# Patient Record
Sex: Male | Born: 1970 | Race: Black or African American | Hispanic: No | Marital: Single | Smoking: Never smoker
Health system: Southern US, Community
[De-identification: ages and names within clinical notes are randomized; demographics above are authoritative.]

## PROBLEM LIST (undated history)

## (undated) DIAGNOSIS — I255 Ischemic cardiomyopathy: Secondary | ICD-10-CM

## (undated) DIAGNOSIS — E785 Hyperlipidemia, unspecified: Secondary | ICD-10-CM

## (undated) DIAGNOSIS — I2510 Atherosclerotic heart disease of native coronary artery without angina pectoris: Secondary | ICD-10-CM

## (undated) DIAGNOSIS — R7301 Impaired fasting glucose: Secondary | ICD-10-CM

## (undated) DIAGNOSIS — I10 Essential (primary) hypertension: Secondary | ICD-10-CM

## (undated) DIAGNOSIS — R7303 Prediabetes: Secondary | ICD-10-CM

## (undated) HISTORY — PX: CARDIAC CATHETERIZATION: SHX172

## (undated) HISTORY — DX: Prediabetes: R73.03

---

## 2010-05-21 ENCOUNTER — Inpatient Hospital Stay (HOSPITAL_COMMUNITY)
Admission: EM | Admit: 2010-05-21 | Discharge: 2010-05-25 | DRG: 854 | Disposition: A | Discharge status: Home / Self Care | Payer: BC Managed Care – PPO | Attending: Cardiovascular Disease | Admitting: Cardiovascular Disease

## 2010-05-21 ENCOUNTER — Inpatient Hospital Stay (HOSPITAL_COMMUNITY): Payer: BC Managed Care – PPO

## 2010-05-21 DIAGNOSIS — R509 Fever, unspecified: Secondary | ICD-10-CM | POA: Diagnosis not present

## 2010-05-21 DIAGNOSIS — E785 Hyperlipidemia, unspecified: Secondary | ICD-10-CM | POA: Diagnosis present

## 2010-05-21 DIAGNOSIS — R7989 Other specified abnormal findings of blood chemistry: Secondary | ICD-10-CM | POA: Diagnosis present

## 2010-05-21 DIAGNOSIS — I2510 Atherosclerotic heart disease of native coronary artery without angina pectoris: Secondary | ICD-10-CM | POA: Diagnosis present

## 2010-05-21 DIAGNOSIS — I441 Atrioventricular block, second degree: Secondary | ICD-10-CM | POA: Diagnosis present

## 2010-05-21 DIAGNOSIS — I10 Essential (primary) hypertension: Secondary | ICD-10-CM | POA: Diagnosis present

## 2010-05-21 DIAGNOSIS — I2119 ST elevation (STEMI) myocardial infarction involving other coronary artery of inferior wall: Principal | ICD-10-CM | POA: Diagnosis present

## 2010-05-21 LAB — COMPREHENSIVE METABOLIC PANEL
ALT: 43 U/L (ref 0–53)
AST: 38 U/L — ABNORMAL HIGH (ref 0–37)
Albumin: 3.8 g/dL (ref 3.5–5.2)
Alkaline Phosphatase: 76 U/L (ref 39–117)
GFR calc Af Amer: >60 mL/min (ref >60)
Glucose, Bld: 166 mg/dL — ABNORMAL HIGH (ref 70–99)
Potassium: 3.1 mEq/L — ABNORMAL LOW (ref 3.5–5.1)
Sodium: 138 mEq/L (ref 135–145)
Total Protein: 6.7 g/dL (ref 6.0–8.3)

## 2010-05-21 LAB — MRSA PCR SCREENING: MRSA by PCR: NEGATIVE

## 2010-05-21 LAB — CBC
HCT: 43.5 % (ref 39.0–52.0)
Hemoglobin: 14.7 g/dL (ref 13.0–17.0)
MCHC: 33.8 g/dL (ref 30.0–36.0)

## 2010-05-21 LAB — POCT I-STAT, CHEM 8
Calcium, Ion: 1.04 mmol/L — ABNORMAL LOW (ref 1.12–1.32)
Chloride: 107 mEq/L (ref 96–112)
Glucose, Bld: 172 mg/dL — ABNORMAL HIGH (ref 70–99)
HCT: 46 % (ref 39.0–52.0)
Hemoglobin: 15.6 g/dL (ref 13.0–17.0)

## 2010-05-21 LAB — HEMOGLOBIN A1C
Hgb A1c MFr Bld: 5.9 % — ABNORMAL HIGH (ref <5.7)
Mean Plasma Glucose: 123 mg/dL — ABNORMAL HIGH (ref <117)

## 2010-05-21 LAB — POCT CARDIAC MARKERS: Troponin i, poc: <0.05 ng/mL (ref 0.00–0.09)

## 2010-05-21 LAB — PROTIME-INR: INR: 1.06 (ref 0.00–1.49)

## 2010-05-21 LAB — MAGNESIUM: Magnesium: 1.6 mg/dL (ref 1.5–2.5)

## 2010-05-21 LAB — CK TOTAL AND CKMB (NOT AT ARMC): Total CK: 1519 U/L — ABNORMAL HIGH (ref 7–232)

## 2010-05-22 LAB — CARDIAC PANEL(CRET KIN+CKTOT+MB+TROPI)
CK, MB: 233.7 ng/mL (ref 0.3–4.0)
Relative Index: 9.8 — ABNORMAL HIGH (ref 0.0–2.5)
Total CK: 4108 U/L — ABNORMAL HIGH (ref 7–232)
Total CK: 3234 U/L — ABNORMAL HIGH (ref 7–232)
Total CK: 2338 U/L — ABNORMAL HIGH (ref 7–232)
Troponin I: 20.13 ng/mL (ref <0.30)

## 2010-05-22 LAB — CBC
HCT: 39.3 % (ref 39.0–52.0)
Hemoglobin: 13.3 g/dL (ref 13.0–17.0)
MCH: 27.2 pg (ref 26.0–34.0)
MCHC: 33.8 g/dL (ref 30.0–36.0)
RBC: 4.89 MIL/uL (ref 4.22–5.81)

## 2010-05-22 LAB — BASIC METABOLIC PANEL
BUN: 11 mg/dL (ref 6–23)
Chloride: 106 mEq/L (ref 96–112)
Creatinine, Ser: 0.84 mg/dL (ref 0.4–1.5)
GFR calc Af Amer: >60 mL/min (ref >60)
GFR calc non Af Amer: >60 mL/min (ref >60)
Potassium: 4.8 mEq/L (ref 3.5–5.1)

## 2010-05-22 LAB — URINALYSIS, ROUTINE W REFLEX MICROSCOPIC
Bilirubin Urine: NEGATIVE
Ketones, ur: NEGATIVE mg/dL
Nitrite: NEGATIVE
Urobilinogen, UA: 0.2 mg/dL (ref 0.0–1.0)

## 2010-05-22 LAB — TSH: TSH: 1.135 u[IU]/mL (ref 0.350–4.500)

## 2010-05-22 LAB — PRO B NATRIURETIC PEPTIDE: Pro B Natriuretic peptide (BNP): 32.8 pg/mL (ref 0–125)

## 2010-05-22 LAB — LIPID PANEL
Cholesterol: 260 mg/dL — ABNORMAL HIGH (ref 0–200)
LDL Cholesterol: 194 mg/dL — ABNORMAL HIGH (ref 0–99)
Total CHOL/HDL Ratio: 5.7 RATIO

## 2010-05-22 LAB — POCT ACTIVATED CLOTTING TIME: Activated Clotting Time: 411 seconds

## 2010-05-23 ENCOUNTER — Inpatient Hospital Stay (HOSPITAL_COMMUNITY): Payer: BC Managed Care – PPO

## 2010-05-23 LAB — COMPREHENSIVE METABOLIC PANEL
ALT: 55 U/L — ABNORMAL HIGH (ref 0–53)
AST: 134 U/L — ABNORMAL HIGH (ref 0–37)
Albumin: 3.4 g/dL — ABNORMAL LOW (ref 3.5–5.2)
Alkaline Phosphatase: 42 U/L (ref 39–117)
Chloride: 104 mEq/L (ref 96–112)
GFR calc Af Amer: >60 mL/min (ref >60)
Potassium: 4 mEq/L (ref 3.5–5.1)
Total Bilirubin: 0.4 mg/dL (ref 0.3–1.2)

## 2010-05-23 LAB — URINALYSIS, ROUTINE W REFLEX MICROSCOPIC
Hgb urine dipstick: NEGATIVE
Protein, ur: NEGATIVE mg/dL
Urobilinogen, UA: 1 mg/dL (ref 0.0–1.0)

## 2010-05-23 LAB — CBC
HCT: 43.2 % (ref 39.0–52.0)
MCHC: 33.8 g/dL (ref 30.0–36.0)
RDW: 12.5 % (ref 11.5–15.5)

## 2010-05-23 LAB — DIFFERENTIAL
Basophils Absolute: 0 10*3/uL (ref 0.0–0.1)
Basophils Relative: 0 % (ref 0–1)
Eosinophils Relative: 1 % (ref 0–5)
Monocytes Absolute: 0.7 10*3/uL (ref 0.1–1.0)

## 2010-05-24 LAB — CBC
MCH: 27.2 pg (ref 26.0–34.0)
MCV: 79.2 fL (ref 78.0–100.0)
Platelets: 177 10*3/uL (ref 150–400)
RDW: 12.2 % (ref 11.5–15.5)
WBC: 6.2 10*3/uL (ref 4.0–10.5)

## 2010-05-24 LAB — URINE CULTURE

## 2010-05-24 NOTE — Cardiovascular Report (Signed)
NAME:  Russell, Baldwin NO.:  [id number]  MEDICAL RECORD NO.:  [id number]           PATIENT TYPE:  LOCATION:                                 FACILITY:  PHYSICIAN:  Nicki Guadalajara, M.D.     DATE OF BIRTH:  07-Aug-1970  DATE OF PROCEDURE: DATE OF DISCHARGE:                           CARDIAC CATHETERIZATION   PROCEDURE:  Emergent cardiac catheterization:  Cine coronary angiography; temporary pacemaker insertion; left ventriculography; thrombectomy, PCI, right coronary artery with PTCA and stenting.  INDICATIONS:  Russell Baldwin is a 40 year old African American gentleman, who was at his daughter's recital this afternoon when he developed new onset of left-sided chest pain.  He drove himself to the hospital.  Upon arrival, he became diaphoretic.  ECG showed marked inferior ST-segment elevation MI with 3-mm ST-segment elevation inferiorly with 3 mm ST-segment depression, T-wave inversion in lead I, aVL, V5 and V6.  He also had ST elevation in V3.  He developed heart block in the emergency room.  Code STEMI was called.  The patient was transported directly up to the cardiac catheterization laboratory, but had to wait until the code STEMI that had just been completed, had left the laboratory to bring this patient to the room.  Another Call team was also occupied with another acute coronary syndrome patient at the time as well.  Upon arrival to the catheterization laboratory, the patient was still having 10/10 chest pain.  He had received heparin 4000 units in the emergency room.  Right femoral artery was punctured anteriorly and a 6- French sheath was inserted.  Diagnostic catheterization, left coronary system was done with 6-French FL-4 diagnostic catheter.  A right guiding catheter 6-French was used for the injection into the right system. Temporary pacemaker was advanced to the RV apex due to the patient's heart block.  The patient had received Angiomax at the  start of the procedure and also 60 mg of Effient.  Asahi medium wire was able to cross the total proximal LAD occlusion.  Initially, there was TIMI zero flow.  Initial dilatation was done with 2.0 x 12-mm apex balloon.  There was thrombus present and export thrombectomy catheter was then inserted for clot aspiration.  A 2.5 x 20-mm apex balloon was then inserted. Since there was segmental disease beyond the initial stenosis and the vessel is a large vessel, decision was made to place a 3.5 x 24-mm DES Promus Element stent.  This was deployed x2 up to 11 atmospheres.  There was still a significant waist in the proximal portion of the stented segment where the vessel was initially totally occluded.  A 3.75 x 15-mm noncompliant tract was then used for post-stent dilatation. Preferential higher pressures was made at the area that is continued to have the waist.  This was dilated up to 11 atmospheres.  This was then removed and a 3.75 x 10-mm Dura Star Cordis noncompliant balloon was used for additional focal dilatation.  This was dilated up to 16 mm with ultimate opening up of the highly stenotic focal weaned narrowed area. Scout angiography confirmed an excellent angiographic result.  Attention was then directed  at the left ventriculography and a 6-French pigtail catheter was inserted.  Biplane cine left ventriculography was performed.  At the completion of the procedure, the patient was pain- free.  He had stable hemodynamics.  During the procedure, he also received several doses of intracoronary nitroglycerin, Versed plus fentanyl, in addition to his oral Effient and bivalve routing administration.  Integrilin was started at the completion of the procedure due to his initial thrombus burden and difficulty opening up the lesion to allow for longer more aggressive antiplatelet therapy initially.  Hemodynamic data:  Initial central aortic pressure was elevated at 180/120.  Following  completion of the intervention, the left ventricle pressure was 126/16, aortic pressure is 125/98, and the patient was on 30 mcg of intracoronary nitroglycerin.  Angiographic data:  Left main coronary artery was a long normal vessel which bifurcated into an LAD and left circumflex system.  The LAD was free of significant disease and gave rise to a proximal diagonal and septal perforating artery.  The circumflex vessel gave rise to marginal vessels.  There was free of significant stenoses.  The right coronary artery was totally occluded very proximally.  After the right coronary artery was opened with wire on initial PTCA, there was still 95 residual stenosis, and then after this, was clot burden with segmental stenoses of 50%.  Following successful thrombectomy, PTCA, stenting with a 3.5 x 24-mm DES Promus stent postdilated with a 3.75 x 15 and ultimate 3.75 x 10-mm noncompliant balloon, the entire region was reduced to 0%.  There was mild luminal irregularity of the distal RCA in the mid segment with narrowing of 20%.  The RCA was a large dominant vessel that supplied a large PDA and in the posterolateral system.  There was initial TIMI zero flow which improved to TIMI 3 flow.  The biplane cine left ventriculography revealed an ejection fraction of approximately 45% acutely.  There was mild to moderate hypocontractility in the inferior wall on the RAO projection and in the inferoapical-to- low posterolateral to mid posterolateral wall on the LAO projection.  Total balloon time 26 minutes.  IMPRESSION: 1. Acute inferior wall ST-segment elevation myocardial infarction     secondary to proximal RCA occlusion and a large dominant right     coronary artery. 2. Transient heart block. 3. Temporary pacemaker insertion. 4. Successful PCI of the right coronary artery, requiring PTCA,     thrombectomy, and ultimate stenting with a 3.5 x 24-mm DES stent     postdilated to 3.81 mm,  particularly at the very focally stenosed     segment proximally with resumption of TIMI 3 flow. 5. Total balloon time 26 minutes. 6. Effient/bivalve routing/heparin.          ______________________________ Nicki Guadalajara, M.D.     TK/MEDQ  D:  05/21/2010  T:  05/22/2010  Job:  [id number]  Electronically Signed by Nicki Guadalajara M.D. on 05/24/2010 02:15:52 PM

## 2010-05-24 NOTE — H&P (Signed)
NAME:  Russell Baldwin, Russell Baldwin NO.:  [id number]  MEDICAL RECORD NO.:  [id number]           PATIENT TYPE:  I  LOCATION:  2914                         FACILITY:  [facility]  PHYSICIAN:  Nicki Guadalajara, M.D.     DATE OF BIRTH:  September 04, 1970  DATE OF ADMISSION:  05/21/2010 DATE OF DISCHARGE:                             HISTORY & PHYSICAL   CHIEF COMPLAINT:  Chest pain.  HISTORY OF PRESENT ILLNESS:  A 40 year old African American male who was at his daughters' recital today and developed chest pain, midsternal, severe with nausea, diaphoresis, shortness of breath, drove himself to the emergency room.  He arrived here is at 1529, was found to be in acute ST elevation MI, inferior leads.  He was having 10/10 pain, was cold and clammy.  He was having high-degree AV block initially.  There was heart rate, but that was in the 60s.  He then went into sinus rhythm, slight sinus tach.  He continued with chest pain and was given a total of 8 mg of morphine in the ER and 325 mg of aspirin chewable.  He was given 4000 units of IV heparin, started on IV heparin drip as there was a patient in the cath lab and 4 mg of Zofran.  Then, he was given 40 mEq of potassium as the potassium level was 3.3.  He was then taken emergently to the cath lab for cardiac catheterization.  PAST MEDICAL HISTORY:  Negative for CVA, negative for diabetes, no hypertension, no heart disease.  FAMILY HISTORY:  His father is alive and well.  Mother died at 2 with heart disease.  SOCIAL HISTORY:  He has 2 children.  No tobacco, no alcohol use.  He is a Production designer, theatre/television/film at [employer].  ALLERGIES:  No known allergies.  OUTPATIENT MEDICATIONS:  None.  REVIEW OF SYSTEMS:  GENERAL:  No colds or fevers.  Skin:  No rashes or ulcers.  HEENT:  No blurred vision.  CARDIOVASCULAR:  No chest pain until today.  GI:  No diarrhea, constipation, or melena.  GU:  No hematuria or dysuria.  NEURO:  No syncope.  No  lightheadedness. MUSCULOSKELETAL:  No arthritic pains.  ENDOCRINE:  No diabetes or thyroid disease.  PHYSICAL EXAMINATION:  VITAL SIGNS:  Blood pressure 181/110, temperature was pending, pulse 71, respiratory rate is 18. GENERAL:  Alert, oriented, in obvious discomfort.  Writhing all over the bed.  Affect is pleasant, but obviously uncomfortable. SKIN:  Cold and clammy.  Sheets are wet with diaphoresis. HEENT:  Normocephalic.  Sclera clear. NECK:  Supple.  He does have JVD. LUNGS:  Clear now without rales, rhonchi, or wheezes. HEART:  Sounds S1 and S2.  Regular rate and rhythm.  No obvious murmur. ABDOMEN:  Soft, nontender.  Positive bowel sounds.  Do not palpate liver, spleen, or masses. LOWER EXTREMITIES:  Without edema, 2+ pedals bilaterally. NEUROLOGIC:  Alert, oriented x3.  LABORATORY DATA:  Sodium 139, potassium 3.3, BUN 60, creatinine 1.30, and glucose 172.  Hemoglobin 15.6, hematocrit 46.  EKG with ST elevation in II, III, and aVF and in V3 he has reciprocal changes in II,  IV, V and VI.  IMPRESSION: 1. Hypertension, uncontrolled. 2. Atrioventricular block, transient.  PLAN OF ACTION:  Emergently to cath lab, the patient aware of procedure. His father is aware that he is here at [facility] with chest pain.  He did not want me to call anyone else.     Darcella Gasman. Annie Paras, N.P.   ______________________________ Nicki Guadalajara, M.D.    LRI/MEDQ  D:  05/21/2010  T:  05/22/2010  Job:  [id number]  Electronically Signed by Nada Boozer N.P. on 05/23/2010 06:53:45 PM Electronically Signed by Nicki Guadalajara M.D. on 05/24/2010 02:15:45 PM

## 2010-05-25 LAB — COMPREHENSIVE METABOLIC PANEL
ALT: 40 U/L (ref 0–53)
Alkaline Phosphatase: 44 U/L (ref 39–117)
CO2: 28 mEq/L (ref 19–32)
Chloride: 99 mEq/L (ref 96–112)
GFR calc non Af Amer: >60 mL/min (ref >60)
Glucose, Bld: 120 mg/dL — ABNORMAL HIGH (ref 70–99)
Potassium: 3.9 mEq/L (ref 3.5–5.1)
Sodium: 135 mEq/L (ref 135–145)

## 2010-05-29 LAB — CULTURE, BLOOD (ROUTINE X 2)
Culture  Setup Time: 201205220834
Culture  Setup Time: 201205220834
Culture: NO GROWTH

## 2010-06-12 ENCOUNTER — Encounter (HOSPITAL_COMMUNITY): Payer: BC Managed Care – PPO

## 2010-06-14 ENCOUNTER — Encounter (HOSPITAL_COMMUNITY): Payer: BC Managed Care – PPO

## 2010-06-15 NOTE — Discharge Summary (Signed)
NAME:  Russell Baldwin, Russell Baldwin NO.:  [id number]  MEDICAL RECORD NO.:  [id number]           PATIENT TYPE:  I  LOCATION:  4732                         FACILITY:  [facility]  PHYSICIAN:  Nicki Guadalajara, M.D.     DATE OF BIRTH:  Jul 04, 1970  DATE OF ADMISSION:  05/21/2010 DATE OF DISCHARGE:  05/25/2010                              DISCHARGE SUMMARY   DISCHARGE DIAGNOSES: 1. ST-elevation myocardial infarction at the inferior wall with total     occlusion of the right coronary artery at the proximal portion     undergoing rescue percutaneous transluminal coronary angioplasty     and stent deployment with a PROMUS drug-eluting stent.     a.     Residual 20% stenosis in the right coronary artery distal to      the stent and normal left circumflex and normal left anterior      descending artery. 2. Left ventricular dysfunction with ejection fraction 45-50%. 3. Hypertension uncontrolled on admission, improved at discharge. 4. Transvenous atrioventricular block with presentation in the     emergency room, resolved. 5. Fever post myocardial infarction with negative urinalysis, negative     blood cultures and negative chest x-ray. 6. Dyslipidemia.  Plan for outpatient [place] advanced lipid panel. 7. Abnormal LFTs, though improved at discharge.  DISCHARGE CONDITION:  Improved.  PROCEDURES:  Emergent cardiac catheterization May 21, 2010.  May 21, 2010, percutaneous transluminal coronary angioplasty and stent deployment rescue for totally occluded proximal right coronary artery stenosis successful with a PROMUS drug-eluting stent done by Dr. Tresa Endo.  Please note total bleeding time was 26 minutes.  DISCHARGE INSTRUCTIONS: 1. Increase activity slowly.  May shower.  No lifting for 3 weeks.  No     driving for 2 weeks.  No sexual activity for 2 weeks. 2. Low-sodium heart-healthy diet. 3. Wash cath site with soap and water.  Call if any bleeding, swelling     or drainage. 4. Follow up  with Dr. Royann Shivers June 08, 2010, at 3 p.m., Dr. Landry Dyke     partner. 5. No work until you are cleared by Dr. Royann Shivers.  DISCHARGE MEDICATIONS:  See medication reconciliation sheet but all new medications aspirin, metoprolol, beta blocker, nitroglycerin p.r.n., Protonix, Effient, ramipril and Crestor.  HOSPITAL COURSE:  This 40 year old African American male who was at his daughters' recital on May 21, 2010, developed chest pain, midsternal severe with nausea, diaphoresis, shortness of breath drove himself to the emergency room was found to be in acute ST elevation MI in his inferior leads.  He was having 10/10 pain was cold and clammy with high degree AV block initially.  Heart rate was stable in the 60s.  He was hypertensive.  He was given 325 of aspirin 4000 IV heparin drip and 4 mg of Zofran along with a total of 8 mg of IV morphine as well as nitroglycerin drip was started and was titrated up to 20 mcg per minute. Also his potassium was 3.3 and he was given 40 mEq of potassium and was then transported emergently to the Cath Lab.  PAST MEDICAL HISTORY:  Negative for CVA, negative  for diabetes.  No history of hypertension or heart disease.  In the cath lab, he was found to have a 100% total right coronary artery and underwent PTCA and stent deployment with a drug-eluting PROMUS stent.  He tolerated the procedure well and was admitted to the coronary intensive care unit.  The patient's potassium and magnesium were low and he was given potassium and magnesium supplementation.  By the next morning, his troponin was greater than 25 and his CK was 4108 and his MB was 402.  He continued on Integrilin and nitroglycerin drip.  We had already started the Lopressor and the Altace.  He continued to improve. His Altace was increased, had no complaints.  Did develop fever.  Chest x-ray was done, UA all were clear.  Cardiac rehab was called and he was ambulated without complications.   Nitroglycerin etc. was reviewed with the patient as well.  By May 24, 2010, he was ambulating without complications.  Blood cultures were negative from his fever.  His pulse was in the 90s.  His beta-blocker was increased to 50 twice a day but he had no further blocks of any type on his rhythm.  He continued to be stable.  His EKG initially had ST elevation inferior leads now with evolutionary changes of an inferior MI.  Continues to ambulate well with cardiac rehab with stable blood pressure.  On May 25, 2010, blood pressure 131/80, pulse 64, respirations 20, temperature 98.5, SPO2 95% on room air.  Heart regular rate and rhythm.  Lungs were clear and extremities without edema.  Dr. Clarene Duke discussed limited activity x3 weeks after MI, importance of his medications especially his statin and his Effient.  LABORATORY DATA:  Hemoglobin was stable throughout hospitalization at discharge 14.3, hematocrit 41.6, WBC 6.2 and platelets 177, neutrophils 66, lymphs 23, mono 10.  Pro time on admission 14, INR of 1.06, PTT 31.  Chemistry at discharge sodium 135, potassium 3.9, chloride 99, CO2 28, glucose was 120, it has been up and down during hospitalization high of 166, BUN 16, creatinine 0.99.  Total bili 0.5, alkaline phos 44, SGOT 41 down from 134, SGPT 40 down from 55, total protein 7, albumin 3.5 and calcium 9.5.  These remained stable.  Magnesium level 1.6.  Please note, hemoglobin A1c was 5.9.  Cardiac enzymes initial CK was 1519 with MB of 37, peaked CK of 4108 with an MB of 402.3 and a troponin of greater than 25.  Third CK was 3234 with an MB of 233, troponin greater than 25 and the fourth CK was coming down to 2338 with an MB of 115.7 and a troponin of 20.13.  Total cholesterol was 260, triglycerides 99, HDL 46 and LDL 194.  TSH 1.135.  UA was clear on admission with a moderate amount of blood and followup UA was totally clear.  MRSA screen was negative.  RADIOLOGY:  Initial chest  x-ray no acute cardiopulmonary process seen in follow-up on May 23, 2010, due to sudden onset of fever, mild vascular congestion.  Lungs remained clear.  EKGs:  Initial EKG with ST elevation of 3 mm in II, III and aVF and reciprocal changes in lead I, III, IV, V and VI.  He also had ST elevation in V3.  Second EKG after procedure revealed Q-waves in leads II, III and aVF but no ST elevation and improvement of the reciprocal changes, then on May 22, 2010, continued Q-waves in his inferior leads and no acute changes.  By May 23, 2010, a deep T-wave inversions in II, III, aVF, evolutionary changes of the previous acute inferior MI.  The patient is stable and ready for discharge on May 25, 2010, and will follow up as instructed.     Darcella Gasman. Annie Paras, N.P.   ______________________________ Nicki Guadalajara, M.D.    LRI/MEDQ  D:  05/25/2010  T:  05/26/2010  Job:  [id number]  Electronically Signed by Nada Boozer N.P. on 05/31/2010 04:42:46 PM Electronically Signed by Nicki Guadalajara M.D. on 06/15/2010 04:11:49 PM

## 2010-06-16 ENCOUNTER — Encounter (HOSPITAL_COMMUNITY): Payer: BC Managed Care – PPO | Attending: Cardiovascular Disease

## 2010-06-16 DIAGNOSIS — I441 Atrioventricular block, second degree: Secondary | ICD-10-CM | POA: Insufficient documentation

## 2010-06-16 DIAGNOSIS — E785 Hyperlipidemia, unspecified: Secondary | ICD-10-CM | POA: Insufficient documentation

## 2010-06-16 DIAGNOSIS — Z5189 Encounter for other specified aftercare: Secondary | ICD-10-CM | POA: Insufficient documentation

## 2010-06-16 DIAGNOSIS — I10 Essential (primary) hypertension: Secondary | ICD-10-CM | POA: Insufficient documentation

## 2010-06-16 DIAGNOSIS — Z9861 Coronary angioplasty status: Secondary | ICD-10-CM | POA: Insufficient documentation

## 2010-06-16 DIAGNOSIS — I2510 Atherosclerotic heart disease of native coronary artery without angina pectoris: Secondary | ICD-10-CM | POA: Insufficient documentation

## 2010-06-16 DIAGNOSIS — I2119 ST elevation (STEMI) myocardial infarction involving other coronary artery of inferior wall: Secondary | ICD-10-CM | POA: Insufficient documentation

## 2010-06-19 ENCOUNTER — Encounter (HOSPITAL_COMMUNITY): Payer: BC Managed Care – PPO

## 2010-06-21 ENCOUNTER — Encounter (HOSPITAL_COMMUNITY): Payer: BC Managed Care – PPO

## 2010-06-23 ENCOUNTER — Encounter (HOSPITAL_COMMUNITY): Payer: BC Managed Care – PPO

## 2010-06-26 ENCOUNTER — Encounter (HOSPITAL_COMMUNITY): Payer: BC Managed Care – PPO

## 2010-06-28 ENCOUNTER — Encounter (HOSPITAL_COMMUNITY): Payer: BC Managed Care – PPO

## 2010-06-30 ENCOUNTER — Encounter (HOSPITAL_COMMUNITY): Payer: BC Managed Care – PPO

## 2010-07-03 ENCOUNTER — Encounter (HOSPITAL_COMMUNITY): Payer: BC Managed Care – PPO

## 2010-07-05 ENCOUNTER — Encounter (HOSPITAL_COMMUNITY): Payer: BC Managed Care – PPO

## 2010-07-07 ENCOUNTER — Encounter (HOSPITAL_COMMUNITY): Payer: BC Managed Care – PPO

## 2010-07-10 ENCOUNTER — Encounter (HOSPITAL_COMMUNITY): Payer: BC Managed Care – PPO

## 2010-07-12 ENCOUNTER — Encounter (HOSPITAL_COMMUNITY): Payer: BC Managed Care – PPO

## 2010-07-14 ENCOUNTER — Encounter (HOSPITAL_COMMUNITY): Payer: BC Managed Care – PPO

## 2010-07-17 ENCOUNTER — Encounter (HOSPITAL_COMMUNITY): Payer: BC Managed Care – PPO

## 2010-07-19 ENCOUNTER — Encounter (HOSPITAL_COMMUNITY): Payer: BC Managed Care – PPO

## 2010-07-21 ENCOUNTER — Encounter (HOSPITAL_COMMUNITY): Payer: BC Managed Care – PPO

## 2010-07-24 ENCOUNTER — Encounter (HOSPITAL_COMMUNITY): Payer: BC Managed Care – PPO

## 2010-07-26 ENCOUNTER — Encounter (HOSPITAL_COMMUNITY): Payer: BC Managed Care – PPO

## 2010-07-28 ENCOUNTER — Encounter (HOSPITAL_COMMUNITY): Payer: BC Managed Care – PPO

## 2010-07-31 ENCOUNTER — Encounter (HOSPITAL_COMMUNITY): Payer: BC Managed Care – PPO

## 2010-08-02 ENCOUNTER — Encounter (HOSPITAL_COMMUNITY): Payer: BC Managed Care – PPO

## 2010-08-04 ENCOUNTER — Encounter (HOSPITAL_COMMUNITY): Payer: BC Managed Care – PPO

## 2010-08-07 ENCOUNTER — Encounter (HOSPITAL_COMMUNITY): Payer: BC Managed Care – PPO

## 2010-08-09 ENCOUNTER — Encounter (HOSPITAL_COMMUNITY): Payer: BC Managed Care – PPO

## 2010-08-11 ENCOUNTER — Encounter (HOSPITAL_COMMUNITY): Payer: BC Managed Care – PPO

## 2010-08-14 ENCOUNTER — Encounter (HOSPITAL_COMMUNITY): Payer: BC Managed Care – PPO

## 2010-08-16 ENCOUNTER — Encounter (HOSPITAL_COMMUNITY): Payer: BC Managed Care – PPO

## 2010-08-18 ENCOUNTER — Encounter (HOSPITAL_COMMUNITY): Payer: BC Managed Care – PPO

## 2010-08-21 ENCOUNTER — Encounter (HOSPITAL_COMMUNITY): Payer: BC Managed Care – PPO

## 2010-08-23 ENCOUNTER — Encounter (HOSPITAL_COMMUNITY): Payer: BC Managed Care – PPO

## 2010-08-25 ENCOUNTER — Encounter (HOSPITAL_COMMUNITY): Payer: BC Managed Care – PPO

## 2010-08-28 ENCOUNTER — Encounter (HOSPITAL_COMMUNITY): Payer: BC Managed Care – PPO

## 2010-08-30 ENCOUNTER — Encounter (HOSPITAL_COMMUNITY): Payer: BC Managed Care – PPO

## 2010-09-01 ENCOUNTER — Encounter (HOSPITAL_COMMUNITY): Payer: BC Managed Care – PPO

## 2010-09-04 ENCOUNTER — Encounter (HOSPITAL_COMMUNITY): Payer: BC Managed Care – PPO

## 2010-09-06 ENCOUNTER — Encounter (HOSPITAL_COMMUNITY): Payer: BC Managed Care – PPO

## 2010-09-08 ENCOUNTER — Encounter (HOSPITAL_COMMUNITY): Payer: BC Managed Care – PPO

## 2010-09-11 ENCOUNTER — Encounter (HOSPITAL_COMMUNITY): Payer: BC Managed Care – PPO

## 2010-09-13 ENCOUNTER — Encounter (HOSPITAL_COMMUNITY): Payer: BC Managed Care – PPO

## 2010-09-15 ENCOUNTER — Encounter (HOSPITAL_COMMUNITY): Payer: BC Managed Care – PPO

## 2012-08-27 ENCOUNTER — Other Ambulatory Visit (HOSPITAL_COMMUNITY)
Admission: RE | Admit: 2012-08-27 | Discharge: 2012-08-27 | Disposition: A | Discharge status: Home / Self Care | Payer: PRIVATE HEALTH INSURANCE | Source: Ambulatory Visit | Attending: Emergency Medicine | Admitting: Emergency Medicine

## 2012-08-27 ENCOUNTER — Emergency Department (HOSPITAL_COMMUNITY)
Admission: EM | Admit: 2012-08-27 | Discharge: 2012-08-27 | Disposition: A | Discharge status: Home / Self Care | Payer: PRIVATE HEALTH INSURANCE | Source: Home / Self Care | Attending: Emergency Medicine | Admitting: Emergency Medicine

## 2012-08-27 ENCOUNTER — Encounter (HOSPITAL_COMMUNITY): Payer: Self-pay | Admitting: Emergency Medicine

## 2012-08-27 DIAGNOSIS — R1032 Left lower quadrant pain: Secondary | ICD-10-CM

## 2012-08-27 DIAGNOSIS — Z113 Encounter for screening for infections with a predominantly sexual mode of transmission: Secondary | ICD-10-CM | POA: Insufficient documentation

## 2012-08-27 HISTORY — DX: Atherosclerotic heart disease of native coronary artery without angina pectoris: I25.10

## 2012-08-27 LAB — POCT URINALYSIS DIP (DEVICE)
Glucose, UA: NEGATIVE mg/dL
Nitrite: NEGATIVE
Protein, ur: 30 mg/dL — AB
Urobilinogen, UA: 0.2 mg/dL (ref 0.0–1.0)

## 2012-08-27 MED ORDER — TRAMADOL HCL 50 MG PO TABS: 50.0000 mg | ORAL_TABLET | Freq: Four times a day (QID) | ORAL | Status: DC | PRN

## 2012-08-27 NOTE — ED Notes (Signed)
Reports left groin pain that started Monday, Monday and Tuesday patient reports urination seemed to relieve pressure and discomfort.  Then Tuesday evening, penile pain with urination.  Now the only pain is groin pain.  No blood ever seen.

## 2012-08-27 NOTE — ED Provider Notes (Signed)
CSN: [id number]     Arrival date & time 08/27/12  1711 History   First MD Initiated Contact with Patient 08/27/12 1745     Chief Complaint  Patient presents with  . Groin Pain   (Consider location/radiation/quality/duration/timing/severity/associated sxs/prior Treatment) HPI Comments: She presents urgent care this evening complaining of left groin pain and discomfort. He describes that Monday started experiencing discomfort with urination as it seemed to cause him to have discomfort on his left inguinal region. His urination discomforts has since then improved to the point that today while providing [place] a urine sample patient said did not feel any discomfort or have not perceive any abnormalities with his urine. He denies any nausea vomiting fevers or flank pain. Patient denies having had a history of kidney or ureteral stones.  Patient denies any recent trauma injury or increased physical activity. Denies any nausea vomiting or fevers.  Patient has no concerns of having been exposed to any sexually transmitted illness and denies any testicular pain or penile discharge.    Patient is a 42 y.o. male presenting with groin pain. The history is provided by the patient.  Groin Pain This is a new problem. The current episode started 2 days ago. The problem occurs constantly. The problem has not changed since onset.Pertinent negatives include no chest pain, no abdominal pain, no headaches and no shortness of breath. Nothing aggravates the symptoms. He has tried nothing for the symptoms.    Past Medical History  Diagnosis Date  . Coronary artery disease    Past Surgical History  Procedure Laterality Date  . Cardiac catheterization     No family history on file. History  Substance Use Topics  . Smoking status: Never Smoker   . Smokeless tobacco: Not on file  . Alcohol Use: Yes    Review of Systems  Constitutional: Positive for activity change. Negative for fever, fatigue and unexpected  weight change.  Respiratory: Negative for shortness of breath.   Cardiovascular: Negative for chest pain, palpitations and leg swelling.  Gastrointestinal: Negative for nausea, vomiting, abdominal pain and diarrhea.  Genitourinary: Positive for dysuria and difficulty urinating. Negative for frequency, hematuria, flank pain, discharge, penile swelling, enuresis, genital sores and penile pain.  Skin: Negative for color change, rash and wound.  Neurological: Negative for headaches.    Allergies  Review of patient's allergies indicates no known allergies.  Home Medications   Current Outpatient Rx  Name  Route  Sig  Dispense  Refill  . traMADol (ULTRAM) 50 MG tablet   Oral   Take 1 tablet (50 mg total) by mouth every 6 (six) hours as needed for pain.   15 tablet   0    BP 146/95  Pulse 88  Temp(Src) 98.5 F (36.9 C) (Oral)  Resp 20  SpO2 96% Physical Exam  Nursing note and vitals reviewed. Constitutional: Vital signs are normal. He appears well-developed and well-nourished.  Non-toxic appearance. He does not have a sickly appearance. He does not appear ill. No distress.  HENT:  Head: Normocephalic.  Eyes: No scleral icterus.  Neck: Neck supple.  Cardiovascular: Normal rate.  Exam reveals no gallop and no friction rub.   No murmur heard. Pulmonary/Chest: Effort normal and breath sounds normal.  Abdominal: Soft. He exhibits no distension and no mass. There is no tenderness. There is no rebound and no guarding. Hernia confirmed negative in the right inguinal area and confirmed negative in the left inguinal area.  Genitourinary: Testes normal and penis normal.  Right testis shows no tenderness. Left testis shows no tenderness. No penile tenderness. No discharge found.  Lymphadenopathy:       Right: No inguinal adenopathy present.       Left: No inguinal adenopathy present.  Skin: No erythema.    ED Course  Procedures (including critical care time) Labs Review Labs  Reviewed  POCT URINALYSIS DIP (DEVICE) - Abnormal; Notable for the following:    Protein, ur 30 (*)    All other components within normal limits  URINE CYTOLOGY ANCILLARY ONLY   Imaging Review No results found.  MDM  Non-trauma inguinal discomfort  Physical exam was somewhat unremarkable was unable to recreate any abdominal pain nor inguinal pain. Exam was also not suggestive of avascular problem as patient had palpable femoral popliteal and dorsalis pedis. Patient reported recent result urinary symptoms. Point-of-care urinalysis was unremarkable including no hematuria. A further request have been done to screen for STDs 3 urine cytology sample.  Left inguinal inner aspect of thigh- discomfort of unknown etiology at this point. Have encouraged patient to followup with his primary care Dr. or [place] if no improvement or changes in we discuss symptoms that should warrant further evaluation in the emergency department.   Discharge Medication List as of 08/27/2012  6:41 PM    START taking these medications   Details  traMADol (ULTRAM) 50 MG tablet Take 1 tablet (50 mg total) by mouth every 6 (six) hours as needed for pain., Starting 08/27/2012, Until Discontinued, Normal        Jimmie Molly, MD 08/27/12 [phone]

## 2014-03-21 ENCOUNTER — Encounter (HOSPITAL_COMMUNITY)
Admission: EM | Disposition: A | Discharge status: Home / Self Care | Payer: PRIVATE HEALTH INSURANCE | Source: Home / Self Care | Attending: Cardiovascular Disease

## 2014-03-21 ENCOUNTER — Other Ambulatory Visit (HOSPITAL_COMMUNITY): Payer: Self-pay

## 2014-03-21 ENCOUNTER — Inpatient Hospital Stay (HOSPITAL_COMMUNITY)
Admission: EM | Admit: 2014-03-21 | Discharge: 2014-03-23 | DRG: 246 | Disposition: A | Discharge status: Home / Self Care | Payer: PRIVATE HEALTH INSURANCE | Attending: Cardiovascular Disease | Admitting: Cardiovascular Disease

## 2014-03-21 ENCOUNTER — Encounter (HOSPITAL_COMMUNITY): Payer: Self-pay | Admitting: *Deleted

## 2014-03-21 DIAGNOSIS — I2119 ST elevation (STEMI) myocardial infarction involving other coronary artery of inferior wall: Secondary | ICD-10-CM | POA: Diagnosis present

## 2014-03-21 DIAGNOSIS — Y840 Cardiac catheterization as the cause of abnormal reaction of the patient, or of later complication, without mention of misadventure at the time of the procedure: Secondary | ICD-10-CM | POA: Diagnosis present

## 2014-03-21 DIAGNOSIS — I2111 ST elevation (STEMI) myocardial infarction involving right coronary artery: Secondary | ICD-10-CM

## 2014-03-21 DIAGNOSIS — I252 Old myocardial infarction: Secondary | ICD-10-CM | POA: Diagnosis not present

## 2014-03-21 DIAGNOSIS — E785 Hyperlipidemia, unspecified: Secondary | ICD-10-CM | POA: Diagnosis present

## 2014-03-21 DIAGNOSIS — I221 Subsequent ST elevation (STEMI) myocardial infarction of inferior wall: Secondary | ICD-10-CM | POA: Diagnosis not present

## 2014-03-21 DIAGNOSIS — I2510 Atherosclerotic heart disease of native coronary artery without angina pectoris: Secondary | ICD-10-CM | POA: Diagnosis present

## 2014-03-21 DIAGNOSIS — I255 Ischemic cardiomyopathy: Secondary | ICD-10-CM | POA: Diagnosis present

## 2014-03-21 DIAGNOSIS — I213 ST elevation (STEMI) myocardial infarction of unspecified site: Secondary | ICD-10-CM

## 2014-03-21 DIAGNOSIS — Z9114 Patient's other noncompliance with medication regimen: Secondary | ICD-10-CM | POA: Diagnosis present

## 2014-03-21 DIAGNOSIS — R0789 Other chest pain: Secondary | ICD-10-CM | POA: Diagnosis present

## 2014-03-21 DIAGNOSIS — T82858A Stenosis of vascular prosthetic devices, implants and grafts, initial encounter: Principal | ICD-10-CM | POA: Diagnosis present

## 2014-03-21 DIAGNOSIS — R7301 Impaired fasting glucose: Secondary | ICD-10-CM | POA: Diagnosis present

## 2014-03-21 DIAGNOSIS — I10 Essential (primary) hypertension: Secondary | ICD-10-CM | POA: Diagnosis present

## 2014-03-21 DIAGNOSIS — R7303 Prediabetes: Secondary | ICD-10-CM | POA: Diagnosis present

## 2014-03-21 HISTORY — DX: Impaired fasting glucose: R73.01

## 2014-03-21 HISTORY — PX: LEFT HEART CATH: SHX5478

## 2014-03-21 HISTORY — DX: Essential (primary) hypertension: I10

## 2014-03-21 HISTORY — DX: Hyperlipidemia, unspecified: E78.5

## 2014-03-21 HISTORY — DX: Ischemic cardiomyopathy: I25.5

## 2014-03-21 LAB — RAPID URINE DRUG SCREEN, HOSP PERFORMED
Amphetamines: NOT DETECTED
BARBITURATES: NOT DETECTED
BENZODIAZEPINES: NOT DETECTED
COCAINE: NOT DETECTED
Opiates: POSITIVE — AB
Tetrahydrocannabinol: NOT DETECTED

## 2014-03-21 LAB — CBC
HCT: 41.9 % (ref 39.0–52.0)
HEMATOCRIT: 43.8 % (ref 39.0–52.0)
HEMATOCRIT: 41.1 % (ref 39.0–52.0)
HEMOGLOBIN: 13.5 g/dL (ref 13.0–17.0)
Hemoglobin: 14.6 g/dL (ref 13.0–17.0)
Hemoglobin: 13.8 g/dL (ref 13.0–17.0)
MCH: 26.8 pg (ref 26.0–34.0)
MCH: 26.4 pg (ref 26.0–34.0)
MCH: 26.7 pg (ref 26.0–34.0)
MCHC: 33.3 g/dL (ref 30.0–36.0)
MCHC: 32.8 g/dL (ref 30.0–36.0)
MCHC: 32.9 g/dL (ref 30.0–36.0)
MCV: 80.5 fL (ref 78.0–100.0)
MCV: 80.4 fL (ref 78.0–100.0)
MCV: 81.2 fL (ref 78.0–100.0)
PLATELETS: 180 10*3/uL (ref 150–400)
Platelets: 246 10*3/uL (ref 150–400)
Platelets: 192 10*3/uL (ref 150–400)
RBC: 5.44 MIL/uL (ref 4.22–5.81)
RBC: 5.11 MIL/uL (ref 4.22–5.81)
RBC: 5.16 MIL/uL (ref 4.22–5.81)
RDW: 13.2 % (ref 11.5–15.5)
RDW: 13.1 % (ref 11.5–15.5)
RDW: 13.2 % (ref 11.5–15.5)
WBC: 7.9 10*3/uL (ref 4.0–10.5)
WBC: 5.1 10*3/uL (ref 4.0–10.5)
WBC: 5.1 10*3/uL (ref 4.0–10.5)

## 2014-03-21 LAB — BASIC METABOLIC PANEL
ANION GAP: 6 (ref 5–15)
BUN: 8 mg/dL (ref 6–23)
CO2: 28 mmol/L (ref 19–32)
CREATININE: 1.01 mg/dL (ref 0.50–1.35)
Calcium: 8.7 mg/dL (ref 8.4–10.5)
Chloride: 103 mmol/L (ref 96–112)
GFR calc Af Amer: >90 mL/min (ref >90)
Glucose, Bld: 113 mg/dL — ABNORMAL HIGH (ref 70–99)
Potassium: 4.1 mmol/L (ref 3.5–5.1)
Sodium: 137 mmol/L (ref 135–145)

## 2014-03-21 LAB — COMPREHENSIVE METABOLIC PANEL
ALT: 49 U/L (ref 0–53)
AST: 96 U/L — ABNORMAL HIGH (ref 0–37)
Albumin: 4.4 g/dL (ref 3.5–5.2)
Alkaline Phosphatase: 64 U/L (ref 39–117)
Anion gap: 11 (ref 5–15)
BILIRUBIN TOTAL: 0.3 mg/dL (ref 0.3–1.2)
BUN: 8 mg/dL (ref 6–23)
CALCIUM: 9 mg/dL (ref 8.4–10.5)
CHLORIDE: 101 mmol/L (ref 96–112)
CO2: 24 mmol/L (ref 19–32)
Creatinine, Ser: 1.24 mg/dL (ref 0.50–1.35)
GFR, EST AFRICAN AMERICAN: 81 mL/min — AB (ref >90)
GFR, EST NON AFRICAN AMERICAN: 70 mL/min — AB (ref >90)
Glucose, Bld: 129 mg/dL — ABNORMAL HIGH (ref 70–99)
POTASSIUM: 4.2 mmol/L (ref 3.5–5.1)
Sodium: 136 mmol/L (ref 135–145)
TOTAL PROTEIN: 6.4 g/dL (ref 6.0–8.3)

## 2014-03-21 LAB — PROTIME-INR
INR: 0.98 (ref 0.00–1.49)
PROTHROMBIN TIME: 13.1 s (ref 11.6–15.2)

## 2014-03-21 LAB — TROPONIN I
TROPONIN I: 4.54 ng/mL — AB (ref <0.031)
Troponin I: 5.95 ng/mL (ref <0.031)
Troponin I: 12.41 ng/mL (ref <0.031)

## 2014-03-21 LAB — APTT: APTT: 23 s — AB (ref 24–37)

## 2014-03-21 LAB — MRSA PCR SCREENING: MRSA by PCR: NEGATIVE

## 2014-03-21 LAB — I-STAT TROPONIN, ED: TROPONIN I, POC: 0.2 ng/mL — AB (ref 0.00–0.08)

## 2014-03-21 SURGERY — LEFT HEART CATH
Anesthesia: LOCAL

## 2014-03-21 MED ORDER — BIVALIRUDIN 250 MG IV SOLR
INTRAVENOUS | Status: AC
  Filled 2014-03-21: qty 250

## 2014-03-21 MED ORDER — MORPHINE SULFATE 2 MG/ML IJ SOLN: 2.0000 mg | INTRAMUSCULAR | Status: DC | PRN

## 2014-03-21 MED ORDER — MORPHINE SULFATE 4 MG/ML IJ SOLN
INTRAMUSCULAR | Status: AC
  Filled 2014-03-21: qty 1

## 2014-03-21 MED ORDER — ATROPINE SULFATE 0.1 MG/ML IJ SOLN
INTRAMUSCULAR | Status: AC
  Filled 2014-03-21: qty 10

## 2014-03-21 MED ORDER — ACETAMINOPHEN 325 MG PO TABS: 650.0000 mg | ORAL_TABLET | ORAL | Status: DC | PRN

## 2014-03-21 MED ORDER — LIDOCAINE HCL (PF) 1 % IJ SOLN
INTRAMUSCULAR | Status: AC
  Filled 2014-03-21: qty 30

## 2014-03-21 MED ORDER — TICAGRELOR 90 MG PO TABS
90.0000 mg | ORAL_TABLET | Freq: Two times a day (BID) | ORAL | Status: DC
  Administered 2014-03-21 – 2014-03-23 (×5): 90 mg via ORAL
  Filled 2014-03-21 (×6): qty 1

## 2014-03-21 MED ORDER — ONDANSETRON HCL 4 MG/2ML IJ SOLN
INTRAMUSCULAR | Status: AC
  Filled 2014-03-21: qty 2

## 2014-03-21 MED ORDER — HYDRALAZINE HCL 20 MG/ML IJ SOLN
10.0000 mg | INTRAMUSCULAR | Status: DC | PRN
  Administered 2014-03-21 (×2): 10 mg via INTRAVENOUS
  Filled 2014-03-21 (×2): qty 1

## 2014-03-21 MED ORDER — SODIUM CHLORIDE 0.9 % IV SOLN
INTRAVENOUS | Status: DC
  Administered 2014-03-21: 02:00:00 via INTRAVENOUS

## 2014-03-21 MED ORDER — TRAMADOL HCL 50 MG PO TABS: 50.0000 mg | ORAL_TABLET | Freq: Four times a day (QID) | ORAL | Status: DC | PRN

## 2014-03-21 MED ORDER — MORPHINE SULFATE 4 MG/ML IJ SOLN
4.0000 mg | Freq: Once | INTRAMUSCULAR | Status: AC
  Administered 2014-03-21: 4 mg via INTRAVENOUS

## 2014-03-21 MED ORDER — HEPARIN SODIUM (PORCINE) 5000 UNIT/ML IJ SOLN
4000.0000 [IU] | INTRAMUSCULAR | Status: AC
  Administered 2014-03-21: 4000 [IU] via INTRAVENOUS

## 2014-03-21 MED ORDER — SODIUM CHLORIDE 0.9 % IV SOLN
0.2500 mg/kg/h | INTRAVENOUS | Status: AC
  Administered 2014-03-21: 0.25 mg/kg/h via INTRAVENOUS

## 2014-03-21 MED ORDER — ISOSORBIDE MONONITRATE ER 30 MG PO TB24
30.0000 mg | ORAL_TABLET | Freq: Every day | ORAL | Status: DC
  Administered 2014-03-21 – 2014-03-23 (×3): 30 mg via ORAL
  Filled 2014-03-21 (×3): qty 1

## 2014-03-21 MED ORDER — ONDANSETRON HCL 4 MG/2ML IJ SOLN
INTRAMUSCULAR | Status: AC
  Administered 2014-03-21: 4 mg via INTRAVENOUS
  Filled 2014-03-21: qty 2

## 2014-03-21 MED ORDER — SODIUM CHLORIDE 0.9 % IV SOLN
INTRAVENOUS | Status: AC
  Administered 2014-03-21: 75 mL/h via INTRAVENOUS

## 2014-03-21 MED ORDER — NITROGLYCERIN IN D5W 200-5 MCG/ML-% IV SOLN
0.0000 ug/min | INTRAVENOUS | Status: DC
  Administered 2014-03-21: 20 ug/min via INTRAVENOUS

## 2014-03-21 MED ORDER — NITROGLYCERIN 0.4 MG SL SUBL
SUBLINGUAL_TABLET | SUBLINGUAL | Status: AC
  Filled 2014-03-21: qty 1

## 2014-03-21 MED ORDER — ASPIRIN 81 MG PO CHEW
324.0000 mg | CHEWABLE_TABLET | Freq: Once | ORAL | Status: AC
  Administered 2014-03-21: 324 mg via ORAL

## 2014-03-21 MED ORDER — METOPROLOL TARTRATE 12.5 MG HALF TABLET
12.5000 mg | ORAL_TABLET | Freq: Two times a day (BID) | ORAL | Status: DC
  Administered 2014-03-21 – 2014-03-23 (×5): 12.5 mg via ORAL
  Filled 2014-03-21 (×6): qty 1

## 2014-03-21 MED ORDER — FENTANYL CITRATE 0.05 MG/ML IJ SOLN
INTRAMUSCULAR | Status: AC
  Filled 2014-03-21: qty 2

## 2014-03-21 MED ORDER — LISINOPRIL 10 MG PO TABS
10.0000 mg | ORAL_TABLET | Freq: Every day | ORAL | Status: DC
  Administered 2014-03-22 – 2014-03-23 (×2): 10 mg via ORAL
  Filled 2014-03-21 (×2): qty 1

## 2014-03-21 MED ORDER — ASPIRIN 81 MG PO CHEW
81.0000 mg | CHEWABLE_TABLET | Freq: Every day | ORAL | Status: DC
  Administered 2014-03-22 – 2014-03-23 (×2): 81 mg via ORAL
  Filled 2014-03-21 (×3): qty 1

## 2014-03-21 MED ORDER — NITROGLYCERIN 1 MG/10 ML FOR IR/CATH LAB
INTRA_ARTERIAL | Status: AC
  Filled 2014-03-21: qty 10

## 2014-03-21 MED ORDER — TICAGRELOR 90 MG PO TABS
ORAL_TABLET | ORAL | Status: AC
  Filled 2014-03-21: qty 2

## 2014-03-21 MED ORDER — ONDANSETRON HCL 4 MG/2ML IJ SOLN
4.0000 mg | Freq: Once | INTRAMUSCULAR | Status: AC
  Administered 2014-03-21 (×2): 4 mg via INTRAVENOUS

## 2014-03-21 MED ORDER — ACETAMINOPHEN 325 MG PO TABS
650.0000 mg | ORAL_TABLET | Freq: Four times a day (QID) | ORAL | Status: DC | PRN
  Administered 2014-03-21: 650 mg via ORAL
  Filled 2014-03-21: qty 2

## 2014-03-21 MED ORDER — ATORVASTATIN CALCIUM 80 MG PO TABS
80.0000 mg | ORAL_TABLET | Freq: Every day | ORAL | Status: DC
  Administered 2014-03-21 – 2014-03-22 (×2): 80 mg via ORAL
  Filled 2014-03-21 (×3): qty 1

## 2014-03-21 MED ORDER — HYDRALAZINE HCL 20 MG/ML IJ SOLN
10.0000 mg | Freq: Once | INTRAMUSCULAR | Status: AC
  Administered 2014-03-21: 10 mg via INTRAVENOUS

## 2014-03-21 MED ORDER — MIDAZOLAM HCL 2 MG/2ML IJ SOLN
INTRAMUSCULAR | Status: AC
  Filled 2014-03-21: qty 2

## 2014-03-21 MED ORDER — HEPARIN (PORCINE) IN NACL 2-0.9 UNIT/ML-% IJ SOLN
INTRAMUSCULAR | Status: AC
  Filled 2014-03-21: qty 1000

## 2014-03-21 MED ORDER — NITROGLYCERIN 0.4 MG SL SUBL
0.4000 mg | SUBLINGUAL_TABLET | SUBLINGUAL | Status: DC | PRN
  Administered 2014-03-21 (×2): 0.4 mg via SUBLINGUAL

## 2014-03-21 MED ORDER — LISINOPRIL 5 MG PO TABS
5.0000 mg | ORAL_TABLET | Freq: Every day | ORAL | Status: DC
  Filled 2014-03-21: qty 1

## 2014-03-21 MED ORDER — ASPIRIN 81 MG PO CHEW
CHEWABLE_TABLET | ORAL | Status: AC
  Filled 2014-03-21: qty 4

## 2014-03-21 MED ORDER — NITROGLYCERIN IN D5W 200-5 MCG/ML-% IV SOLN
INTRAVENOUS | Status: AC
  Filled 2014-03-21: qty 250

## 2014-03-21 MED ORDER — ONDANSETRON HCL 4 MG/2ML IJ SOLN: 4.0000 mg | Freq: Four times a day (QID) | INTRAMUSCULAR | Status: DC | PRN

## 2014-03-21 NOTE — CV Procedure (Signed)
Russell Baldwin is a 44 y.o. male    [id number] LOCATION:  FACILITY: [facility]  PHYSICIAN: Quay Burow, M.D. 1970/07/16   DATE OF PROCEDURE:  03/21/2014  DATE OF DISCHARGE:     CARDIAC CATHETERIZATION     History obtained from chart review.[name] is a 44 year old fit and muscular appearing African-American male who suffered an inferior wall microinfarction 05/22/10. He had a 3.5 x 24 mm long Promus drug-eluting stent placed in the proximal portion of his RCA by Dr. Ellouise Newer. He developed chest pain earlier this morning similar to his prior symptoms was brought by EMS. His EKG showed acute inferior STEMI. He was brought emergently to the cath lab intervention.   PROCEDURE DESCRIPTION:   The patient was brought to the second floor [facility] cath lab in the postabsorptive state. He was not premedicated . His right groinwas prepped and shaved in usual sterile fashion. Xylocaine 1% was used for local anesthesia. A 6 French sheath was inserted into the right common femoral artery using standard Seldinger technique. The patient received 5000 units  of heparin  Intravenously in the emergency room..  6 French right and left Judkins catheters and pigtail catheters were used for selective coronary angiography and left ventriculography respectively. Visipaque dye was used for the entirety of the case. Retrograde aortic, left ventricular and pullback pressures were recorded.      HEMODYNAMICS:    AO SYSTOLIC/AO DIASTOLIC: 409/811   LV SYSTOLIC/LV DIASTOLIC: 914/78  ANGIOGRAPHIC RESULTS:   1. Left main; normal  2. LAD; normal 3. Left circumflex; nondominant and normal.  4. Right coronary artery; dominant with an occluded stent in the proximal portion 5. Left ventriculography; RAO left ventriculogram was performed using  25 mL of Visipaque dye at 12 mL/second. The overall LVEF estimated  40 %  With  wall motion abnormalities notable for severe inferior  hypokinesia.  IMPRESSION:Mr. Mentink has occlusion of his previously placed stent responsible for his inferior STEMI. Will proceed with PCI and restenting using Angiomax, Brilenta and drug eluting stent.  Procedure description: The patient received Angiomax bolus with an ACT of 288. Total contrast and Mr. The patient was 105 mL. Using a 6 [place] JR4 guide catheter along with an 01/190 cm long pro-water guidewire a 2 mm x 12 mm balloon PTCA was performed establishing antegrade flow with a door to balloon time of 28 minutes. Following this a 3.5 x 23 mm science drug-eluting stent was deployed at 15 atm and postdilated with a 3.520 noncompliant balloon at 18 atm (3.7 mm) resulting in reduction of the total occlusion to 0% residual TIMI-3 flow. The patient's chest pain resolved at the end of the case. The guidewire and catheter were removed and the sheath was secured. The patient left the lab in stable condition.  Final impression: successful PCI and restenting of an occluded proximal RCA stent in the setting of inferior STEMI with a drug-eluting stent, Angiomax and Brilenta. Patient will be treated with standard post MI meds including antiplatelet therapy, beta blocker, statin therapy and ACE inhibitor. He will be "fast track" most likely discharged in 48-72 hours. I recommend one year of  Brilenta followed by lifelong Plavix.    Lorretta Harp MD, [facility] 03/21/2014 3:49 AM

## 2014-03-21 NOTE — Interval H&P Note (Signed)
History and Physical Interval Note:  03/21/2014 3:08 AM  Russell Baldwin  has presented today for surgery, with the diagnosis of STEMI  The various methods of treatment have been discussed with the patient and family. After consideration of risks, benefits and other options for treatment, the patient has consented to  Procedure(s): LEFT HEART CATH (N/A) as a surgical intervention .  The patient's history has been reviewed, patient examined, no change in status, stable for surgery.  I have reviewed the patient's chart and labs.  Questions were answered to the patient's satisfaction.     Runell GessBERRY,Aliany Fiorenza J

## 2014-03-21 NOTE — ED Notes (Signed)
Dr Norlene Campbelltter given a copy of troponin results .20

## 2014-03-21 NOTE — ED Provider Notes (Signed)
CSN: [id number]     Arrival date & time 03/21/14  0208 History  This chart was scribed for Marisa Severin, MD by Annye Asa, ED Scribe. This patient was seen in room A11C/A11C and the patient's care was started at 2:15 AM.    Chief Complaint  Patient presents with  . Chest Pain   The history is provided by the patient. No language interpreter was used.    HPI Comments: Russell Baldwin is a 44 y.o. male with past medical history of CAD, MI (4 years PTA, stent placed) who presents to the Emergency Department complaining of sudden onset, "sharp" chest pain with nausea and SOB beginning 30 min ago while lying in bed. Patient rates his pain at present as a 10/10.   Patient states he is not on any medications and does not have a PCP or cardiologist at this time. He denies drug use.  Past Medical History  Diagnosis Date  . Coronary artery disease    Past Surgical History  Procedure Laterality Date  . Cardiac catheterization     No family history on file. History  Substance Use Topics  . Smoking status: Never Smoker   . Smokeless tobacco: Not on file  . Alcohol Use: Yes    Review of Systems  Respiratory: Positive for shortness of breath.   Cardiovascular: Positive for chest pain.  Gastrointestinal: Positive for nausea.      Allergies  Review of patient's allergies indicates no known allergies.  Home Medications   Prior to Admission medications   Medication Sig Start Date End Date Taking? Authorizing Provider  traMADol (ULTRAM) 50 MG tablet Take 1 tablet (50 mg total) by mouth every 6 (six) hours as needed for pain. 08/27/12   Jimmie Molly, MD   BP 158/93 mmHg  Pulse 76  Temp(Src) 98 F (36.7 C)  Resp 21  Ht  (1.778 m)  Wt 210 lb (95.255 kg)  BMI 30.13 kg/m2  SpO2 98% Physical Exam  Constitutional: He is oriented to person, place, and time. He appears well-developed and well-nourished. No distress.  HENT:  Head: Normocephalic and atraumatic.  Mouth/Throat:  Oropharynx is clear and moist. No oropharyngeal exudate.  Moist mucous membranes  Eyes: EOM are normal. Pupils are equal, round, and reactive to light.  Neck: Normal range of motion. Neck supple. No JVD present.  Pulmonary/Chest: Effort normal and breath sounds normal. No respiratory distress. He has no wheezes. He has no rales.  Abdominal: Soft. Bowel sounds are normal. He exhibits no mass. There is no tenderness. There is no rebound and no guarding.  Musculoskeletal: Normal range of motion. He exhibits no edema.  Moves all extremities normally.   Lymphadenopathy:    He has no cervical adenopathy.  Neurological: He is alert and oriented to person, place, and time. He displays normal reflexes.  Skin: Skin is warm and dry. No rash noted.  Psychiatric: He has a normal mood and affect. His behavior is normal.  Nursing note and vitals reviewed.   ED Course  Procedures   DIAGNOSTIC STUDIES: Oxygen Saturation is 98% on RA, normal by my interpretation.    COORDINATION OF CARE: 2:28 AM Discussed treatment plan with pt at bedside and pt agreed to plan.   Labs Review Labs Reviewed  APTT - Abnormal; Notable for the following:    aPTT 23 (*)    All other components within normal limits  I-STAT TROPOININ, ED - Abnormal; Notable for the following:    Troponin i, poc 0.20 (*)  All other components within normal limits  CBC  PROTIME-INR  COMPREHENSIVE METABOLIC PANEL    Imaging Review No results found.   EKG Interpretation   Date/Time:  Sunday March 21 2014 02:13:27 EDT Ventricular Rate:  53 PR Interval:  191 QRS Duration: 91 QT Interval:  418 QTC Calculation: 392 R Axis:   85 Text Interpretation:  Sinus rhythm Probable LVH with secondary repol abnrm  Inferior infarct, acute (RCA) Probable RV involvement, suggest recording  right precordial leads STEMI inferior leads with reciprocal changes  Confirmed by Mickael Mcnutt  MD, Lynanne Delgreco ([id number]) on 03/21/2014 2:53:11 AM      CRITICAL  CARE Performed by: Olivia MackieTTER,Persia Lintner M Total critical care time: 30 min Critical care time was exclusive of separately billable procedures and treating other patients. Critical care was necessary to treat or prevent imminent or life-threatening deterioration. Critical care was time spent personally by me on the following activities: development of treatment plan with patient and/or surrogate as well as nursing, discussions with consultants, evaluation of patient's response to treatment, examination of patient, obtaining history from patient or surrogate, ordering and performing treatments and interventions, ordering and review of laboratory studies, ordering and review of radiographic studies, pulse oximetry and re-evaluation of patient's condition.  MDM   Final diagnoses:  None    I personally performed the services described in this documentation, which was scribed in my presence. The recorded information has been reviewed and is accurate.  [age] year old male presents to emergency part with onset of chest pain 30 minutes ago.  EKG shows inferior wall MI with ST elevations and reciprocal changes.  Patient has history of RCA infarct requiring stent 2012.  Patient has not been compliant with his medications.  He is slightly hypertensive at this time.  Code STEMI called.  Patient to be transferred to the Cath Lab     Marisa Severinlga Nathasha Fiorillo, MD 03/21/14 [phone]

## 2014-03-21 NOTE — Progress Notes (Signed)
    Patient Name: Russell Baldwin Date of Encounter: 03/21/2014  Active Problems:   STEMI (ST elevation myocardial infarction)   Length of Stay: 0  SUBJECTIVE  The patient continues to have chest pressure 2/10 and headache.  CURRENT MEDS . aspirin  81 mg Oral Daily  . atorvastatin  80 mg Oral q1800  . lisinopril  5 mg Oral Daily  . metoprolol tartrate  12.5 mg Oral BID  . nitroGLYCERIN      . ticagrelor  90 mg Oral BID    OBJECTIVE  Filed Vitals:   03/21/14 0615 03/21/14 0630 03/21/14 0645 03/21/14 0700  BP: 144/94 144/102 157/97 155/105  Pulse: 88 68 58 60  Temp:      TempSrc:      Resp: 11 13 11 10   Height:      Weight:      SpO2: 96% 99% 98% 98%    Intake/Output Summary (Last 24 hours) at 03/21/14 1029 Last data filed at 03/21/14 0700  Gross per 24 hour  Intake 203.55 ml  Output      0 ml  Net 203.55 ml   Filed Weights   03/21/14 0214 03/21/14 0415  Weight: 210 lb (95.255 kg) 203 lb 4.2 oz (92.2 kg)    PHYSICAL EXAM  General: Pleasant, NAD. Neuro: Alert and oriented X 3. Moves all extremities spontaneously. Psych: Normal affect. HEENT:  Normal  Neck: Supple without bruits or JVD. Lungs:  Resp regular and unlabored, CTA. Heart: RRR no s3, s4, or murmurs. Abdomen: Soft, non-tender, non-distended, BS + x 4.  Extremities: No clubbing, cyanosis or edema. DP/PT/Radials 2+ and equal bilaterally.  Accessory Clinical Findings  CBC  Recent Labs  03/21/14 0508 03/21/14 0742  WBC 5.1 5.1  HGB 13.5 13.8  HCT 41.1 41.9  MCV 80.4 81.2  PLT 180 192   Basic Metabolic Panel  Recent Labs  03/21/14 0221 03/21/14 0508  NA 136 137  K 4.2 4.1  CL 101 103  CO2 24 28  GLUCOSE 129* 113*  BUN 8 8  CREATININE 1.24 1.01  CALCIUM 9.0 8.7   Liver Function Tests  Recent Labs  03/21/14 0221  AST 96*  ALT 49  ALKPHOS 64  BILITOT 0.3  PROT 6.4  ALBUMIN 4.4   Cardiac Enzymes  Recent Labs  03/21/14 0508  TROPONINI 4.54*   TELE: SR  ECG: SR,  resolved inferior STE, negative T waves in leads I, aVL and V6    ASSESSMENT AND PLAN  44 year old male   1. CAD, s/p inferior STEMI this am, s/p successful PCI and restenting of an occluded proximal RCA stent in the setting of inferior STEMI with a drug-eluting stent, Angiomax and Brilenta. Patient will be treated with standard post MI meds including antiplatelet therapy, beta blocker, statin therapy and ACE inhibitor. He will be "fast track" most likely discharged in 48-72 hours. I recommend one year of Brillinta followed by lifelong Plavix.  - we will increase lisinopril to 10 mg po daily - start imdur 30 mg po daily - wean off iv NTG  2. Hypertension  - we will increase lisinopril to 10 mg po daily - start imdur 30 mg po daily - wean off iv NTG  3. Hyperlipidemia  - on atorvastatin 80 mg po daily   Signed, Lars MassonNELSON, Mylo Choi H MD, [facility] 03/21/2014

## 2014-03-21 NOTE — ED Notes (Signed)
Cards at bedside

## 2014-03-21 NOTE — Progress Notes (Signed)
eLink Physician-Brief Progress Note Patient Name: Russell HaywardCharles Baldwin DOB: 1970-12-28 MRN: [id number]   Date of Service  03/21/2014  HPI/Events of Note  [age] M with PMH sign for HTN/HLD presenting with CP and findings of STEMI.  To cath lab now s/p RCA stent.  On angiomax for 4 hours and prn hydralazine.  Patient is alert HD stable in NAD.  eICU Interventions  Plan of care per cardiology service [facility]     Intervention Category Evaluation Type: New Patient Evaluation  DETERDING,ELIZABETH 03/21/2014, 4:30 AM

## 2014-03-21 NOTE — ED Notes (Signed)
The pt is c/o chest pain while lying in the bed just pta.  C/o nausea and sob.  He reports that he had a mi years ago.  On no meds has no doctor

## 2014-03-21 NOTE — H&P (Signed)
Baldwin ID: Russell Baldwin MRN: [id number], DOB/AGE: April 12, 1970   Admit date: 03/21/2014   Primary Physician: Russell Baldwin Primary Cardiologist: None  Pt. Profile:  42M with HTN, HLD, CAD s/p inferior STEMI (Promus DES to proximal RCA in 2012) who presents with inferior STEMI.   Problem List  Past Medical History  Diagnosis Date  . Coronary artery disease     Past Surgical History  Procedure Laterality Date  . Cardiac catheterization       Allergies  Russell Known Allergies  HPI  42M with HTN, HLD, CAD s/p inferior STEMI (Promus DES to proximal RCA in 2012, Russell significant disease of LAD or LCx) who presents with inferior STEMI.   Russell Baldwin reports he was about to go to bed when he developed acute onset central chest pain similar to prior MI. He noted some associated dyspnea and nausea.  Otherwise this evening was unremarkable.   On arrival to the ER he was hypertensive 158/93, P 76, 98% on RA. He arrived to ER 30 minutes after onset of pain. ECG demonstrated NSR with 3mm inferior STE with reciprocal changes in I and aVL and V4-V6. Inferior Q waves noted. He was given  ASA, 4,000u UFH, SL NTG, zofran.  Non-smoker. Rare EtOH. Denies ilicits.  He has not followed with a cardiologist and is not currently on any medications. Did not have issue with plavix in the past. Russell upcoming surgeries. Production designer, theatre/television/film at [employer]. Two children. Not married. Lives with father who has some sort of heart problem but Russell history of MI.    Home Medications  Prior to Admission medications   Medication Sig Start Date End Date Taking? Authorizing Provider  traMADol (ULTRAM) 50 MG tablet Take 1 tablet (50 mg total) by mouth every 6 (six) hours as needed for pain. 08/27/12   Jimmie Molly, MD    Family History  Russell family history on file.  Social History  History   Social History  . Marital Status: Single    Spouse Name: N/A  . Number of Children: N/A  . Years of Education: N/A    Occupational History  . Not on file.   Social History Main Topics  . Smoking status: Never Smoker   . Smokeless tobacco: Not on file  . Alcohol Use: Yes  . Drug Use: Russell  . Sexual Activity: Not on file   Other Topics Concern  . Not on file   Social History Narrative     Review of Systems General:  Russell chills, fever, night sweats or weight changes.  Cardiovascular:  See HPI Dermatological: Russell rash, lesions/masses Respiratory: Russell cough, dyspnea Urologic: Russell hematuria, dysuria Abdominal:   Russell nausea, vomiting, diarrhea, bright red blood per rectum, melena, or hematemesis Neurologic:  Russell visual changes, wkns, changes in mental status. All other systems reviewed and are otherwise negative except as noted above.  Physical Exam  Blood pressure 159/99, pulse 76, temperature 98 F (36.7 C), resp. rate 21, height  (1.778 m), weight 95.255 kg (210 lb), SpO2 95 %.  General: Pleasant, in obvious distress Psych: Normal affect. Neuro: Alert and oriented X 3. Moves all extremities spontaneously. HEENT: Normal  Neck: Supple without bruits or JVD. Lungs:  Resp regular and unlabored, CTA. Heart: RRR Russell s3, s4, or murmurs. Abdomen: Soft, non-tender, non-distended, BS + x 4.  Extremities: Russell clubbing, cyanosis or edema. DP/PT/Radials 2+ and equal bilaterally.  Labs  Troponin [facility] Test)  Recent Labs  03/21/14 0223  TROPIPOC 0.20*   Russell results for input(s): CKTOTAL, CKMB, TROPONINI in the last 72 hours. Lab Results  Component Value Date   WBC 7.9 03/21/2014   HGB 14.6 03/21/2014   HCT 43.8 03/21/2014   MCV 80.5 03/21/2014   PLT 246 03/21/2014   Russell results for input(s): NA, K, CL, CO2, BUN, CREATININE, CALCIUM, PROT, BILITOT, ALKPHOS, ALT, AST, GLUCOSE in the last 168 hours.  Invalid input(s): LABALBU Lab Results  Component Value Date   CHOL 260* 05/22/2010   HDL 46 05/22/2010   LDLCALC * 05/22/2010    194        Total Cholesterol/HDL:CHD Risk Coronary  Heart Disease Risk Table                     Men   Women  1/2 Average Risk   3.4   3.3  Average Risk       5.0   4.4  2 X Average Risk   9.6   7.1  3 X Average Risk  23.4   11.0        Use the calculated Baldwin Ratio above and the CHD Risk Table to determine the Baldwin's CHD Risk.        ATP III CLASSIFICATION (LDL):  <100     mg/dL   Optimal  161-096100-129  mg/dL   Near or Above                    Optimal  130-159  mg/dL   Borderline  045-409160-189  mg/dL   High  >811>190     mg/dL   Very High   TRIG 99 91/47/829505/21/2012   Russell results found for: DDIMER   Radiology/Studies  Russell results found.  ECG  NSR with 3mm inferior STE with reciprocal changes in I and aVL and V4-V6. Inferior Q waves noted.   ASSESSMENT AND PLAN  63M with HTN, HLD, CAD s/p inferior STEMI (Promus DES to proximal RCA in 2012, Russell significant disease of LAD or LCx) who presents with inferior STEMI.  Suspect late IST. Q waves are likely old based on prior ECG although positive POC TnI suggests CP onset may have been > 30 min ago.   Plan for emergent angiography and primary PCI.   Neva SeatSigned, Kayden Amend, MD 03/21/2014, 2:37 AM

## 2014-03-22 LAB — POCT I-STAT, CHEM 8
BUN: 9 mg/dL (ref 6–23)
CHLORIDE: 101 mmol/L (ref 96–112)
Calcium, Ion: 1.2 mmol/L (ref 1.12–1.23)
Creatinine, Ser: 0.9 mg/dL (ref 0.50–1.35)
GLUCOSE: 152 mg/dL — AB (ref 70–99)
HEMATOCRIT: 42 % (ref 39.0–52.0)
Hemoglobin: 14.3 g/dL (ref 13.0–17.0)
POTASSIUM: 3.4 mmol/L — AB (ref 3.5–5.1)
SODIUM: 139 mmol/L (ref 135–145)
TCO2: 23 mmol/L (ref 0–100)

## 2014-03-22 LAB — BASIC METABOLIC PANEL
Anion gap: 6 (ref 5–15)
BUN: 6 mg/dL (ref 6–23)
CO2: 26 mmol/L (ref 19–32)
Calcium: 9.2 mg/dL (ref 8.4–10.5)
Chloride: 106 mmol/L (ref 96–112)
Creatinine, Ser: 1.07 mg/dL (ref 0.50–1.35)
GFR calc non Af Amer: 84 mL/min — ABNORMAL LOW (ref >90)
Glucose, Bld: 101 mg/dL — ABNORMAL HIGH (ref 70–99)
POTASSIUM: 3.9 mmol/L (ref 3.5–5.1)
Sodium: 138 mmol/L (ref 135–145)

## 2014-03-22 LAB — POCT ACTIVATED CLOTTING TIME: ACTIVATED CLOTTING TIME: 288 s

## 2014-03-22 MED FILL — Sodium Chloride IV Soln 0.9%: INTRAVENOUS | Qty: 50 | Status: AC

## 2014-03-22 NOTE — Progress Notes (Signed)
Report attempted to be called to 3W RN. Pt belongings gathered in room. Will transport shortly to new room.

## 2014-03-22 NOTE — Progress Notes (Signed)
Patient Name: Russell HaywardCharles Perry Date of Encounter: 03/22/2014  Principal Problem:   ST elevation myocardial infarction (STEMI) of inferior wall Active Problems:   Essential hypertension   Hyperlipidemia   Length of Stay: 1  SUBJECTIVE  No chest pain. Troponin rose to 12.41. He wants to go home. Has not ambulated with cardiac rehab.  BP better controlled today. Headaches are almost gone.  CURRENT MEDS . aspirin  81 mg Oral Daily  . atorvastatin  80 mg Oral q1800  . isosorbide mononitrate  30 mg Oral Daily  . lisinopril  10 mg Oral Daily  . metoprolol tartrate  12.5 mg Oral BID  . ticagrelor  90 mg Oral BID    OBJECTIVE  Filed Vitals:   03/22/14 0700 03/22/14 0800 03/22/14 0828 03/22/14 0905  BP: 144/62 143/87  103/62  Pulse: 72 73  94  Temp:   98.1 F (36.7 C)   TempSrc:   Oral   Resp:      Height:      Weight:      SpO2: 96% 96%      Intake/Output Summary (Last 24 hours) at 03/22/14 0949 Last data filed at 03/22/14 0400  Gross per 24 hour  Intake  902.1 ml  Output   2800 ml  Net -1897.9 ml   Filed Weights   03/21/14 0214 03/21/14 0415  Weight: 210 lb (95.255 kg) 203 lb 4.2 oz (92.2 kg)    PHYSICAL EXAM  General: Pleasant, NAD. Neuro: Alert and oriented X 3. Moves all extremities spontaneously. Psych: Normal affect. HEENT:  Normal  Neck: Supple without bruits or JVD. Lungs:  Resp regular and unlabored, CTA. Heart: RRR no s3, s4, or murmurs. Abdomen: Soft, non-tender, non-distended, BS + x 4.  Extremities: No clubbing, cyanosis or edema. DP/PT/Radials 2+ and equal bilaterally.  Accessory Clinical Findings  CBC  Recent Labs  03/21/14 0508 03/21/14 0742  WBC 5.1 5.1  HGB 13.5 13.8  HCT 41.1 41.9  MCV 80.4 81.2  PLT 180 192   Basic Metabolic Panel  Recent Labs  03/21/14 0508 03/22/14 0304  NA 137 138  K 4.1 3.9  CL 103 106  CO2 28 26  GLUCOSE 113* 101*  BUN 8 6  CREATININE 1.01 1.07  CALCIUM 8.7 9.2   Liver Function  Tests  Recent Labs  03/21/14 0221  AST 96*  ALT 49  ALKPHOS 64  BILITOT 0.3  PROT 6.4  ALBUMIN 4.4   Cardiac Enzymes  Recent Labs  03/21/14 0508 03/21/14 1255 03/21/14 1618  TROPONINI 4.54* 5.95* 12.41*   TELE: SR  ECG: SR, resolved inferior STE, negative T waves in leads I, aVL and V6  ASSESSMENT AND PLAN  44 year old male   1. CAD, s/p inferior STEMI this am, s/p successful PCI and restenting of an occluded proximal RCA stent in the setting of inferior STEMI with a drug-eluting stent, Angiomax and Brilenta. Patient will be treated with standard post MI meds including antiplatelet therapy, beta blocker, statin therapy and ACE inhibitor. He will be "fast track" most likely discharged in 48-72 hours. I recommend one year of Brillinta followed by lifelong Plavix.  - ambulate with cardiac rehab today - check 2D echo prior to discharge - transfer to telemetry floor (3W), anticipate d/c home tomorrow  2. Hypertension  - continue current medications - BP At goal on lopressor and lisinopril (dose adjusted yesterday)  3. Hyperlipidemia  - on atorvastatin 80 mg po daily  4. Impaired fasting  glucose - FBG's >100, but generally <126 mg/dL (one was 098, post-MI), check A1c with labs tomorrow - not known to be diabetic, however, this may be a new diagnosis  Chrystie Nose, MD, [facility] Attending Cardiologist [facility]   Earlena Werst C 03/22/2014

## 2014-03-22 NOTE — Progress Notes (Signed)
CARDIAC REHAB PHASE I   PRE:  Rate/Rhythm: 70 SR    BP: sitting 141/72    SaO2:   MODE:  Ambulation: 550 ft   POST:  Rate/Rhythm: 95 SR    BP: sitting 134/79     SaO2:   Tolerated very well, feels great. Quick pace walking, sts he doesn't feel any different. Pt exercises most days of the week at the gym, lifting weights and cardio. He eats healthy as well. Discussed MI, stent, Brilinta, diet and walking gl, NTG and CRPII. Voiced understanding and is frustrated by having another MI. Encouraged him to keep close f/u and take cardiac meds, prob for life. [facility] as he did it his first MI. [phone]   Russell MassonReeve, Russell [name] CES, ACSM 03/22/2014 2:22 PM

## 2014-03-22 NOTE — Care Management Note (Addendum)
    Page 1 of 1   03/23/2014     2:47:04 PM CARE MANAGEMENT NOTE 03/23/2014  Patient:  Russell Baldwin,Russell Baldwin   Account Number:  [id number]  Date Initiated:  03/22/2014  Documentation initiated by:  Junius CreamerWELL,DEBBIE  Subjective/Objective Assessment:   adm w mi     Action/Plan:   lives w fam   Anticipated DC Date:     Anticipated DC Plan:  HOME/SELF CARE      DC Planning Services  CM consult  Medication Assistance      Choice offered to / List presented to:             Status of service:   Medicare Important Message given?   (If response is "NO", the following Medicare IM given date fields will be blank) Date Medicare IM given:   Medicare IM given by:   Date Additional Medicare IM given:   Additional Medicare IM given by:    Discharge Disposition:  HOME/SELF CARE  Per UR Regulation:  Reviewed for med. necessity/level of care/duration of stay  If discussed at Long Length of Stay Meetings, dates discussed:    Comments:  03-23-14 1446 30.00 co pay for Brilinta. Tomi BambergerBrenda Graves-Bigelow, RN,BSN [phone]   3/21 1000 debbie dowell rn,bsn gave pt brilinta 30day free and copay assist card. pt has [employer] [place].

## 2014-03-23 ENCOUNTER — Encounter (HOSPITAL_COMMUNITY): Payer: Self-pay | Admitting: Physician Assistant

## 2014-03-23 DIAGNOSIS — I221 Subsequent ST elevation (STEMI) myocardial infarction of inferior wall: Secondary | ICD-10-CM

## 2014-03-23 DIAGNOSIS — I255 Ischemic cardiomyopathy: Secondary | ICD-10-CM | POA: Diagnosis present

## 2014-03-23 DIAGNOSIS — R7303 Prediabetes: Secondary | ICD-10-CM | POA: Diagnosis present

## 2014-03-23 DIAGNOSIS — R7301 Impaired fasting glucose: Secondary | ICD-10-CM | POA: Diagnosis present

## 2014-03-23 DIAGNOSIS — I2510 Atherosclerotic heart disease of native coronary artery without angina pectoris: Secondary | ICD-10-CM | POA: Diagnosis present

## 2014-03-23 LAB — LIPID PANEL
CHOLESTEROL: 233 mg/dL — AB (ref 0–200)
HDL: 53 mg/dL (ref >39)
LDL CALC: 156 mg/dL — AB (ref 0–99)
Total CHOL/HDL Ratio: 4.4 RATIO
Triglycerides: 120 mg/dL (ref <150)
VLDL: 24 mg/dL (ref 0–40)

## 2014-03-23 LAB — BASIC METABOLIC PANEL
Anion gap: 8 (ref 5–15)
BUN: 11 mg/dL (ref 6–23)
CHLORIDE: 104 mmol/L (ref 96–112)
CO2: 25 mmol/L (ref 19–32)
CREATININE: 1.13 mg/dL (ref 0.50–1.35)
Calcium: 9.1 mg/dL (ref 8.4–10.5)
GFR calc non Af Amer: 79 mL/min — ABNORMAL LOW (ref >90)
Glucose, Bld: 87 mg/dL (ref 70–99)
Potassium: 3.8 mmol/L (ref 3.5–5.1)
SODIUM: 137 mmol/L (ref 135–145)

## 2014-03-23 MED ORDER — ASPIRIN 81 MG PO CHEW: 81.0000 mg | CHEWABLE_TABLET | Freq: Every day | ORAL | Status: DC

## 2014-03-23 MED ORDER — TICAGRELOR 90 MG PO TABS: 90.0000 mg | ORAL_TABLET | Freq: Two times a day (BID) | ORAL | Status: DC

## 2014-03-23 MED ORDER — LISINOPRIL 10 MG PO TABS: 10.0000 mg | ORAL_TABLET | Freq: Every day | ORAL | Status: DC

## 2014-03-23 MED ORDER — METOPROLOL TARTRATE 25 MG PO TABS: 12.5000 mg | ORAL_TABLET | Freq: Two times a day (BID) | ORAL | Status: DC

## 2014-03-23 MED ORDER — NITROGLYCERIN 0.4 MG SL SUBL: 0.4000 mg | SUBLINGUAL_TABLET | SUBLINGUAL | Status: DC | PRN

## 2014-03-23 MED ORDER — ATORVASTATIN CALCIUM 80 MG PO TABS: 80.0000 mg | ORAL_TABLET | Freq: Every day | ORAL | Status: DC

## 2014-03-23 MED ORDER — ISOSORBIDE MONONITRATE ER 30 MG PO TB24: 30.0000 mg | ORAL_TABLET | Freq: Every day | ORAL | Status: DC

## 2014-03-23 NOTE — Discharge Summary (Signed)
Discharge Summary   Patient ID: Russell Baldwin MRN: [id number], DOB/AGE: 43-Apr-1972 43 y.o. Admit date: 03/21/2014 D/C date:     03/23/2014  Primary Cardiologist: Dr. Tresa EndoKelly  Principal Problem:   ST elevation myocardial infarction (STEMI) of inferior wall Active Problems:   Essential hypertension   Hyperlipidemia   Ischemic cardiomyopathy   Impaired fasting glucose   Coronary artery disease   Admission Dates: 03/21/14-03/23/14 Discharge Diagnosis: inferior STEMI for late ISR s/p DES to RCA  HPI: Russell Baldwin is a 44 y.o. male with a history of HTN, HLD, CAD s/p inferior STEMI (Promus DES to proximal RCA in 2012, no significant disease of LAD or LCx) who presented to [facility] ED on 03/21/14 with inferior STEMI.   He was intiially seen for inferior STEMI in 2012 by Dr. Tresa EndoKelly. He never followed up with cardiology and stopped taking his medications about 1 year later. He was in his usual state of health until this Saturday when he developed SSCP similar to his previous MI.   Hospital Course  CAD/Inferior STEMI s/p successful PCI/DES and restenting of an occluded proximal RCA stent -- Peak trop 12.41  -- He will likely need lifelong DAPT. Continue ASA/Brilinta, statin, BB  -- Counseled on the importance of taking his medications  Ischemic CM- LV gram with EF 40% -- No s/s CHF -- Continue ACEi and BB  -- 2D ECHO today.    Hypertension  -- Continue lopressor 12.5mg  BID and lisinopril 10mg   Hyperlipidemia : LDL 156 -- Cont atorvastatin 80 mg po daily  Impaired fasting glucose -- FBG's >100, but generally <126 mg/dL (one was [id number], post-MI), not known to be diabetic. HgA1c pending   The patient has had an uncomplicated hospital course and is recovering well. The femoral catheter site is stable. He  has been seen by Dr. Swazil[place] today and deemed ready for discharge home. All follow-up appointments have been scheduled. A written RX for a 30 day free supply of Brilinta was provided for  the patient. 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 He will be out of work until cleared at his follow up appointment. A work excuse note was provided as well. Discharge medications are listed below.   Discharge Vitals: Blood pressure 142/84, pulse 68, temperature 98.7 F (37.1 C), temperature source Oral, resp. rate 18, height 5\' 10"  (1.778 m), weight 211 lb 3.2 oz (95.8 kg), SpO2 98 %.  Labs: Lab Results  Component Value Date   WBC 5.1 03/21/2014   HGB 13.8 03/21/2014   HCT 41.9 03/21/2014   MCV 81.2 03/21/2014   PLT 192 03/21/2014    Recent Labs Lab 03/21/14 0221  03/23/14 0454  NA 136  < > 137  K 4.2  < > 3.8  CL 101  < > 104  CO2 24  < > 25  BUN 8  < > 11  CREATININE 1.24  < > 1.13  CALCIUM 9.0  < > 9.1  PROT 6.4  --   --   BILITOT 0.3  --   --   ALKPHOS 64  --   --   ALT 49  --   --   AST 96*  --   --   GLUCOSE 129*  < > 87  < > = values in this interval not displayed.  Recent Labs  03/21/14 0508 03/21/14 1255 03/21/14 1618  TROPONINI 4.54* 5.95* 12.41*   Lab Results  Component Value Date   CHOL 233* 03/23/2014  HDL 53 03/23/2014   LDLCALC 156* 03/23/2014   TRIG 120 03/23/2014     Diagnostic Studies/Procedures      03/21/14 CARDIAC CATHETERIZATION   HEMODYNAMICS:   AO SYSTOLIC/AO DIASTOLIC: 146/102  LV SYSTOLIC/LV DIASTOLIC: 137/18  ANGIOGRAPHIC RESULTS:   1. Left main; normal  2. LAD; normal 3. Left circumflex; nondominant and normal.  4. Right coronary artery; dominant with an occluded stent in the proximal portion 5. Left ventriculography; RAO left ventriculogram was performed using  25 mL of Visipaque dye at 12 mL/second. The overall LVEF estimated  40 % With wall motion abnormalities notable for severe inferior hypokinesia.  IMPRESSION:Mr. Schranz has occlusion of his previously placed stent responsible for his inferior STEMI. Will proceed with PCI and restenting using Angiomax, Brilenta and drug eluting stent.  Final  impression: successful PCI and restenting of an occluded proximal RCA stent in the setting of inferior STEMI with a drug-eluting stent, Angiomax and Brilenta. Patient will be treated with standard post MI meds including antiplatelet therapy, beta blocker, statin therapy and ACE inhibitor. He will be "fast track" most likely discharged in 48-72 hours. I recommend one year of Brilenta followed by lifelong Plavix   Discharge Medications     Medication List    TAKE these medications        aspirin 81 MG chewable tablet  Chew 1 tablet (81 mg total) by mouth daily.     atorvastatin 80 MG tablet  Commonly known as:  LIPITOR  Take 1 tablet (80 mg total) by mouth daily at 6 PM.     isosorbide mononitrate 30 MG 24 hr tablet  Commonly known as:  IMDUR  Take 1 tablet (30 mg total) by mouth daily.     lisinopril 10 MG tablet  Commonly known as:  PRINIVIL,ZESTRIL  Take 1 tablet (10 mg total) by mouth daily.     metoprolol tartrate 25 MG tablet  Commonly known as:  LOPRESSOR  Take 0.5 tablets (12.5 mg total) by mouth 2 (two) times daily.     nitroGLYCERIN 0.4 MG SL tablet  Commonly known as:  NITROSTAT  Place 1 tablet (0.4 mg total) under the tongue every 5 (five) minutes as needed for chest pain.     ticagrelor 90 MG Tabs tablet  Commonly known as:  BRILINTA  Take 1 tablet (90 mg total) by mouth 2 (two) times daily.                 Disposition   The patient will be discharged in stable condition to home.  Follow-up Information    Follow up with Norma Fredrickson, NP On 04/06/2014.   Specialty:  Nurse Practitioner   Why:  @ 11:30am   Contact information:   [address] [place] [address] [phone]         Duration of Discharge Encounter: Greater than 30 minutes including physician and PA time.  Byrd Hesselbach R PA-C 03/23/2014, 11:54 AM

## 2014-03-23 NOTE — Progress Notes (Signed)
CARDIAC REHAB PHASE I   Pt feeling well, has been "up and around". Gave pt cholesterol numbers for his information. Hgb A1C still pending. Instructed pt to watch sugar drinks and f/u in office for his A1C. Has DM diet sheet. No more questions. [phone]  Elissa LovettReeve, Ladislav Caselli RockfishKristan CES, ACSM 03/23/2014 11:52 AM

## 2014-03-23 NOTE — Progress Notes (Signed)
  Echocardiogram 2D Echocardiogram has been performed.  Leta JunglingCooper, Schyler Butikofer M 03/23/2014, 11:32 AM

## 2014-03-23 NOTE — Progress Notes (Signed)
Patient Name: Russell Baldwin Date of Encounter: 03/23/2014     Principal Problem:   ST elevation myocardial infarction (STEMI) of inferior wall Active Problems:   Essential hypertension   Hyperlipidemia    SUBJECTIVE  No further CP or SOB.   CURRENT MEDS . aspirin  81 mg Oral Daily  . atorvastatin  80 mg Oral q1800  . isosorbide mononitrate  30 mg Oral Daily  . lisinopril  10 mg Oral Daily  . metoprolol tartrate  12.5 mg Oral BID  . ticagrelor  90 mg Oral BID    OBJECTIVE  Filed Vitals:   03/22/14 1226 03/22/14 1350 03/22/14 2031 03/23/14 0500  BP:  141/72 135/81 142/84  Pulse:  70 65 68  Temp: 98.4 F (36.9 C) 97.5 F (36.4 C) 99.1 F (37.3 C) 98.7 F (37.1 C)  TempSrc: Oral Oral Oral Oral  Resp:  Height:      Weight:    211 lb 3.2 oz (95.8 kg)  SpO2:  98% 100% 98%    Intake/Output Summary (Last 24 hours) at 03/23/14 1029 Last data filed at 03/22/14 1800  Gross per 24 hour  Intake    300 ml  Output    400 ml  Net   -100 ml   Filed Weights   03/21/14 0214 03/21/14 0415 03/23/14 0500  Weight: 210 lb (95.255 kg) 203 lb 4.2 oz (92.2 kg) 211 lb 3.2 oz (95.8 kg)    PHYSICAL EXAM  General: Pleasant, NAD. Neuro: Alert and oriented X 3. Moves all extremities spontaneously. Psych: Normal affect. HEENT:  Normal  Neck: Supple without bruits or JVD. Lungs:  Resp regular and unlabored, CTA. Heart: RRR no s3, s4, or murmurs. Abdomen: Soft, non-tender, non-distended, BS + x 4.  Extremities: No clubbing, cyanosis or edema. DP/PT/Radials 2+ and equal bilaterally.  Accessory Clinical Findings  CBC  Recent Labs  03/21/14 0508 03/21/14 0742  WBC 5.1 5.1  HGB 13.5 13.8  HCT 41.1 41.9  MCV 80.4 81.2  PLT 180 192   Basic Metabolic Panel  Recent Labs  03/22/14 0304 03/23/14 0454  NA 138 137  K 3.9 3.8  CL 106 104  CO2 26 25  GLUCOSE 101* 87  BUN 6 11  CREATININE 1.07 1.13  CALCIUM 9.2 9.1   Liver Function Tests  Recent Labs  03/21/14 0221  AST 96*  ALT 49  ALKPHOS 64  BILITOT 0.3  PROT 6.4  ALBUMIN 4.4   No results for input(s): LIPASE, AMYLASE in the last 72 hours. Cardiac Enzymes  Recent Labs  03/21/14 0508 03/21/14 1255 03/21/14 1618  TROPONINI 4.54* 5.95* 12.41*    No results for input(s): HGBA1C in the last 72 hours. Fasting Lipid Panel  Recent Labs  03/23/14 0454  CHOL 233*  HDL 53  LDLCALC 156*  TRIG 120  CHOLHDL 4.4    TELE  NSR with some sinus tachy and also some sinus brady.   Radiology/Studies  No results found.  ASSESSMENT AND PLAN  70M with HTN, HLD, CAD s/p inferior STEMI (Promus DES to proximal RCA in 2012, no significant disease of LAD or LCx) who presented to [facility] ED on 03/21/14 with inferior STEMI.   Inferior STEMI s/p successful PCI/DES and restenting of an occluded proximal RCA stent -- Peak trop 12.41  -- He will likely need lifelong DAPT. Continue ASA/Brilinta, statin, BB  -- Counseled on the importance of taking his medications  Ischemic CM- LV gram with EF 40% --  No s/s CHF -- Continue ACEi and BB  -- 2D ECHO today.   Hypertension  -- Continue lopressor 12.5mg  BID and lisinopril 10mg   Hyperlipidemia : LDL 156 -- cont atorvastatin 80 mg po daily  Impaired fasting glucose -- FBG's >100, but generally <126 mg/dL (one was 161129, post-MI),  not known to be diabetic. HgA1c pending   Russell FischerSigned, THOMPSON, KATHRYN R PA-C  Pager [phone]  Patient seen and examined and history reviewed. Agree with above findings and plan. Patient is doing very well. No chest pain. Ambulating in halls. No arrhythmias. Plan on Echo today. He is ready for DC today. Will continue lisinopril and metoprolol for LV dysfunction. He should be on DAPT for life with history of late stent thrombosis. He is on high dose statin therapy. Stressed the importance of regular cardiac follow up and taking his medication.   Russell Baldwin, MDFACC 03/23/2014 11:14 AM

## 2014-03-23 NOTE — Progress Notes (Signed)
DC instructions given to patient. Educated. VSS. CCMD notified of DC. PIV DC, hemostasis achieved. DC delayed due to pt awaiting ride. Pt escorted by NT via wheelchair to private vehicle driven by father. All belongings sent home with patient. Rx given by Florentina AddisonKatie, PA.

## 2014-03-24 LAB — HEMOGLOBIN A1C
Hgb A1c MFr Bld: 6 % — ABNORMAL HIGH (ref 4.8–5.6)
Mean Plasma Glucose: 126 mg/dL

## 2014-03-31 ENCOUNTER — Encounter: Payer: Self-pay | Admitting: Cardiology

## 2014-04-06 ENCOUNTER — Encounter: Payer: Self-pay | Admitting: Nurse Practitioner

## 2014-04-06 ENCOUNTER — Ambulatory Visit (INDEPENDENT_AMBULATORY_CARE_PROVIDER_SITE_OTHER): Payer: PRIVATE HEALTH INSURANCE | Admitting: Nurse Practitioner

## 2014-04-06 VITALS — BP 140/98 | HR 50 | Resp 16 | Ht 70.0 in | Wt 209.0 lb

## 2014-04-06 DIAGNOSIS — Z955 Presence of coronary angioplasty implant and graft: Secondary | ICD-10-CM | POA: Diagnosis not present

## 2014-04-06 DIAGNOSIS — I213 ST elevation (STEMI) myocardial infarction of unspecified site: Secondary | ICD-10-CM | POA: Diagnosis not present

## 2014-04-06 LAB — BASIC METABOLIC PANEL
BUN: 8 mg/dL (ref 6–23)
CO2: 28 mEq/L (ref 19–32)
Calcium: 9.5 mg/dL (ref 8.4–10.5)
Chloride: 103 mEq/L (ref 96–112)
Creatinine, Ser: 1.2 mg/dL (ref 0.40–1.50)
GFR: 85.11 mL/min (ref >60.00)
Glucose, Bld: 111 mg/dL — ABNORMAL HIGH (ref 70–99)
Potassium: 4.2 mEq/L (ref 3.5–5.1)
Sodium: 135 mEq/L (ref 135–145)

## 2014-04-06 LAB — CBC
HCT: 41.1 % (ref 39.0–52.0)
Hemoglobin: 13.8 g/dL (ref 13.0–17.0)
MCHC: 33.5 g/dL (ref 30.0–36.0)
MCV: 79.1 fl (ref 78.0–100.0)
Platelets: 256 10*3/uL (ref 150.0–400.0)
RBC: 5.19 Mil/uL (ref 4.22–5.81)
RDW: 13.4 % (ref 11.5–15.5)
WBC: 4.8 10*3/uL (ref 4.0–10.5)

## 2014-04-06 LAB — HEPATIC FUNCTION PANEL
ALT: 35 U/L (ref 0–53)
AST: 21 U/L (ref 0–37)
Albumin: 4 g/dL (ref 3.5–5.2)
Alkaline Phosphatase: 62 U/L (ref 39–117)
Bilirubin, Direct: 0.1 mg/dL (ref 0.0–0.3)
Total Bilirubin: 0.4 mg/dL (ref 0.2–1.2)
Total Protein: 6.8 g/dL (ref 6.0–8.3)

## 2014-04-06 MED ORDER — LISINOPRIL 20 MG PO TABS: 20.0000 mg | ORAL_TABLET | Freq: Every day | ORAL | Status: DC

## 2014-04-06 NOTE — Progress Notes (Signed)
CARDIOLOGY OFFICE NOTE  Date:  04/06/2014    Russell Baldwin Date of Birth: 27-Apr-1970 Medical Record #[id number]  PCP:  [name]  Cardiologist:  Allyson Sabal    Chief Complaint  Patient presents with  . Coronary Artery Disease    TOC/post hospital visit - seen for Dr. Allyson Sabal     History of Present Illness: Russell Baldwin is a 44 y.o. male who presents today for a TOC/post hospital visit - however no phone call documented.  Seen for Dr. Allyson Sabal.   He has HTN, HLD, impaired glucose and known CAD with remote inferior STEMI in 2012 with DES to proximal RCA.    He was previsouly seen by Dr. Tresa Endo but never followed up and stopped his medications about one year later. He then presented last month again with inferior STEMI - treated with PCI/DES and restenting of an occluded proximal RCA stent. EF is 40%. Felt to need lifelong DAPT.   Comes in today. Here alone. Doing well. Did have one spell of chest tightness this weekend - he was just sitting - lasted about 20 minutes. Took a NTG - no relief. Just subsided on its own. Did NOT feel like his prior chest pain syndrome. Taking his medicines. Has no way of checking BP at home. Has returned to the gym. Anxious to return to work tomorrow. No smoking or drug history. FH is + for CAD in his parents when they were in their 48's.   Past Medical History  Diagnosis Date  . Coronary artery disease     a. s/p inferior STEMI (Promus DES to proximal RCA in 2012)  b. inferior STEMI for late ISR s/p DES to RCA   . HLD (hyperlipidemia)   . HTN (hypertension)   . Ischemic cardiomyopathy     a. LV gram EF 40% (03/2014); normal EF by echo  . Impaired fasting glucose     Past Surgical History  Procedure Laterality Date  . Cardiac catheterization    . Left heart cath N/A 03/21/2014    Procedure: LEFT HEART CATH;  Surgeon: Runell Gess, MD;  Location: [facility] CATH LAB;  Service: Cardiovascular;  Laterality: N/A;     Medications: Current Outpatient  Prescriptions  Medication Sig Dispense Refill  . aspirin 81 MG chewable tablet Chew 1 tablet (81 mg total) by mouth daily.    Marland Kitchen atorvastatin (LIPITOR) 80 MG tablet Take 1 tablet (80 mg total) by mouth daily at 6 PM. 30 tablet 11  . isosorbide mononitrate (IMDUR) 30 MG 24 hr tablet Take 1 tablet (30 mg total) by mouth daily. 30 tablet 11  . metoprolol tartrate (LOPRESSOR) 25 MG tablet Take 0.5 tablets (12.5 mg total) by mouth 2 (two) times daily. 30 tablet 11  . nitroGLYCERIN (NITROSTAT) 0.4 MG SL tablet Place 1 tablet (0.4 mg total) under the tongue every 5 (five) minutes as needed for chest pain. 25 tablet 12  . ticagrelor (BRILINTA) 90 MG TABS tablet Take 1 tablet (90 mg total) by mouth 2 (two) times daily. 60 tablet 11  . lisinopril (PRINIVIL,ZESTRIL) 20 MG tablet Take 1 tablet (20 mg total) by mouth daily. 90 tablet 3   No current facility-administered medications for this visit.    Allergies: No Known Allergies  Social History: The patient  reports that he has never smoked. He does not have any smokeless tobacco history on file. He reports that he drinks alcohol. He reports that he does not use illicit drugs.  Family History: The patient's family history includes Diabetes in his father and mother; Heart disease in his father and mother.   Review of Systems: Please see the history of present illness.    All other systems are reviewed and negative.   Physical Exam: VS:  BP 140/98 mmHg  Pulse 50  Resp 16  Ht  (1.778 m)  Wt 209 lb (94.802 kg)  BMI 29.99 kg/m2  SpO2 97% .  BMI Body mass index is 29.99 kg/(m^2).  Wt Readings from Last 3 Encounters:  04/06/14 209 lb (94.802 kg)  03/23/14 211 lb 3.2 oz (95.8 kg)    General: Very pleasant and well mannered. Well developed, well nourished and in no acute distress.  HEENT: Normal. Neck: Supple, no JVD, carotid bruits, or masses noted.  Cardiac: Regular rate and rhythm. No murmurs, rubs, or gallops. No edema.  Respiratory:   Lungs are clear to auscultation bilaterally with normal work of breathing.  GI: Soft and nontender.  MS: No deformity or atrophy. Gait and ROM intact. Skin: Warm and dry. Color is normal.  Neuro:  Strength and sensation are intact and no gross focal deficits noted.  Psych: Alert, appropriate and with normal affect.   LABORATORY DATA:  EKG:  EKG is ordered today. This shows NSR - he has evolving changes with inferolateral ST/T wave changes.   Lab Results  Component Value Date   WBC 5.1 03/21/2014   HGB 13.8 03/21/2014   HCT 41.9 03/21/2014   PLT 192 03/21/2014   GLUCOSE 87 03/23/2014   CHOL 233* 03/23/2014   TRIG 120 03/23/2014   HDL 53 03/23/2014   LDLCALC 156* 03/23/2014   ALT 49 03/21/2014   AST 96* 03/21/2014   NA 137 03/23/2014   K 3.8 03/23/2014   CL 104 03/23/2014   CREATININE 1.13 03/23/2014   BUN 11 03/23/2014   CO2 25 03/23/2014   TSH 1.135 05/21/2010   INR 0.98 03/21/2014   HGBA1C 6.0* 03/23/2014    BNP (last 3 results) No results for input(s): BNP in the last 8760 hours.  ProBNP (last 3 results) No results for input(s): PROBNP in the last 8760 hours.   Other Studies Reviewed Today:   ANGIOGRAPHIC RESULTS:   1. Left main; normal  2. LAD; normal 3. Left circumflex; nondominant and normal.  4. Right coronary artery; dominant with an occluded stent in the proximal portion 5. Left ventriculography; RAO left ventriculogram was performed using  25 mL of Visipaque dye at 12 mL/second. The overall LVEF estimated  40 % With wall motion abnormalities notable for severe inferior hypokinesia.  IMPRESSION:Russell Baldwin has occlusion of his previously placed stent responsible for his inferior STEMI. Will proceed with PCI and restenting using Angiomax, Brilenta and drug eluting stent.  Final impression: successful PCI and restenting of an occluded proximal RCA stent in the setting of inferior STEMI with a drug-eluting stent, Angiomax and Brilenta. Patient will  be treated with standard post MI meds including antiplatelet therapy, beta blocker, statin therapy and ACE inhibitor. He will be "fast track" most likely discharged in 48-72 hours. I recommend one year of Brilenta followed by lifelong Plavix   Echo Study Conclusions from 03/2014  - Left ventricle: The cavity size was normal. Wall thickness was increased in a pattern of moderate LVH. Systolic function was normal. The estimated ejection fraction was in the range of 50% to 55%. There is mild hypokinesis of the inferior myocardium. Left ventricular diastolic function parameters were normal.  Assessment/Plan: 1. Recent inferior STEMI due to restenosis - s/p repeat DES to RCA - committed to lifelong DAPT. Overall felt to be doing well. Back to his usual activities and is given the ok to return to work  2. HTN - Lisinopril is increased today  3. Chest pain - no relief with NTG - he will monitor.   4. HLD - recheck his LFTs today - fasting labs on return.   Current medicines are reviewed with the patient today.  The patient does not have concerns regarding medicines other than what has been noted above.  The following changes have been made:  See above.  Labs/ tests ordered today include:    Orders Placed This Encounter  Procedures  . Basic metabolic panel  . CBC  . Hepatic function panel  . EKG 12-Lead     Disposition:   FU with me with fasting labs in 4 to 6 weeks  Patient is agreeable to this plan and will call if any problems develop in the interim.   Signed: Rosalio MacadamiaLori C. Levonia Wolfley, RN, ANP-C 04/06/2014 12:14 PM  [facility] [address] [place], [place]  [id number] Phone: [phone] Fax: [phone]

## 2014-04-06 NOTE — Patient Instructions (Addendum)
We will be checking the following labs today BMET, HPF & CBC  Stay on your current medicines but increase the Lisinopril to 20 mg a day - this has been sent to your drug store - you may take 2 of your 10 mg tablets to make this dose  See me in 4 to 6 weeks - we will do fasting labs at that visit  Ok to return to work  Call the [facility] office at [phone] if you have any questions, problems or concerns.

## 2014-05-12 ENCOUNTER — Telehealth: Payer: Self-pay | Admitting: Cardiovascular Disease

## 2014-05-12 ENCOUNTER — Ambulatory Visit: Payer: PRIVATE HEALTH INSURANCE | Admitting: Nurse Practitioner

## 2014-05-12 NOTE — Telephone Encounter (Signed)
New Message  Pt had to resch appt for today 5/11 w/ [name]. Pt of Dr. Allyson SabalBerry, wanted to speak w/ Rn to see if he can be seen ASAP. Please call back and discuss.

## 2014-05-12 NOTE — Telephone Encounter (Signed)
No answer - goes to full VM box.

## 2014-05-13 NOTE — Telephone Encounter (Signed)
Left message for patient to return call.

## 2015-11-18 ENCOUNTER — Encounter (HOSPITAL_COMMUNITY): Payer: Self-pay | Admitting: Emergency Medicine

## 2015-11-18 ENCOUNTER — Ambulatory Visit (HOSPITAL_COMMUNITY)
Admission: EM | Admit: 2015-11-18 | Discharge: 2015-11-18 | Disposition: A | Discharge status: Home / Self Care | Payer: Self-pay | Attending: Family Medicine | Admitting: Family Medicine

## 2015-11-18 DIAGNOSIS — R21 Rash and other nonspecific skin eruption: Secondary | ICD-10-CM

## 2015-11-18 DIAGNOSIS — H60311 Diffuse otitis externa, right ear: Secondary | ICD-10-CM

## 2015-11-18 DIAGNOSIS — I10 Essential (primary) hypertension: Secondary | ICD-10-CM

## 2015-11-18 DIAGNOSIS — I213 ST elevation (STEMI) myocardial infarction of unspecified site: Secondary | ICD-10-CM

## 2015-11-18 DIAGNOSIS — Z955 Presence of coronary angioplasty implant and graft: Secondary | ICD-10-CM

## 2015-11-18 DIAGNOSIS — H66001 Acute suppurative otitis media without spontaneous rupture of ear drum, right ear: Secondary | ICD-10-CM

## 2015-11-18 LAB — POCT I-STAT, CHEM 8
BUN: 5 mg/dL — AB (ref 6–20)
CALCIUM ION: 1.17 mmol/L (ref 1.15–1.40)
Chloride: 102 mmol/L (ref 101–111)
Creatinine, Ser: 1.2 mg/dL (ref 0.61–1.24)
Glucose, Bld: 117 mg/dL — ABNORMAL HIGH (ref 65–99)
HCT: 45 % (ref 39.0–52.0)
Hemoglobin: 15.3 g/dL (ref 13.0–17.0)
Potassium: 3.6 mmol/L (ref 3.5–5.1)
Sodium: 140 mmol/L (ref 135–145)
TCO2: 28 mmol/L (ref 0–100)

## 2015-11-18 MED ORDER — NEOMYCIN-POLYMYXIN-HC 3.5-10000-1 OT SUSP: 4.0000 [drp] | Freq: Three times a day (TID) | OTIC | 0 refills | Status: AC

## 2015-11-18 MED ORDER — AMOXICILLIN 500 MG PO CAPS: 500.0000 mg | ORAL_CAPSULE | Freq: Two times a day (BID) | ORAL | 0 refills | Status: DC

## 2015-11-18 MED ORDER — LISINOPRIL 20 MG PO TABS: 20.0000 mg | ORAL_TABLET | Freq: Every day | ORAL | 0 refills | Status: DC

## 2015-11-18 MED ORDER — METOPROLOL TARTRATE 25 MG PO TABS: 12.5000 mg | ORAL_TABLET | Freq: Two times a day (BID) | ORAL | 0 refills | Status: DC

## 2015-11-18 MED ORDER — TRIAMCINOLONE ACETONIDE 0.025 % EX OINT: 1.0000 "application " | TOPICAL_OINTMENT | Freq: Two times a day (BID) | CUTANEOUS | 0 refills | Status: DC

## 2015-11-18 NOTE — Discharge Instructions (Signed)
First you have an inner and outer ear infection. Going to treat you with drops and oral antibiotics for 10 days. If you are not improving suggest f/u with ENT.  I am giving you a cream for your rash. Apply a light layer to the area twice a day for 5-7 days.  And most important please f/u with your Cardiologist for blood pressure check and management as this elevation can cause kidney and heart failure.

## 2015-11-18 NOTE — ED Provider Notes (Signed)
CSN: [id number]     Arrival date & time 11/18/15  1809 History   First MD Initiated Contact with Patient 11/18/15 1824     Chief Complaint  Patient presents with  . Otalgia  . Rash   (Consider location/radiation/quality/duration/timing/severity/associated sxs/prior Treatment) HPI  Past Medical History:  Diagnosis Date  . Coronary artery disease    a. s/p inferior STEMI (Promus DES to proximal RCA in 2012)  b. inferior STEMI for late ISR s/p DES to RCA   . HLD (hyperlipidemia)   . HTN (hypertension)   . Impaired fasting glucose   . Ischemic cardiomyopathy    a. LV gram EF 40% (03/2014); normal EF by echo   Past Surgical History:  Procedure Laterality Date  . CARDIAC CATHETERIZATION    . LEFT HEART CATH N/A 03/21/2014   Procedure: LEFT HEART CATH;  Surgeon: Runell GessJonathan J Berry, MD;  [facility];  Service: Cardiovascular;  Laterality: N/A;   Family History  Problem Relation Age of Onset  . Heart disease Mother   . Diabetes Mother   . Diabetes Father   . Heart disease Father    Social History  Substance Use Topics  . Smoking status: Never Smoker  . Smokeless tobacco: Never Used  . Alcohol use Yes    Review of Systems  Allergies  Patient has no known allergies.  Home Medications   Prior to Admission medications   Medication Sig Start Date End Date Taking? Authorizing Provider  amoxicillin (AMOXIL) 500 MG capsule Take 1 capsule (500 mg total) by mouth 2 (two) times daily. 11/18/15   Riki SheerMichelle G Jordin Vicencio, PA-C  aspirin 81 MG chewable tablet Chew 1 tablet (81 mg total) by mouth daily. 03/23/14   Janetta HoraKathryn R Thompson, PA-C  atorvastatin (LIPITOR) 80 MG tablet Take 1 tablet (80 mg total) by mouth daily at 6 PM. 03/23/14   Janetta HoraKathryn R Thompson, PA-C  isosorbide mononitrate (IMDUR) 30 MG 24 hr tablet Take 1 tablet (30 mg total) by mouth daily. 03/23/14   Janetta HoraKathryn R Thompson, PA-C  lisinopril (PRINIVIL,ZESTRIL) 20 MG tablet Take 1 tablet (20 mg total) by mouth daily. 11/18/15    Riki SheerMichelle G Menashe Kafer, PA-C  metoprolol tartrate (LOPRESSOR) 25 MG tablet Take 0.5 tablets (12.5 mg total) by mouth 2 (two) times daily. 11/18/15   Riki SheerMichelle G Percy Winterrowd, PA-C  neomycin-polymyxin-hydrocortisone (CORTISPORIN) 3.5-10000-1 otic suspension Place 4 drops into the right ear 3 (three) times daily. 11/18/15 11/28/15  Riki SheerMichelle G Robertta Halfhill, PA-C  nitroGLYCERIN (NITROSTAT) 0.4 MG SL tablet Place 1 tablet (0.4 mg total) under the tongue every 5 (five) minutes as needed for chest pain. 03/23/14   Janetta HoraKathryn R Thompson, PA-C  ticagrelor (BRILINTA) 90 MG TABS tablet Take 1 tablet (90 mg total) by mouth 2 (two) times daily. 03/23/14   Janetta HoraKathryn R Thompson, PA-C  triamcinolone (KENALOG) 0.025 % ointment Apply 1 application topically 2 (two) times daily. 11/18/15   Riki SheerMichelle G Analucia Hush, PA-C   Meds Ordered and Administered this Visit  Medications - No data to display  BP (S) (!) 176/122 (BP Location: Right Arm)   Pulse 82   Temp 98.5 F (36.9 C) (Oral)   Resp 18   SpO2 97%  No data found.   Physical Exam  Urgent Care Course   Clinical Course     Procedures (including critical care time)  Labs Review Labs Reviewed  POCT I-STAT, CHEM 8 - Abnormal; Notable for the following:       Result Value   BUN 5 (*)  Glucose, Bld 117 (*)    All other components within normal limits    Imaging Review No results found.   Visual Acuity Review  Right Eye Distance:   Left Eye Distance:   Bilateral Distance:    Right Eye Near:   Left Eye Near:    Bilateral Near:         MDM   1. Acute suppurative otitis media of right ear without spontaneous rupture of tympanic membrane, recurrence not specified   2. Acute diffuse otitis externa of right ear   3. Essential hypertension   4. Rash and nonspecific skin eruption   5. ST elevation myocardial infarction (STEMI), unspecified artery (HCC)   6. S/P coronary artery stent placement    1-2. Patient with both otitis externa and media with effusion. Treat with  oral and topical antibiotics. Should f/u with PCP or ENT if not improving. 3. Patient educated on f/u with PCP and/or Cardiology for ongoing uncontrolled HTN. I did refill his anti-hypertensive after a stable ISTAT was performed.  He is asymptomatic.  4. Treat with topical Kenalog x 5 -7 days.  5. Noted and educated re: f/u with cards and risk of HTN.  More than 1 hour spent with patient for the above complaints and education that was given today.     Riki SheerMichelle G Dilia Alemany, PA-C 11/18/15 1954

## 2015-11-18 NOTE — ED Triage Notes (Signed)
C/o intermittent right ear pain onset 1 week  Also reports rash behind right ear onset 3 months ++  Denies fevers, chills  BP today is 176/122.... Has not had any BP meds x5 months  A&O X4... NAD

## 2016-02-03 ENCOUNTER — Emergency Department: Payer: PRIVATE HEALTH INSURANCE

## 2016-02-03 ENCOUNTER — Encounter: Payer: Self-pay | Admitting: Emergency Medicine

## 2016-02-03 ENCOUNTER — Emergency Department
Admission: EM | Admit: 2016-02-03 | Discharge: 2016-02-03 | Disposition: A | Discharge status: Home / Self Care | Payer: PRIVATE HEALTH INSURANCE | Attending: Emergency Medicine | Admitting: Emergency Medicine

## 2016-02-03 DIAGNOSIS — S161XXA Strain of muscle, fascia and tendon at neck level, initial encounter: Secondary | ICD-10-CM

## 2016-02-03 DIAGNOSIS — I213 ST elevation (STEMI) myocardial infarction of unspecified site: Secondary | ICD-10-CM

## 2016-02-03 DIAGNOSIS — Z7982 Long term (current) use of aspirin: Secondary | ICD-10-CM | POA: Insufficient documentation

## 2016-02-03 DIAGNOSIS — Z955 Presence of coronary angioplasty implant and graft: Secondary | ICD-10-CM

## 2016-02-03 DIAGNOSIS — M25562 Pain in left knee: Secondary | ICD-10-CM

## 2016-02-03 DIAGNOSIS — Y9389 Activity, other specified: Secondary | ICD-10-CM | POA: Insufficient documentation

## 2016-02-03 DIAGNOSIS — Y92410 Unspecified street and highway as the place of occurrence of the external cause: Secondary | ICD-10-CM | POA: Insufficient documentation

## 2016-02-03 DIAGNOSIS — Y999 Unspecified external cause status: Secondary | ICD-10-CM | POA: Insufficient documentation

## 2016-02-03 DIAGNOSIS — I2510 Atherosclerotic heart disease of native coronary artery without angina pectoris: Secondary | ICD-10-CM | POA: Insufficient documentation

## 2016-02-03 DIAGNOSIS — Z79899 Other long term (current) drug therapy: Secondary | ICD-10-CM | POA: Insufficient documentation

## 2016-02-03 DIAGNOSIS — M25572 Pain in left ankle and joints of left foot: Secondary | ICD-10-CM

## 2016-02-03 DIAGNOSIS — I10 Essential (primary) hypertension: Secondary | ICD-10-CM

## 2016-02-03 MED ORDER — ISOSORBIDE MONONITRATE ER 30 MG PO TB24: 30.0000 mg | ORAL_TABLET | Freq: Every day | ORAL | 0 refills | Status: DC

## 2016-02-03 MED ORDER — OXYCODONE-ACETAMINOPHEN 5-325 MG PO TABS
1.0000 | ORAL_TABLET | Freq: Once | ORAL | Status: AC
  Administered 2016-02-03: 1 via ORAL
  Filled 2016-02-03: qty 1

## 2016-02-03 MED ORDER — IBUPROFEN 800 MG PO TABS: 800.0000 mg | ORAL_TABLET | Freq: Three times a day (TID) | ORAL | 0 refills | Status: DC | PRN

## 2016-02-03 MED ORDER — IBUPROFEN 800 MG PO TABS
800.0000 mg | ORAL_TABLET | Freq: Once | ORAL | Status: AC
  Administered 2016-02-03: 800 mg via ORAL
  Filled 2016-02-03: qty 1

## 2016-02-03 MED ORDER — DIAZEPAM 2 MG PO TABS: 2.0000 mg | ORAL_TABLET | Freq: Three times a day (TID) | ORAL | 0 refills | Status: DC | PRN

## 2016-02-03 MED ORDER — LISINOPRIL 20 MG PO TABS
20.0000 mg | ORAL_TABLET | Freq: Every day | ORAL | 0 refills | Status: DC
Start: 2016-02-03 — End: 2020-09-20

## 2016-02-03 MED ORDER — ATORVASTATIN CALCIUM 80 MG PO TABS: 80.0000 mg | ORAL_TABLET | Freq: Every day | ORAL | 0 refills | Status: DC

## 2016-02-03 MED ORDER — TICAGRELOR 90 MG PO TABS: 90.0000 mg | ORAL_TABLET | Freq: Two times a day (BID) | ORAL | 0 refills | Status: DC

## 2016-02-03 MED ORDER — DIAZEPAM 2 MG PO TABS
2.0000 mg | ORAL_TABLET | Freq: Once | ORAL | Status: AC
  Administered 2016-02-03: 2 mg via ORAL
  Filled 2016-02-03: qty 1

## 2016-02-03 MED ORDER — METOPROLOL TARTRATE 25 MG PO TABS: 12.5000 mg | ORAL_TABLET | Freq: Two times a day (BID) | ORAL | 0 refills | Status: DC

## 2016-02-03 MED ORDER — OXYCODONE-ACETAMINOPHEN 5-325 MG PO TABS: 1.0000 | ORAL_TABLET | ORAL | 0 refills | Status: DC | PRN
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
## 2016-02-03 NOTE — Discharge Instructions (Signed)
1. You may take medicines as needed for pain and muscle spasms (Motrin/Percocet/Valium #15). 2. Apply moist heat to affected area several times daily. 3. Please restart your blood pressure medicines (Imdur, lisinopril, metoprolol), cholesterol medicine (lipitor), and blood thinner (brilinta). 4. Return to the ER for worsening symptoms, persistent vomiting, difficulty breathing, extremity weakness, numbness/tingling or other concerns.
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
## 2020-09-18 ENCOUNTER — Encounter (HOSPITAL_COMMUNITY): Payer: Self-pay

## 2020-09-18 ENCOUNTER — Observation Stay (HOSPITAL_COMMUNITY)
Admission: EM | Admit: 2020-09-18 | Discharge: 2020-09-20 | Disposition: A | Discharge status: Home / Self Care | Payer: Self-pay | Attending: Family Medicine | Admitting: Family Medicine

## 2020-09-18 ENCOUNTER — Other Ambulatory Visit: Payer: Self-pay

## 2020-09-18 DIAGNOSIS — I2510 Atherosclerotic heart disease of native coronary artery without angina pectoris: Principal | ICD-10-CM | POA: Diagnosis present

## 2020-09-18 DIAGNOSIS — E785 Hyperlipidemia, unspecified: Secondary | ICD-10-CM | POA: Diagnosis present

## 2020-09-18 DIAGNOSIS — R079 Chest pain, unspecified: Secondary | ICD-10-CM | POA: Diagnosis present

## 2020-09-18 DIAGNOSIS — Z79899 Other long term (current) drug therapy: Secondary | ICD-10-CM | POA: Insufficient documentation

## 2020-09-18 DIAGNOSIS — I160 Hypertensive urgency: Secondary | ICD-10-CM

## 2020-09-18 DIAGNOSIS — I10 Essential (primary) hypertension: Secondary | ICD-10-CM | POA: Diagnosis present

## 2020-09-18 DIAGNOSIS — Z7982 Long term (current) use of aspirin: Secondary | ICD-10-CM | POA: Insufficient documentation

## 2020-09-18 DIAGNOSIS — I5022 Chronic systolic (congestive) heart failure: Secondary | ICD-10-CM | POA: Insufficient documentation

## 2020-09-18 DIAGNOSIS — I110 Hypertensive heart disease with heart failure: Secondary | ICD-10-CM | POA: Insufficient documentation

## 2020-09-18 DIAGNOSIS — Z955 Presence of coronary angioplasty implant and graft: Secondary | ICD-10-CM

## 2020-09-18 DIAGNOSIS — I255 Ischemic cardiomyopathy: Secondary | ICD-10-CM | POA: Diagnosis present

## 2020-09-18 DIAGNOSIS — I252 Old myocardial infarction: Secondary | ICD-10-CM | POA: Insufficient documentation

## 2020-09-18 DIAGNOSIS — Z20822 Contact with and (suspected) exposure to covid-19: Secondary | ICD-10-CM | POA: Insufficient documentation

## 2020-09-18 NOTE — ED Triage Notes (Signed)
Pt reports sudden onset of CP that started 45 minutes ago, denies any other cardiac symptoms.

## 2020-09-18 NOTE — ED Provider Notes (Signed)
Emergency Medicine Provider Triage Evaluation Note  Russell Baldwin , a 50 y.o. male  was evaluated in triage.  Pt complains of patient is a 50 year old male presented to the ER today with complaint of chest pain.  He states that it began at approximately 11 PM.  He states that he was watching TV when it started.  He states that it came on and seems to be having sharp pain that is 3/10 in his left chest he states it is nonradiating not associated nausea vomiting diaphoresis or shortness of breath.  No heart palpitations.  He states he is not a smoker.  Denies any recreational drug use  Review of Systems  Positive: Chest pain Negative: Fever  Physical Exam  There were no vitals taken for this visit. Gen:   Awake, no distress   Resp:  Normal effort  MSK:   Moves extremities without difficulty Other:  Lungs CTA B  Medical Decision Making  Medically screening exam initiated at 11:51 PM.  Appropriate orders placed.  Russell Baldwin was informed that the remainder of the evaluation will be completed by another provider, this initial triage assessment does not replace that evaluation, and the importance of remaining in the ED until their evaluation is complete.  Patient is a 50 year old male with history of STEMI with stents in the ER today for chest pain that began 45 minutes prior to this note being written. Denies hemoptysis Will obtain chest pain work-up   Gailen Shelter, [place] 09/18/20 2359    Gilda Crease, MD 09/19/20 0100

## 2020-09-19 ENCOUNTER — Other Ambulatory Visit: Payer: Self-pay

## 2020-09-19 ENCOUNTER — Observation Stay (HOSPITAL_BASED_OUTPATIENT_CLINIC_OR_DEPARTMENT_OTHER): Payer: Self-pay

## 2020-09-19 ENCOUNTER — Emergency Department (HOSPITAL_COMMUNITY): Payer: Self-pay

## 2020-09-19 DIAGNOSIS — E785 Hyperlipidemia, unspecified: Secondary | ICD-10-CM

## 2020-09-19 DIAGNOSIS — I25119 Atherosclerotic heart disease of native coronary artery with unspecified angina pectoris: Secondary | ICD-10-CM

## 2020-09-19 DIAGNOSIS — I10 Essential (primary) hypertension: Secondary | ICD-10-CM

## 2020-09-19 DIAGNOSIS — I160 Hypertensive urgency: Secondary | ICD-10-CM

## 2020-09-19 DIAGNOSIS — I255 Ischemic cardiomyopathy: Secondary | ICD-10-CM

## 2020-09-19 DIAGNOSIS — R079 Chest pain, unspecified: Secondary | ICD-10-CM

## 2020-09-19 LAB — CBC WITH DIFFERENTIAL/PLATELET
Abs Immature Granulocytes: 0 10*3/uL (ref 0.00–0.07)
Basophils Absolute: 0 10*3/uL (ref 0.0–0.1)
Basophils Relative: 0 %
Eosinophils Absolute: 0.1 10*3/uL (ref 0.0–0.5)
Eosinophils Relative: 2 %
HCT: 44.2 % (ref 39.0–52.0)
Hemoglobin: 13.8 g/dL (ref 13.0–17.0)
Immature Granulocytes: 0 %
Lymphocytes Relative: 35 %
Lymphs Abs: 1.2 10*3/uL (ref 0.7–4.0)
MCH: 26.1 pg (ref 26.0–34.0)
MCHC: 31.2 g/dL (ref 30.0–36.0)
MCV: 83.6 fL (ref 80.0–100.0)
Monocytes Absolute: 0.3 10*3/uL (ref 0.1–1.0)
Monocytes Relative: 9 %
Neutro Abs: 1.8 10*3/uL (ref 1.7–7.7)
Neutrophils Relative %: 54 %
Platelets: 229 10*3/uL (ref 150–400)
RBC: 5.29 MIL/uL (ref 4.22–5.81)
RDW: 13.2 % (ref 11.5–15.5)
WBC: 3.4 10*3/uL — ABNORMAL LOW (ref 4.0–10.5)
nRBC: 0 % (ref 0.0–0.2)

## 2020-09-19 LAB — LIPID PANEL
Cholesterol: 278 mg/dL — ABNORMAL HIGH (ref 0–200)
HDL: 65 mg/dL (ref >40)
LDL Cholesterol: 206 mg/dL — ABNORMAL HIGH (ref 0–99)
Total CHOL/HDL Ratio: 4.3 RATIO
Triglycerides: 35 mg/dL (ref <150)
VLDL: 7 mg/dL (ref 0–40)

## 2020-09-19 LAB — ECHOCARDIOGRAM COMPLETE
Area-P 1/2: 4.24 cm2
Calc EF: 37.4 %
Height: 70 in
S' Lateral: 4.5 cm
Single Plane A2C EF: 31.3 %
Single Plane A4C EF: 40.5 %
Weight: 2800 oz

## 2020-09-19 LAB — TROPONIN I (HIGH SENSITIVITY)
Troponin I (High Sensitivity): 31 ng/L — ABNORMAL HIGH (ref <18)
Troponin I (High Sensitivity): 35 ng/L — ABNORMAL HIGH (ref <18)
Troponin I (High Sensitivity): 36 ng/L — ABNORMAL HIGH (ref <18)
Troponin I (High Sensitivity): 37 ng/L — ABNORMAL HIGH (ref <18)

## 2020-09-19 LAB — COMPREHENSIVE METABOLIC PANEL
ALT: 19 U/L (ref 0–44)
AST: 22 U/L (ref 15–41)
Albumin: 4 g/dL (ref 3.5–5.0)
Alkaline Phosphatase: 60 U/L (ref 38–126)
Anion gap: 8 (ref 5–15)
BUN: 11 mg/dL (ref 6–20)
CO2: 23 mmol/L (ref 22–32)
Calcium: 8.8 mg/dL — ABNORMAL LOW (ref 8.9–10.3)
Chloride: 106 mmol/L (ref 98–111)
Creatinine, Ser: 1.14 mg/dL (ref 0.61–1.24)
GFR, Estimated: >60 mL/min (ref >60)
Glucose, Bld: 108 mg/dL — ABNORMAL HIGH (ref 70–99)
Potassium: 4 mmol/L (ref 3.5–5.1)
Sodium: 137 mmol/L (ref 135–145)
Total Bilirubin: 0.7 mg/dL (ref 0.3–1.2)
Total Protein: 6.7 g/dL (ref 6.5–8.1)

## 2020-09-19 LAB — HIV ANTIBODY (ROUTINE TESTING W REFLEX): HIV Screen 4th Generation wRfx: NONREACTIVE

## 2020-09-19 LAB — RESP PANEL BY RT-PCR (FLU A&B, COVID) ARPGX2
Influenza A by PCR: NEGATIVE
Influenza B by PCR: NEGATIVE
SARS Coronavirus 2 by RT PCR: NEGATIVE

## 2020-09-19 MED ORDER — METOPROLOL TARTRATE 25 MG PO TABS: 12.5000 mg | ORAL_TABLET | Freq: Two times a day (BID) | ORAL | Status: DC

## 2020-09-19 MED ORDER — ACETAMINOPHEN 500 MG PO TABS
1000.0000 mg | ORAL_TABLET | Freq: Once | ORAL | Status: AC
  Administered 2020-09-19: 1000 mg via ORAL
  Filled 2020-09-19: qty 2

## 2020-09-19 MED ORDER — LISINOPRIL 20 MG PO TABS
20.0000 mg | ORAL_TABLET | Freq: Every day | ORAL | Status: DC
  Administered 2020-09-19: 20 mg via ORAL
  Filled 2020-09-19: qty 1

## 2020-09-19 MED ORDER — CARVEDILOL 25 MG PO TABS
25.0000 mg | ORAL_TABLET | Freq: Two times a day (BID) | ORAL | Status: DC
  Administered 2020-09-19 – 2020-09-20 (×4): 25 mg via ORAL
  Filled 2020-09-19 (×2): qty 1
  Filled 2020-09-19 (×2): qty 2

## 2020-09-19 MED ORDER — TICAGRELOR 90 MG PO TABS
90.0000 mg | ORAL_TABLET | Freq: Two times a day (BID) | ORAL | Status: DC
  Administered 2020-09-19: 90 mg via ORAL
  Filled 2020-09-19: qty 1

## 2020-09-19 MED ORDER — ENOXAPARIN SODIUM 40 MG/0.4ML IJ SOSY
40.0000 mg | PREFILLED_SYRINGE | INTRAMUSCULAR | Status: DC
  Administered 2020-09-19: 40 mg via SUBCUTANEOUS
  Filled 2020-09-19: qty 0.4

## 2020-09-19 MED ORDER — ACETAMINOPHEN 325 MG PO TABS: 650.0000 mg | ORAL_TABLET | ORAL | Status: DC | PRN

## 2020-09-19 MED ORDER — LISINOPRIL 40 MG PO TABS
40.0000 mg | ORAL_TABLET | Freq: Every day | ORAL | Status: DC
  Administered 2020-09-20: 40 mg via ORAL
  Filled 2020-09-19: qty 1

## 2020-09-19 MED ORDER — ASPIRIN 81 MG PO CHEW
324.0000 mg | CHEWABLE_TABLET | Freq: Once | ORAL | Status: AC
  Administered 2020-09-19: 324 mg via ORAL
  Filled 2020-09-19: qty 4

## 2020-09-19 MED ORDER — ONDANSETRON HCL 4 MG/2ML IJ SOLN: 4.0000 mg | Freq: Four times a day (QID) | INTRAMUSCULAR | Status: DC | PRN

## 2020-09-19 MED ORDER — LABETALOL HCL 5 MG/ML IV SOLN
20.0000 mg | Freq: Once | INTRAVENOUS | Status: AC
  Administered 2020-09-19: 20 mg via INTRAVENOUS
  Filled 2020-09-19: qty 4

## 2020-09-19 MED ORDER — NITROGLYCERIN 2 % TD OINT
1.0000 [in_us] | TOPICAL_OINTMENT | Freq: Once | TRANSDERMAL | Status: AC
  Administered 2020-09-19: 1 [in_us] via TOPICAL
  Filled 2020-09-19: qty 1

## 2020-09-19 MED ORDER — ATORVASTATIN CALCIUM 80 MG PO TABS
80.0000 mg | ORAL_TABLET | Freq: Every day | ORAL | Status: DC
  Administered 2020-09-19 – 2020-09-20 (×2): 80 mg via ORAL
  Filled 2020-09-19: qty 1
  Filled 2020-09-19: qty 2

## 2020-09-19 MED ORDER — AMLODIPINE BESYLATE 5 MG PO TABS
10.0000 mg | ORAL_TABLET | Freq: Every day | ORAL | Status: DC
  Administered 2020-09-19: 10 mg via ORAL
  Filled 2020-09-19: qty 2

## 2020-09-19 MED ORDER — ASPIRIN 81 MG PO CHEW
81.0000 mg | CHEWABLE_TABLET | Freq: Every day | ORAL | Status: DC
  Administered 2020-09-19: 81 mg via ORAL
  Filled 2020-09-19 (×2): qty 1

## 2020-09-19 MED ORDER — LABETALOL HCL 5 MG/ML IV SOLN
10.0000 mg | INTRAVENOUS | Status: DC | PRN
  Administered 2020-09-19 (×3): 20 mg via INTRAVENOUS
  Filled 2020-09-19 (×3): qty 4

## 2020-09-19 MED ORDER — ISOSORBIDE MONONITRATE ER 30 MG PO TB24
30.0000 mg | ORAL_TABLET | Freq: Every day | ORAL | Status: DC
  Administered 2020-09-19 – 2020-09-20 (×2): 30 mg via ORAL
  Filled 2020-09-19 (×2): qty 1

## 2020-09-19 MED ORDER — CLOPIDOGREL BISULFATE 75 MG PO TABS
75.0000 mg | ORAL_TABLET | Freq: Every day | ORAL | Status: DC
  Administered 2020-09-20: 75 mg via ORAL
  Filled 2020-09-19: qty 1

## 2020-09-19 MED ORDER — LISINOPRIL 20 MG PO TABS
20.0000 mg | ORAL_TABLET | Freq: Once | ORAL | Status: AC
  Administered 2020-09-19: 20 mg via ORAL
  Filled 2020-09-19: qty 1

## 2020-09-19 NOTE — Assessment & Plan Note (Signed)
--   With PMH of STEMI x2 -- Continue aspirin, atorvastatin, Imdur

## 2020-09-19 NOTE — ED Notes (Signed)
Pt states to this RN that he experienced an MI several years ago. States that his current chest pain is left-sided, non-radiating.

## 2020-09-19 NOTE — Progress Notes (Signed)
  Echocardiogram 2D Echocardiogram has been performed.  Russell Baldwin 09/19/2020, 5:16 PM

## 2020-09-19 NOTE — Assessment & Plan Note (Signed)
--   By echocardiogram 2016 LVEF 40%. -- Appears compensated.  Not on diuretic as an outpatient.  Update echocardiogram.  Further recommendations per cardiology.

## 2020-09-19 NOTE — Consult Note (Signed)
Cardiology Consultation:   Patient ID: Russell Baldwin MRN: [id number]; DOB: 24-Aug-1970  Admit date: 09/18/2020 Date of Consult: 09/19/2020  PCP:  Aviva Kluver   [facility] Providers Cardiologist:  Reatha Harps, MD       New for Dr. Flora Lipps Last seen 2016   Patient Profile:   Russell Baldwin is a 50 y.o. male with a hx of HTN, HLD, impaired glucose, STEMI in 2012-DES to [facility], again 2016 re-stenting pRCA, and  now admitted with chest pain who is being seen 09/19/2020 for the evaluation of angina at the request of Dr. Irene Limbo.  History of Present Illness:   Russell Baldwin with prior STEMI in 2012 with stent to [facility] and STEMI in 2016 with in stent stenosis with re-stenting to pRCA.  EF 40% on last visit.  Felt to need life long DAPT.   + FH of CAD with both parents when they were in their 33s.  Has not had follow up with cardiology since 2016.  He has not been on any medication for some time.  He thinks he may have completed 1 year of Brilinta but is not sure.    Presented to ER last pm with chest pain began 45 min prior to arrival,  while watching TV, was severe 7/10 on arrival then to 3/10.  Sharp with no radiation, no SOB, no diaphoresis.  He does not use ETOH or tobacco, illicit drugs, but had taken energy drinks, Red Bull and Monster for last several days.  He was fatigued and thought they would help.  He does not always sleep well at night and was fatigued for this reason.  He does snore, but never been told he has apnea.  No recent chest pain or SOB. 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    EKG:  The EKG was personally reviewed and demonstrates:  SR Rt atrial enlargement and borderline intraventricular conduction delay.  No acute changes in EKG from 2016.   Telemetry:  Telemetry was personally reviewed and demonstrates:  SR  Na 137, k+ 4.0, BUN 11, Cr 1.14 AST 22 Hs troponin 31,36,35,37 Tchol 278, HDL 65, LDL 206 Hgb 13.8, WBC 3.4 plts 229  COVID  neg CXR--NAD  BP 162/120 on admit, now 141/114  P 55-60  Echo ordered.  No chest pain now.    Past Medical History:  Diagnosis Date   Coronary artery disease    a. s/p inferior STEMI (Promus DES to proximal RCA in 2012)  b. inferior STEMI for late ISR s/p DES to RCA    HLD (hyperlipidemia)    HTN (hypertension)    Impaired fasting glucose    Ischemic cardiomyopathy    a. LV gram EF 40% (03/2014); normal EF by echo    Past Surgical History:  Procedure Laterality Date   CARDIAC CATHETERIZATION     LEFT HEART CATH N/A 03/21/2014   Procedure: LEFT HEART CATH;  Surgeon: Runell Gess, MD;  Location: [facility] CATH LAB;  Service: Cardiovascular;  Laterality: N/A;     Home Medications:  Prior to Admission medications   Medication Sig Start Date End Date Taking? Authorizing Provider  aspirin 81 MG chewable tablet Chew 1 tablet (81 mg total) by mouth daily. Patient not taking: Reported on 09/19/2020 03/23/14   Janetta Hora, PA-C  atorvastatin (LIPITOR) 80 MG tablet Take 1 tablet (80 mg total) by mouth daily at 6 PM. Patient not taking: Reported on 09/19/2020 02/03/16   Chiquita Loth  J, MD  isosorbide mononitrate (IMDUR) 30 MG 24 hr tablet Take 1 tablet (30 mg total) by mouth daily. Patient not taking: Reported on 09/19/2020 02/03/16   Irean Hong, MD  lisinopril (PRINIVIL,ZESTRIL) 20 MG tablet Take 1 tablet (20 mg total) by mouth daily. Patient not taking: Reported on 09/19/2020 02/03/16   Irean Hong, MD  metoprolol tartrate (LOPRESSOR) 25 MG tablet Take 0.5 tablets (12.5 mg total) by mouth 2 (two) times daily. Patient not taking: Reported on 09/19/2020 02/03/16   Irean Hong, MD  nitroGLYCERIN (NITROSTAT) 0.4 MG SL tablet Place 1 tablet (0.4 mg total) under the tongue every 5 (five) minutes as needed for chest pain. Patient not taking: Reported on 09/19/2020 03/23/14   Janetta Hora, PA-C    Inpatient Medications: Scheduled Meds:  aspirin  81 mg Oral Daily   atorvastatin  80 mg Oral  q1800   enoxaparin (LOVENOX) injection  40 mg Subcutaneous Q24H   isosorbide mononitrate  30 mg Oral Daily   lisinopril  20 mg Oral Daily   ticagrelor  90 mg Oral BID   Continuous Infusions:  PRN Meds: acetaminophen, labetalol, ondansetron (ZOFRAN) IV  Allergies:   No Known Allergies  Social History:   Social History   Socioeconomic History   Marital status: Divorced    Spouse name: Not on file   Number of children: Not on file   Years of education: Not on file   Highest education level: Not on file  Occupational History   Not on file  Tobacco Use   Smoking status: Never   Smokeless tobacco: Never  Substance and Sexual Activity   Alcohol use: Yes   Drug use: No   Sexual activity: Not on file  Other Topics Concern   Not on file  Social History Narrative   Not on file   Social Determinants of Health   Financial Resource Strain: Not on file  Food Insecurity: Not on file  Transportation Needs: Not on file  Physical Activity: Not on file  Stress: Not on file  Social Connections: Not on file  Intimate Partner Violence: Not on file    Family History:    Family History  Problem Relation Age of Onset   Heart disease Mother    Diabetes Mother    Diabetes Father    Heart disease Father      ROS:  Please see the history of present illness.  General:no colds or fevers, no weight changes Skin:no rashes or ulcers HEENT:no blurred vision, no congestion CV:see HPI PUL:see HPI GI:no diarrhea constipation or melena, no indigestion GU:no hematuria, no dysuria MS:no joint pain, no claudication Neuro:no syncope, no lightheadedness Endo:no diabetes, no thyroid disease  All other ROS reviewed and negative.     Physical Exam/Data:   Vitals:   09/19/20 1000 09/19/20 1030 09/19/20 1100 09/19/20 1108  BP: (!) 160/118 (!) 141/114 (!) 144/103   Pulse:      Resp: 12 11 11 13   Temp:      TempSrc:      SpO2:    100%  Weight:      Height:       No intake or output  data in the 24 hours ending 09/19/20 1112 Last 3 Weights 09/19/2020 02/03/2016 04/06/2014  Weight (lbs) 175 lb 217 lb 209 lb  Weight (kg) 79.379 kg 98.431 kg 94.802 kg     Body mass index is 25.11 kg/m.  General:  Well nourished, well developed, in no acute  distress HEENT: normal Neck: no JVD Vascular: No carotid bruits; Distal pulses 2+ bilaterally Cardiac:  normal S1, S2; RRR; no murmur gallup rub or click Lungs:  clear to auscultation bilaterally, no wheezing, rhonchi or rales  Abd: soft, nontender, no hepatomegaly  Ext: no edema Musculoskeletal:  No deformities, BUE and BLE strength normal and equal Skin: warm and dry  Neuro:  CNs 2-12 intact, no focal abnormalities noted Psych:  Normal affect   Relevant CV Studies: Cardiac cath 03/2014   ANGIOGRAPHIC RESULTS:    1. Left main; normal   2. LAD; normal 3. Left circumflex; nondominant and normal.   4. Right coronary artery; dominant with an occluded stent in the proximal portion 5. Left ventriculography; RAO left ventriculogram was performed using   25 mL of Visipaque dye at 12 mL/second. The overall LVEF estimated   40 %  With  wall motion abnormalities notable for severe inferior hypokinesia.   IMPRESSION:Russell Baldwin has occlusion of his previously placed stent responsible for his inferior STEMI. Will proceed with PCI and restenting using Angiomax, Brilenta and drug eluting stent.   Final impression: successful PCI and restenting of an occluded proximal RCA stent in the setting of inferior STEMI with a drug-eluting stent, Angiomax and Brilenta. Patient will be treated with standard post MI meds including antiplatelet therapy, beta blocker, statin therapy and ACE inhibitor. He will be "fast track" most likely discharged in 48-72 hours. I recommend one year of  Brilenta followed by lifelong Plavix     Echo Study Conclusions from 03/2014  - Left ventricle: The cavity size was normal. Wall thickness was   increased in a pattern of  moderate LVH. Systolic function was   normal. The estimated ejection fraction was in the range of 50%   to 55%. There is mild hypokinesis of the inferior myocardium.   Left ventricular diastolic function parameters were normal.     Laboratory Data:  High Sensitivity Troponin:   Recent Labs  Lab 09/19/20 0001 09/19/20 0200 09/19/20 0430 09/19/20 0652  TROPONINIHS 31* 36* 35* 37*     Chemistry Recent Labs  Lab 09/19/20 0001  NA 137  K 4.0  CL 106  CO2 23  GLUCOSE 108*  BUN 11  CREATININE 1.14  CALCIUM 8.8*  GFRNONAA >60  ANIONGAP 8    Recent Labs  Lab 09/19/20 0001  PROT 6.7  ALBUMIN 4.0  AST 22  ALT 19  ALKPHOS 60  BILITOT 0.7   Lipids  Recent Labs  Lab 09/19/20 0449  CHOL 278*  TRIG 35  HDL 65  LDLCALC 206*  CHOLHDL 4.3    Hematology Recent Labs  Lab 09/19/20 0001  WBC 3.4*  RBC 5.29  HGB 13.8  HCT 44.2  MCV 83.6  MCH 26.1  MCHC 31.2  RDW 13.2  PLT 229   Thyroid No results for input(s): TSH, FREET4 in the last 168 hours.  BNPNo results for input(s): BNP, PROBNP in the last 168 hours.  DDimer No results for input(s): DDIMER in the last 168 hours.   Radiology/Studies:  DG Chest 2 View  Result Date: 09/19/2020 CLINICAL DATA:  Chest pain EXAM: CHEST - 2 VIEW COMPARISON:  None. FINDINGS: The heart size and mediastinal contours are within normal limits. Both lungs are clear. The visualized skeletal structures are unremarkable. IMPRESSION: No active cardiopulmonary disease. Electronically Signed   By: Deatra Robinson M.D.   On: 09/19/2020 00:42     Assessment and Plan:   Chest pain in combination  with significantly elevated HTN possible precipitated by high caffeine drinks-hs troponin flat in the 30s.  Does have hx of prior STEMIs and stents.  Has been off ASA and brilinta for prob 6 years at least.  Echo pending.  May need cardiac cath to further clarify CAD.   CAD with stent and re-stent to pRCA, last done 2016.  At that time Brilinta for 1  year then lifelong plavix.  Now back on asa and brilinta. Imdur resumed.   HTN uncontrolled on admit ? Meds at admit, pt rec'd labetalol 20 mg IV every 2 hours.  HLD was supposed to be on statin but LDL is 120- back on lipitor 80 mg.    Risk Assessment/Risk Scores:     HEAR Score (for undifferentiated chest pain):  HEAR Score: 4          For questions or updates, please contact [facility] Please consult [email or web address] for contact info under    Signed, Nada Boozer, NP  09/19/2020 11:12 AM

## 2020-09-19 NOTE — ED Provider Notes (Signed)
[facility] EMERGENCY DEPARTMENT Provider Note   CSN: [id number] Arrival date & time: 09/18/20  2346     History Chief Complaint  Patient presents with   Chest Pain    Russell Baldwin is a 50 y.o. male.  Patient presents to the emergency department for evaluation of chest pain.  Patient reports that pain began approximately an hour ago.  Patient was sitting, watching a ball game when the pain started.  It was mostly a sharp pain in the chest.  He reports that he got up, went outside and went for a walk.  This seemed to help a little, but when he got back the pain started again so he came to the ER for evaluation.  Patient reports that pain was 7 out of 10 at its worst.  By the time he got to the ER it was 3 out of 10 and now has completely resolved.  He reports that he has not seen a doctor in about 10 years.   HPI: A 50 year old patient with a history of hypertension and hypercholesterolemia presents for evaluation of chest pain. Initial onset of pain was more than 6 hours ago. The patient's chest pain is sharp and is not worse with exertion. The patient's chest pain is middle- or left-sided, is not well-localized, is not described as heaviness/pressure/tightness and does not radiate to the arms/jaw/neck. The patient does not complain of nausea and denies diaphoresis. The patient has a family history of coronary artery disease in a first-degree relative with onset less than age 88. The patient has no history of stroke, has no history of peripheral artery disease, has not smoked in the past 90 days, denies any history of treated diabetes and does not have an elevated BMI (>=30).   Past Medical History:  Diagnosis Date   Coronary artery disease    a. s/p inferior STEMI (Promus DES to proximal RCA in 2012)  b. inferior STEMI for late ISR s/p DES to RCA    HLD (hyperlipidemia)    HTN (hypertension)    Impaired fasting glucose    Ischemic cardiomyopathy    a. LV gram EF 40%  (03/2014); normal EF by echo    Patient Active Problem List   Diagnosis Date Noted   Chest pain, rule out acute myocardial infarction 09/19/2020   Ischemic cardiomyopathy    Impaired fasting glucose    Coronary artery disease    ST elevation myocardial infarction (STEMI) of inferior wall (HCC) 03/21/2014   Essential hypertension    Hyperlipidemia     Past Surgical History:  Procedure Laterality Date   CARDIAC CATHETERIZATION     LEFT HEART CATH N/A 03/21/2014   Procedure: LEFT HEART CATH;  Surgeon: Runell Gess, MD;  Location: [facility] CATH LAB;  Service: Cardiovascular;  Laterality: N/A;       Family History  Problem Relation Age of Onset   Heart disease Mother    Diabetes Mother    Diabetes Father    Heart disease Father     Social History   Tobacco Use   Smoking status: Never   Smokeless tobacco: Never  Substance Use Topics   Alcohol use: Yes   Drug use: No    Home Medications Prior to Admission medications   Medication Sig Start Date End Date Taking? Authorizing Provider  aspirin 81 MG chewable tablet Chew 1 tablet (81 mg total) by mouth daily. Patient not taking: Reported on 09/19/2020 03/23/14   Janetta Hora, PA-C  atorvastatin (LIPITOR) 80 MG tablet Take 1 tablet (80 mg total) by mouth daily at 6 PM. Patient not taking: Reported on 09/19/2020 02/03/16   Irean Hong, MD  isosorbide mononitrate (IMDUR) 30 MG 24 hr tablet Take 1 tablet (30 mg total) by mouth daily. Patient not taking: Reported on 09/19/2020 02/03/16   Irean Hong, MD  lisinopril (PRINIVIL,ZESTRIL) 20 MG tablet Take 1 tablet (20 mg total) by mouth daily. Patient not taking: Reported on 09/19/2020 02/03/16   Irean Hong, MD  metoprolol tartrate (LOPRESSOR) 25 MG tablet Take 0.5 tablets (12.5 mg total) by mouth 2 (two) times daily. Patient not taking: Reported on 09/19/2020 02/03/16   Irean Hong, MD  nitroGLYCERIN (NITROSTAT) 0.4 MG SL tablet Place 1 tablet (0.4 mg total) under the tongue every 5  (five) minutes as needed for chest pain. Patient not taking: Reported on 09/19/2020 03/23/14   Janetta Hora, PA-C    Allergies    Patient has no known allergies.  Review of Systems   Review of Systems  Cardiovascular:  Positive for chest pain.  All other systems reviewed and are negative.  Physical Exam Updated Vital Signs BP (!) 132/96   Pulse (!) 55   Temp 97.7 F (36.5 C) (Oral)   Resp 11   Ht 5\' 10"  (1.778 m)   Wt 79.4 kg   SpO2 96%   BMI 25.11 kg/m   Physical Exam Vitals and nursing note reviewed.  Constitutional:      General: He is not in acute distress.    Appearance: Normal appearance. He is well-developed.  HENT:     Head: Normocephalic and atraumatic.     Right Ear: Hearing normal.     Left Ear: Hearing normal.     Nose: Nose normal.  Eyes:     Conjunctiva/sclera: Conjunctivae normal.     Pupils: Pupils are equal, round, and reactive to light.  Cardiovascular:     Rate and Rhythm: Regular rhythm.     Heart sounds: S1 normal and S2 normal. No murmur heard.   No friction rub. No gallop.  Pulmonary:     Effort: Pulmonary effort is normal. No respiratory distress.     Breath sounds: Normal breath sounds.  Chest:     Chest wall: No tenderness.  Abdominal:     General: Bowel sounds are normal.     Palpations: Abdomen is soft.     Tenderness: There is no abdominal tenderness. There is no guarding or rebound. Negative signs include Murphy's sign and McBurney's sign.     Hernia: No hernia is present.  Musculoskeletal:        General: Normal range of motion.     Cervical back: Normal range of motion and neck supple.  Skin:    General: Skin is warm and dry.     Findings: No rash.  Neurological:     Mental Status: He is alert and oriented to person, place, and time.     GCS: GCS eye subscore is 4. GCS verbal subscore is 5. GCS motor subscore is 6.     Cranial Nerves: No cranial nerve deficit.     Sensory: No sensory deficit.     Coordination:  Coordination normal.  Psychiatric:        Speech: Speech normal.        Behavior: Behavior normal.        Thought Content: Thought content normal.    ED Results / Procedures / Treatments  Labs (all labs ordered are listed, but only abnormal results are displayed) Labs Reviewed  CBC WITH DIFFERENTIAL/PLATELET - Abnormal; Notable for the following components:      Result Value   WBC 3.4 (*)    All other components within normal limits  COMPREHENSIVE METABOLIC PANEL - Abnormal; Notable for the following components:   Glucose, Bld 108 (*)    Calcium 8.8 (*)    All other components within normal limits  LIPID PANEL - Abnormal; Notable for the following components:   Cholesterol 278 (*)    LDL Cholesterol 206 (*)    All other components within normal limits  TROPONIN I (HIGH SENSITIVITY) - Abnormal; Notable for the following components:   Troponin I (High Sensitivity) 31 (*)    All other components within normal limits  TROPONIN I (HIGH SENSITIVITY) - Abnormal; Notable for the following components:   Troponin I (High Sensitivity) 36 (*)    All other components within normal limits  TROPONIN I (HIGH SENSITIVITY) - Abnormal; Notable for the following components:   Troponin I (High Sensitivity) 35 (*)    All other components within normal limits  TROPONIN I (HIGH SENSITIVITY) - Abnormal; Notable for the following components:   Troponin I (High Sensitivity) 37 (*)    All other components within normal limits  RESP PANEL BY RT-PCR (FLU A&B, COVID) ARPGX2  HIV ANTIBODY (ROUTINE TESTING W REFLEX)    EKG EKG Interpretation  Date/Time:  Monday September 19 2020 02:04:35 EDT Ventricular Rate:  64 PR Interval:  165 QRS Duration: 112 QT Interval:  480 QTC Calculation: 496 R Axis:   60 Text Interpretation: Sinus rhythm Right atrial enlargement Borderline intraventricular conduction delay Repol abnrm suggests ischemia, anterolateral Minimal ST elevation, anterior leads Confirmed by  Gilda Crease [phone]) on 09/19/2020 3:20:03 AM  Radiology DG Chest 2 View  Result Date: 09/19/2020 CLINICAL DATA:  Chest pain EXAM: CHEST - 2 VIEW COMPARISON:  None. FINDINGS: The heart size and mediastinal contours are within normal limits. Both lungs are clear. The visualized skeletal structures are unremarkable. IMPRESSION: No active cardiopulmonary disease. Electronically Signed   By: Deatra Robinson M.D.   On: 09/19/2020 00:42   ECHOCARDIOGRAM COMPLETE  Result Date: 09/19/2020    ECHOCARDIOGRAM REPORT   Patient Name:   ELWARD NOCERA Date of Exam: 09/19/2020 Medical Rec #:  [id number]      Height:       70.0 in Accession #:    [id number]     Weight:       175.0 lb Date of Birth:  1970-10-26      BSA:          1.972 m Patient Age:    50 years       BP:           124/74 mmHg Patient Gender: M              HR:           50 bpm. Exam Location:  Inpatient Procedure: 2D Echo Indications:    chest pain. Ischemic cardiomyopathy  History:        Patient has prior history of Echocardiogram examinations, most                 recent 03/23/2014. CAD; Risk Factors:Hypertension and                 Dyslipidemia.  Sonographer:    Delcie Roch RDCS Referring Phys: [phone] JARED M GARDNER IMPRESSIONS  1. Diffuse hypokinesis, worse in the inferior, inferolateral walls. Left ventricular ejection fraction, by estimation, is 40 to 45%. The left ventricle has mildly decreased function. The left ventricular internal cavity size was moderately dilated. There is mild left ventricular hypertrophy. Left ventricular diastolic parameters are consistent with Grade II diastolic dysfunction (pseudonormalization). Elevated left atrial pressure.  2. Right ventricular systolic function is normal. The right ventricular size is normal.  3. The mitral valve is normal in structure. Mild mitral valve regurgitation.  4. The aortic valve is tricuspid. Aortic valve regurgitation is not visualized. Mild to moderate aortic valve  sclerosis/calcification is present, without any evidence of aortic stenosis.  5. The inferior vena cava is normal in size with greater than 50% respiratory variability, suggesting right atrial pressure of 3 mmHg. FINDINGS  Left Ventricle: Diffuse hypokinesis, worse in the inferior, inferolateral walls. Left ventricular ejection fraction, by estimation, is 40 to 45%. The left ventricle has mildly decreased function. The left ventricular internal cavity size was moderately dilated. There is mild left ventricular hypertrophy. Left ventricular diastolic parameters are consistent with Grade II diastolic dysfunction (pseudonormalization). Elevated left atrial pressure. Right Ventricle: The right ventricular size is normal. Right vetricular wall thickness was not assessed. Right ventricular systolic function is normal. Left Atrium: Left atrial size was normal in size. Right Atrium: Right atrial size was normal in size. Pericardium: There is no evidence of pericardial effusion. Mitral Valve: The mitral valve is normal in structure. Mild mitral valve regurgitation. Tricuspid Valve: The tricuspid valve is normal in structure. Tricuspid valve regurgitation is trivial. Aortic Valve: The aortic valve is tricuspid. Aortic valve regurgitation is not visualized. Mild to moderate aortic valve sclerosis/calcification is present, without any evidence of aortic stenosis. Pulmonic Valve: The pulmonic valve was normal in structure. Pulmonic valve regurgitation is not visualized. Aorta: The aortic root and ascending aorta are structurally normal, with no evidence of dilitation. Venous: The inferior vena cava is normal in size with greater than 50% respiratory variability, suggesting right atrial pressure of 3 mmHg. IAS/Shunts: No atrial level shunt detected by color flow Doppler.  LEFT VENTRICLE PLAX 2D LVIDd:         5.80 cm      Diastology LVIDs:         4.50 cm      LV e' medial:    4.79 cm/s LV PW:         1.60 cm      LV E/e' medial:   18.5 LV IVS:        1.30 cm      LV e' lateral:   6.64 cm/s LVOT diam:     2.20 cm      LV E/e' lateral: 13.4 LV SV:         69 LV SV Index:   35 LVOT Area:     3.80 cm  LV Volumes (MOD) LV vol d, MOD A2C: 160.0 ml LV vol d, MOD A4C: 164.0 ml LV vol s, MOD A2C: 110.0 ml LV vol s, MOD A4C: 97.6 ml LV SV MOD A2C:     50.0 ml LV SV MOD A4C:     164.0 ml LV SV MOD BP:      63.1 ml RIGHT VENTRICLE         IVC TAPSE (M-mode): 2.3 cm  IVC diam: 1.60 cm LEFT ATRIUM              Index       RIGHT ATRIUM  Index LA diam:        4.40 cm  2.23 cm/m  RA Area:     16.70 cm LA Vol (A2C):   107.0 ml 54.26 ml/m RA Volume:   45.10 ml  22.87 ml/m LA Vol (A4C):   74.6 ml  37.83 ml/m LA Biplane Vol: 88.6 ml  44.93 ml/m  AORTIC VALVE LVOT Vmax:   92.40 cm/s LVOT Vmean:  58.100 cm/s LVOT VTI:    0.182 m  AORTA Ao Root diam: 3.00 cm Ao Asc diam:  3.60 cm MITRAL VALVE MV Area (PHT): 4.24 cm    SHUNTS MV Decel Time: 179 msec    Systemic VTI:  0.18 m MV E velocity: 88.80 cm/s  Systemic Diam: 2.20 cm MV A velocity: 52.60 cm/s MV E/A ratio:  1.69 Dietrich Pates MD Electronically signed by Dietrich Pates MD Signature Date/Time: 09/19/2020/7:52:10 PM    Final     Procedures Procedures   Medications Ordered in ED Medications  acetaminophen (TYLENOL) tablet 650 mg (has no administration in time range)  ondansetron (ZOFRAN) injection 4 mg (has no administration in time range)  enoxaparin (LOVENOX) injection 40 mg (40 mg Subcutaneous Given 09/19/20 0934)  atorvastatin (LIPITOR) tablet 80 mg (80 mg Oral Given 09/19/20 1757)  aspirin chewable tablet 81 mg (81 mg Oral Given 09/19/20 0933)  isosorbide mononitrate (IMDUR) 24 hr tablet 30 mg (30 mg Oral Given 09/19/20 0933)  carvedilol (COREG) tablet 25 mg (25 mg Oral Given 09/19/20 1757)  lisinopril (ZESTRIL) tablet 40 mg (has no administration in time range)  amLODipine (NORVASC) tablet 10 mg (10 mg Oral Given 09/19/20 1233)  clopidogrel (PLAVIX) tablet 75 mg (has no administration in  time range)  aspirin chewable tablet 324 mg (324 mg Oral Given 09/19/20 0123)  labetalol (NORMODYNE) injection 20 mg (20 mg Intravenous Given 09/19/20 0129)  nitroGLYCERIN (NITROGLYN) 2 % ointment 1 inch (1 inch Topical Given 09/19/20 0247)  acetaminophen (TYLENOL) tablet 1,000 mg (1,000 mg Oral Given 09/19/20 0247)  lisinopril (ZESTRIL) tablet 20 mg (20 mg Oral Given 09/19/20 1233)    ED Course  I have reviewed the triage vital signs and the nursing notes.  Pertinent labs & imaging results that were available during my care of the patient were reviewed by me and considered in my medical decision making (see chart for details).    MDM Rules/Calculators/A&P HEAR Score: 4                         Patient presents to the emergency department for evaluation of chest pain.  Patient had onset of pain at rest earlier tonight.  Exertion did not change the pain.  Pain was present for about an hour before it resolved.  He is now currently pain-free.  Reviewing records reveals the patient does have a cardiac history.  He had a STEMI in 2012 and again in 2016.Records indicate had a residual ischemic cardiomyopathy.  It appears that he has been lost to follow-up.    Patient markedly hypertensive at arrival.  Initial troponin mildly elevated.  Unclear if this is hypertensive urgency or related to recurrent CAD.  Will require hospitalization for further management.  Discussed with Dr. Lysle Morales, on-call for cardiology.  As patient is currently pain-free, does not recommend heparinization.  Recommends continuing to cycle enzymes, blood pressure control.  Admit to medicine.  CRITICAL CARE Performed by: Gilda Crease   Total critical care time: 33 minutes  Critical care time was  exclusive of separately billable procedures and treating other patients.  Critical care was necessary to treat or prevent imminent or life-threatening deterioration.  Critical care was time spent personally by me on the  following activities: development of treatment plan with patient and/or surrogate as well as nursing, discussions with consultants, evaluation of patient's response to treatment, examination of patient, obtaining history from patient or surrogate, ordering and performing treatments and interventions, ordering and review of laboratory studies, ordering and review of radiographic studies, pulse oximetry and re-evaluation of patient's condition.   Final Clinical Impression(s) / ED Diagnoses Final diagnoses:  Hypertensive urgency  Chest pain, unspecified type    Rx / DC Orders ED Discharge Orders     None        Gilda Crease, MD 09/20/20 [phone]

## 2020-09-19 NOTE — Hospital Course (Addendum)
50 year old man PMH CAD status post inferior wall STEMI with stent to RCA 2012, STEMI 2016 with stent to RCA, last LVEF 40% 2016 PRESENTED to ED with chest pain.  Admitted for further evaluation.

## 2020-09-19 NOTE — Progress Notes (Signed)
PROGRESS NOTE  Russell Baldwin WUJ:[id number] DOB: Nov 10, 1970 DOA: 09/18/2020 PCP: Pcp, No  Brief History   50 year old man PMH CAD status post inferior wall STEMI with stent to RCA 2012, STEMI 2016 with stent to RCA, last LVEF 40% 2016 PRESENTED to ED with chest pain.  Admitted for further evaluation.  A & P  * Chest pain, rule out acute myocardial infarction -- Currently pain-free, troponins flat, less than 40, EKG nonacute.  Continue aspirin, follow-up cardiology evaluation and recommendations.  Coronary artery disease -- With PMH of STEMI x2 -- Continue aspirin, atorvastatin, Imdur  Ischemic cardiomyopathy -- By echocardiogram 2016 LVEF 40%. -- Appears compensated.  Not on diuretic as an outpatient.  Update echocardiogram.  Further recommendations per cardiology.  Essential hypertension -- Stable.  Continue Lipitor, Imdur, lisinopril   Disposition Plan:  Discussion:   Status is: Observation  The patient remains OBS appropriate and will d/c before 2 midnights.  Dispo: The patient is from: Home              Anticipated d/c is to: Home              Patient currently is not medically stable to d/c.   Difficult to place patient No  No charge note  DVT prophylaxis: enoxaparin (LOVENOX) injection 40 mg Start: 09/19/20 1000   Code Status: Full Code Level of care: Telemetry Cardiac Family Communication: none  Brendia Sacks, MD  [facility] Direct contact: see [email or web address] (further directions at bottom of note if needed) 7PM-7AM contact night coverage as at bottom of note 09/19/2020, 9:22 AM  LOS: 0 days   Significant Hospital Events   9/19 admit CP   Consults:  Cardiology    Procedures:    Significant Diagnostic Tests:     Micro Data:  None    Antimicrobials:  None   Interval History/Subjective  CC: f/u CP  No CP now, no SOB Active, no recent CP except episode prior to presentation No pain left arm, neck, jaw No nausea   Objective   Vitals:   Vitals:   09/19/20 0745 09/19/20 0800  BP: (!) 151/110 (!) 160/119  Pulse:    Resp: 14 14  Temp:    SpO2:      Exam: Physical Exam Vitals reviewed.  Constitutional:      General: He is not in acute distress.    Appearance: Normal appearance. He is not ill-appearing or toxic-appearing.  Cardiovascular:     Rate and Rhythm: Normal rate and regular rhythm.     Heart sounds: No murmur heard.   No friction rub. No gallop.     Comments: Telemetry SB, radial pulses 2+ bilaterally Pulmonary:     Effort: Pulmonary effort is normal. No respiratory distress.     Breath sounds: Normal breath sounds. No wheezing, rhonchi or rales.  Musculoskeletal:     Right lower leg: No edema.     Left lower leg: No edema.  Neurological:     General: No focal deficit present.     Mental Status: He is alert.  Psychiatric:        Mood and Affect: Mood normal.        Behavior: Behavior normal.    I have personally reviewed the labs and other data, making special note of:   Today's Data  Troponins flat <40 EKG nonacute   Scheduled Meds:  aspirin  81 mg Oral Daily   atorvastatin  80 mg Oral q1800   enoxaparin (LOVENOX) injection  40 mg Subcutaneous Q24H   isosorbide mononitrate  30 mg Oral Daily   lisinopril  20 mg Oral Daily   ticagrelor  90 mg Oral BID   Continuous Infusions:  Principal Problem:   Chest pain, rule out acute myocardial infarction Active Problems:   Coronary artery disease   Ischemic cardiomyopathy   Essential hypertension   Hyperlipidemia   LOS: 0 days   How to contact the [facility] Attending or Consulting provider 7A - 7P or covering provider during after hours 7P -7A, for this patient?  Check the care team in [facility] and look for a) attending/consulting [facility] provider listed and b) the [facility] team listed Log into [email or web address] and use [facility]'s universal password to access. If you do not have the password, please contact the hospital operator. Locate the [facility] provider you are  looking for under [facility] and page to a number that you can be directly reached. If you still have difficulty reaching the provider, please page the [facility] (Director on Call) for the Hospitalists listed on amion for assistance.

## 2020-09-19 NOTE — Assessment & Plan Note (Signed)
--   Currently pain-free, troponins flat, less than 40, EKG nonacute.  Continue aspirin, follow-up cardiology evaluation and recommendations.

## 2020-09-19 NOTE — Assessment & Plan Note (Signed)
--   Stable.  Continue Lipitor, Imdur, lisinopril

## 2020-09-19 NOTE — H&P (Addendum)
History and Physical    Russell Baldwin VOJ:[id number] DOB: 1970-08-26 DOA: 09/18/2020  PCP: Pcp, No  Patient coming from: Home  I have personally briefly reviewed patient's old medical records in [facility]  Chief Complaint: CP  HPI: Russell Baldwin is a 50 y.o. male with medical history significant of h/o CAD s/p inferior wall STEMI, DES to RCA in 2012.  Late ISR caused repeat STEMI in 2016, DES to RCA.  HTN, HLD, ICM with EF 40% in 2016.  Unfortunately doesn't follow regularly with a doctor despite the STEMIs.  Hasnt seen doctor since 2016 STEMI.  Pt presents to ED with c/o CP.  L sided, non-radiating.  Severe.  Onset at around 11pm this evening while watching TV.  7/10 initially, 3/10 on arrival to ED.  No N/V, SOB.   ED Course: BP running high (183/126) initially, improved to 152/115 after 20mg  labetalol and NTG paste.  Trops 31 and 36.  Pt CP free at the time of my evaluation, no complaints.   Review of Systems: As per HPI, otherwise all review of systems negative.  Past Medical History:  Diagnosis Date   Coronary artery disease    a. s/p inferior STEMI (Promus DES to proximal RCA in 2012)  b. inferior STEMI for late ISR s/p DES to RCA    HLD (hyperlipidemia)    HTN (hypertension)    Impaired fasting glucose    Ischemic cardiomyopathy    a. LV gram EF 40% (03/2014); normal EF by echo    Past Surgical History:  Procedure Laterality Date   CARDIAC CATHETERIZATION     LEFT HEART CATH N/A 03/21/2014   Procedure: LEFT HEART CATH;  Surgeon: 03/23/2014, MD;  Location: [facility] CATH LAB;  Service: Cardiovascular;  Laterality: N/A;     reports that he has never smoked. He has never used smokeless tobacco. He reports current alcohol use. He reports that he does not use drugs.  No Known Allergies  Family History  Problem Relation Age of Onset   Heart disease Mother    Diabetes Mother    Diabetes Father    Heart disease Father      Prior to Admission  medications   Medication Sig Start Date End Date Taking? Authorizing Provider  aspirin 81 MG chewable tablet Chew 1 tablet (81 mg total) by mouth daily. Patient not taking: Reported on 09/19/2020 03/23/14   03/25/14, PA-C  atorvastatin (LIPITOR) 80 MG tablet Take 1 tablet (80 mg total) by mouth daily at 6 PM. Patient not taking: Reported on 09/19/2020 02/03/16   04/02/16, MD  isosorbide mononitrate (IMDUR) 30 MG 24 hr tablet Take 1 tablet (30 mg total) by mouth daily. Patient not taking: Reported on 09/19/2020 02/03/16   04/02/16, MD  lisinopril (PRINIVIL,ZESTRIL) 20 MG tablet Take 1 tablet (20 mg total) by mouth daily. Patient not taking: Reported on 09/19/2020 02/03/16   04/02/16, MD  metoprolol tartrate (LOPRESSOR) 25 MG tablet Take 0.5 tablets (12.5 mg total) by mouth 2 (two) times daily. Patient not taking: Reported on 09/19/2020 02/03/16   04/02/16, MD  nitroGLYCERIN (NITROSTAT) 0.4 MG SL tablet Place 1 tablet (0.4 mg total) under the tongue every 5 (five) minutes as needed for chest pain. Patient not taking: Reported on 09/19/2020 03/23/14   03/25/14, PA-C    Physical Exam: Vitals:   09/19/20 0148 09/19/20 0215 09/19/20 0330 09/19/20 0415  BP: (!) 156/111 (!) 156/124 09/21/20)  162/120 (!) 152/115  Pulse: (!) 59 (!) 58 (!) 55 60  Resp: 19 18 14 11   Temp:      TempSrc:      SpO2: 100% 100% 99% 98%    Constitutional: NAD, calm, comfortable Eyes: PERRL, lids and conjunctivae normal ENMT: Mucous membranes are moist. Posterior pharynx clear of any exudate or lesions.Normal dentition.  Neck: normal, supple, no masses, no thyromegaly Respiratory: clear to auscultation bilaterally, no wheezing, no crackles. Normal respiratory effort. No accessory muscle use.  Cardiovascular: Regular rate and rhythm, no murmurs / rubs / gallops. No extremity edema. 2+ pedal pulses. No carotid bruits.  Abdomen: no tenderness, no masses palpated. No hepatosplenomegaly. Bowel sounds  positive.  Musculoskeletal: no clubbing / cyanosis. No joint deformity upper and lower extremities. Good ROM, no contractures. Normal muscle tone.  Skin: no rashes, lesions, ulcers. No induration Neurologic: CN 2-12 grossly intact. Sensation intact, DTR normal. Strength 5/5 in all 4.  Psychiatric: Normal judgment and insight. Alert and oriented x 3. Normal mood.    Labs on Admission: I have personally reviewed following labs and imaging studies  CBC: Recent Labs  Lab 09/19/20 0001  WBC 3.4*  NEUTROABS 1.8  HGB 13.8  HCT 44.2  MCV 83.6  PLT 229   Basic Metabolic Panel: Recent Labs  Lab 09/19/20 0001  NA 137  K 4.0  CL 106  CO2 23  GLUCOSE 108*  BUN 11  CREATININE 1.14  CALCIUM 8.8*   GFR: CrCl cannot be calculated (Unknown ideal weight.). Liver Function Tests: Recent Labs  Lab 09/19/20 0001  AST 22  ALT 19  ALKPHOS 60  BILITOT 0.7  PROT 6.7  ALBUMIN 4.0   No results for input(s): LIPASE, AMYLASE in the last 168 hours. No results for input(s): AMMONIA in the last 168 hours. Coagulation Profile: No results for input(s): INR, PROTIME in the last 168 hours. Cardiac Enzymes: No results for input(s): CKTOTAL, CKMB, CKMBINDEX, TROPONINI in the last 168 hours. BNP (last 3 results) No results for input(s): PROBNP in the last 8760 hours. HbA1C: No results for input(s): HGBA1C in the last 72 hours. CBG: No results for input(s): GLUCAP in the last 168 hours. Lipid Profile: No results for input(s): CHOL, HDL, LDLCALC, TRIG, CHOLHDL, LDLDIRECT in the last 72 hours. Thyroid Function Tests: No results for input(s): TSH, T4TOTAL, FREET4, T3FREE, THYROIDAB in the last 72 hours. Anemia Panel: No results for input(s): VITAMINB12, FOLATE, FERRITIN, TIBC, IRON, RETICCTPCT in the last 72 hours. Urine analysis:    Component Value Date/Time   COLORURINE YELLOW 05/23/2010 1009   APPEARANCEUR CLEAR 05/23/2010 1009   LABSPEC >=1.030 08/27/2012 1820   PHURINE 6.0 08/27/2012  1820   GLUCOSEU NEGATIVE 08/27/2012 1820   HGBUR NEGATIVE 08/27/2012 1820   BILIRUBINUR NEGATIVE 08/27/2012 1820   KETONESUR NEGATIVE 08/27/2012 1820   PROTEINUR 30 (A) 08/27/2012 1820   UROBILINOGEN 0.2 08/27/2012 1820   NITRITE NEGATIVE 08/27/2012 1820   LEUKOCYTESUR NEGATIVE 08/27/2012 1820    Radiological Exams on Admission: DG Chest 2 View  Result Date: 09/19/2020 CLINICAL DATA:  Chest pain EXAM: CHEST - 2 VIEW COMPARISON:  None. FINDINGS: The heart size and mediastinal contours are within normal limits. Both lungs are clear. The visualized skeletal structures are unremarkable. IMPRESSION: No active cardiopulmonary disease. Electronically Signed   By: 09/21/2020 M.D.   On: 09/19/2020 00:42    EKG: Independently reviewed.  TWI in inferior and lateral leads, similar to prior  Assessment/Plan Principal Problem:   Chest  pain, rule out acute myocardial infarction Active Problems:   Essential hypertension   Hyperlipidemia   Ischemic cardiomyopathy   Coronary artery disease    CP r/o - ACS vs hypertensive urgency CP obs pathway Serial trops Tele monitor NPO Cards called by EDP and I put in message to P. Trent for AM consult Per card on call: no need for heparin gtt at this time Trend serial trops HTN - Labetalol PRN ordered as needed Resume lisinopril (hasnt been on any home meds in an extended period) Resume imdur Likely resume metoprolol when labetalol PRN no longer needed. H/o CAD, h/o ICM with EF 40% as of 2016 echo - Resume ASA Resume statin Check FLP this AM since not on statin for quite a while (though needs to be clearly with h/o 2 STEMIs) Will resume Brilinta with the h/o ISR in past (needs to be on lifelong DAPT according to 2016 April cards office note). Getting 2d echo (presumably pt is overdue for this, last echo was 8 years ago).  DVT prophylaxis: Lovenox Code Status: Full Family Communication: No family in room Disposition Plan: Home after cleared  by cards Consults called: EDP spoke with cards on call, I also put in a message to P. Trent for cards eval this AM. Admission status: Place in obs   Chloeann Alfred M. DO [facility]  How to contact the [facility] Attending or Consulting provider 7A - 7P or covering provider during after hours 7P -7A, for this patient?  Check the care team in [facility] and look for a) attending/consulting [facility] provider listed and b) the [facility] team listed Log into [email or web address]  Amion Physician Scheduling and messaging for groups and whole hospitals  On call and physician scheduling software for group practices, residents, hospitalists and other medical providers for call, clinic, rotation and shift schedules. On[employer] is a hospital-wide system for scheduling doctors and paging doctors on call. EasyPlot is for scientific plotting and data analysis.  [email or web address]  and use [facility]'s universal password to access. If you do not have the password, please contact the hospital operator.  Locate the [facility] provider you are looking for under [facility] and page to a number that you can be directly reached. If you still have difficulty reaching the provider, please page the [facility] (Director on Call) for the Hospitalists listed on amion for assistance.  09/19/2020, 4:41 AM

## 2020-09-20 ENCOUNTER — Other Ambulatory Visit (HOSPITAL_COMMUNITY): Payer: Self-pay

## 2020-09-20 ENCOUNTER — Encounter (HOSPITAL_COMMUNITY): Payer: Self-pay | Admitting: Internal Medicine

## 2020-09-20 ENCOUNTER — Encounter (HOSPITAL_COMMUNITY)
Admission: EM | Disposition: A | Discharge status: Home / Self Care | Payer: Self-pay | Source: Home / Self Care | Attending: Emergency Medicine

## 2020-09-20 DIAGNOSIS — Z955 Presence of coronary angioplasty implant and graft: Secondary | ICD-10-CM

## 2020-09-20 DIAGNOSIS — I5022 Chronic systolic (congestive) heart failure: Secondary | ICD-10-CM

## 2020-09-20 DIAGNOSIS — I160 Hypertensive urgency: Secondary | ICD-10-CM

## 2020-09-20 DIAGNOSIS — I25110 Atherosclerotic heart disease of native coronary artery with unstable angina pectoris: Secondary | ICD-10-CM

## 2020-09-20 HISTORY — PX: LEFT HEART CATH AND CORONARY ANGIOGRAPHY: CATH118249

## 2020-09-20 LAB — BASIC METABOLIC PANEL
Anion gap: 8 (ref 5–15)
BUN: 14 mg/dL (ref 6–20)
CO2: 23 mmol/L (ref 22–32)
Calcium: 8.6 mg/dL — ABNORMAL LOW (ref 8.9–10.3)
Chloride: 104 mmol/L (ref 98–111)
Creatinine, Ser: 1.06 mg/dL (ref 0.61–1.24)
GFR, Estimated: >60 mL/min (ref >60)
Glucose, Bld: 96 mg/dL (ref 70–99)
Potassium: 3.8 mmol/L (ref 3.5–5.1)
Sodium: 135 mmol/L (ref 135–145)

## 2020-09-20 LAB — CBC
HCT: 41.1 % (ref 39.0–52.0)
Hemoglobin: 13.4 g/dL (ref 13.0–17.0)
MCH: 27.1 pg (ref 26.0–34.0)
MCHC: 32.6 g/dL (ref 30.0–36.0)
MCV: 83 fL (ref 80.0–100.0)
Platelets: 186 10*3/uL (ref 150–400)
RBC: 4.95 MIL/uL (ref 4.22–5.81)
RDW: 13.2 % (ref 11.5–15.5)
WBC: 3.1 10*3/uL — ABNORMAL LOW (ref 4.0–10.5)
nRBC: 0 % (ref 0.0–0.2)

## 2020-09-20 SURGERY — LEFT HEART CATH AND CORONARY ANGIOGRAPHY
Anesthesia: LOCAL

## 2020-09-20 MED ORDER — FENTANYL CITRATE (PF) 100 MCG/2ML IJ SOLN
INTRAMUSCULAR | Status: DC | PRN
  Administered 2020-09-20: 50 ug via INTRAVENOUS

## 2020-09-20 MED ORDER — VERAPAMIL HCL 2.5 MG/ML IV SOLN
INTRAVENOUS | Status: AC
  Filled 2020-09-20: qty 2

## 2020-09-20 MED ORDER — FENTANYL CITRATE (PF) 100 MCG/2ML IJ SOLN
INTRAMUSCULAR | Status: AC
  Filled 2020-09-20: qty 2

## 2020-09-20 MED ORDER — LISINOPRIL 40 MG PO TABS
40.0000 mg | ORAL_TABLET | Freq: Every day | ORAL | 1 refills | Status: DC
  Filled 2020-09-20: qty 30, 30d supply, fill #0

## 2020-09-20 MED ORDER — ATORVASTATIN CALCIUM 80 MG PO TABS
80.0000 mg | ORAL_TABLET | Freq: Every day | ORAL | 1 refills | Status: DC
Start: 2020-09-20 — End: 2020-09-20

## 2020-09-20 MED ORDER — SODIUM CHLORIDE 0.9 % IV SOLN: 250.0000 mL | INTRAVENOUS | Status: DC | PRN

## 2020-09-20 MED ORDER — HEPARIN (PORCINE) IN NACL 1000-0.9 UT/500ML-% IV SOLN
INTRAVENOUS | Status: DC | PRN
  Administered 2020-09-20 (×2): 500 mL

## 2020-09-20 MED ORDER — LIDOCAINE HCL (PF) 1 % IJ SOLN
INTRAMUSCULAR | Status: AC
  Filled 2020-09-20: qty 30

## 2020-09-20 MED ORDER — HEPARIN SODIUM (PORCINE) 1000 UNIT/ML IJ SOLN
INTRAMUSCULAR | Status: DC | PRN
  Administered 2020-09-20: 4000 [IU] via INTRAVENOUS

## 2020-09-20 MED ORDER — SPIRONOLACTONE 25 MG PO TABS
25.0000 mg | ORAL_TABLET | Freq: Every day | ORAL | 1 refills | Status: DC
Start: 2020-09-21 — End: 2020-09-20

## 2020-09-20 MED ORDER — ISOSORBIDE MONONITRATE ER 30 MG PO TB24: 30.0000 mg | ORAL_TABLET | Freq: Every day | ORAL | 1 refills | Status: DC

## 2020-09-20 MED ORDER — DAPAGLIFLOZIN PROPANEDIOL 10 MG PO TABS
10.0000 mg | ORAL_TABLET | Freq: Every day | ORAL | 1 refills | Status: DC
  Filled 2020-09-20: qty 30, 30d supply, fill #0

## 2020-09-20 MED ORDER — LIDOCAINE HCL (PF) 1 % IJ SOLN
INTRAMUSCULAR | Status: DC | PRN
  Administered 2020-09-20: 2 mL

## 2020-09-20 MED ORDER — CLOPIDOGREL BISULFATE 75 MG PO TABS: 75.0000 mg | ORAL_TABLET | Freq: Every day | ORAL | 1 refills | Status: DC

## 2020-09-20 MED ORDER — MIDAZOLAM HCL 2 MG/2ML IJ SOLN
INTRAMUSCULAR | Status: DC | PRN
  Administered 2020-09-20: 2 mg via INTRAVENOUS

## 2020-09-20 MED ORDER — CARVEDILOL 25 MG PO TABS
25.0000 mg | ORAL_TABLET | Freq: Two times a day (BID) | ORAL | 1 refills | Status: DC
  Filled 2020-09-20: qty 60, 30d supply, fill #0

## 2020-09-20 MED ORDER — SODIUM CHLORIDE 0.9 % IV SOLN: INTRAVENOUS | Status: AC

## 2020-09-20 MED ORDER — ISOSORBIDE MONONITRATE ER 30 MG PO TB24
30.0000 mg | ORAL_TABLET | Freq: Every day | ORAL | 1 refills | Status: DC
  Filled 2020-09-20: qty 30, 30d supply, fill #0

## 2020-09-20 MED ORDER — LISINOPRIL 40 MG PO TABS
40.0000 mg | ORAL_TABLET | Freq: Every day | ORAL | 1 refills | Status: DC
Start: 2020-09-21 — End: 2020-09-20

## 2020-09-20 MED ORDER — DAPAGLIFLOZIN PROPANEDIOL 10 MG PO TABS: 10.0000 mg | ORAL_TABLET | Freq: Every day | ORAL | 1 refills | Status: DC

## 2020-09-20 MED ORDER — MIDAZOLAM HCL 2 MG/2ML IJ SOLN
INTRAMUSCULAR | Status: AC
  Filled 2020-09-20: qty 2

## 2020-09-20 MED ORDER — ASPIRIN 81 MG PO CHEW: 81.0000 mg | CHEWABLE_TABLET | ORAL | Status: DC

## 2020-09-20 MED ORDER — SODIUM CHLORIDE 0.9% FLUSH: 3.0000 mL | INTRAVENOUS | Status: DC | PRN

## 2020-09-20 MED ORDER — LABETALOL HCL 5 MG/ML IV SOLN: 10.0000 mg | INTRAVENOUS | Status: AC | PRN

## 2020-09-20 MED ORDER — SODIUM CHLORIDE 0.9% FLUSH: 3.0000 mL | Freq: Two times a day (BID) | INTRAVENOUS | Status: DC

## 2020-09-20 MED ORDER — SPIRONOLACTONE 25 MG PO TABS
25.0000 mg | ORAL_TABLET | Freq: Every day | ORAL | 1 refills | Status: DC
  Filled 2020-09-20: qty 30, 30d supply, fill #0

## 2020-09-20 MED ORDER — SODIUM CHLORIDE 0.9% FLUSH
3.0000 mL | INTRAVENOUS | Status: DC | PRN
  Administered 2020-09-20: 3 mL via INTRAVENOUS

## 2020-09-20 MED ORDER — ATORVASTATIN CALCIUM 80 MG PO TABS
80.0000 mg | ORAL_TABLET | Freq: Every day | ORAL | 1 refills | Status: DC
  Filled 2020-09-20: qty 30, 30d supply, fill #0

## 2020-09-20 MED ORDER — SODIUM CHLORIDE 0.9 % WEIGHT BASED INFUSION
1.0000 mL/kg/h | INTRAVENOUS | Status: DC
  Administered 2020-09-20: 1 mL/kg/h via INTRAVENOUS

## 2020-09-20 MED ORDER — CARVEDILOL 25 MG PO TABS: 25.0000 mg | ORAL_TABLET | Freq: Two times a day (BID) | ORAL | 1 refills | Status: DC

## 2020-09-20 MED ORDER — ASPIRIN 81 MG PO CHEW
81.0000 mg | CHEWABLE_TABLET | ORAL | Status: AC
  Administered 2020-09-20: 81 mg via ORAL

## 2020-09-20 MED ORDER — DAPAGLIFLOZIN PROPANEDIOL 10 MG PO TABS
10.0000 mg | ORAL_TABLET | Freq: Every day | ORAL | Status: DC
  Filled 2020-09-20: qty 1

## 2020-09-20 MED ORDER — VERAPAMIL HCL 2.5 MG/ML IV SOLN
INTRAVENOUS | Status: DC | PRN
  Administered 2020-09-20: 10 mL via INTRA_ARTERIAL

## 2020-09-20 MED ORDER — HYDRALAZINE HCL 20 MG/ML IJ SOLN: 10.0000 mg | INTRAMUSCULAR | Status: AC | PRN

## 2020-09-20 MED ORDER — CLOPIDOGREL BISULFATE 75 MG PO TABS
75.0000 mg | ORAL_TABLET | Freq: Every day | ORAL | 1 refills | Status: DC
  Filled 2020-09-20: qty 30, 30d supply, fill #0

## 2020-09-20 MED ORDER — SODIUM CHLORIDE 0.9 % WEIGHT BASED INFUSION
3.0000 mL/kg/h | INTRAVENOUS | Status: DC
  Administered 2020-09-20: 3 mL/kg/h via INTRAVENOUS

## 2020-09-20 MED ORDER — SPIRONOLACTONE 25 MG PO TABS
25.0000 mg | ORAL_TABLET | Freq: Every day | ORAL | Status: DC
  Administered 2020-09-20: 25 mg via ORAL
  Filled 2020-09-20 (×2): qty 1

## 2020-09-20 MED ORDER — NITROGLYCERIN 0.4 MG SL SUBL
0.4000 mg | SUBLINGUAL_TABLET | SUBLINGUAL | 1 refills | Status: DC | PRN
  Filled 2020-09-20: qty 25, 7d supply, fill #0

## 2020-09-20 MED ORDER — NITROGLYCERIN 0.4 MG SL SUBL: 0.4000 mg | SUBLINGUAL_TABLET | SUBLINGUAL | 1 refills | Status: DC | PRN

## 2020-09-20 MED ORDER — HEPARIN SODIUM (PORCINE) 1000 UNIT/ML IJ SOLN
INTRAMUSCULAR | Status: AC
  Filled 2020-09-20: qty 1

## 2020-09-20 SURGICAL SUPPLY — 10 items
CATH OPTITORQUE TIG 4.0 5F (CATHETERS) ×1 IMPLANT
DEVICE RAD COMP TR BAND LRG (VASCULAR PRODUCTS) ×1 IMPLANT
GLIDESHEATH SLEND SS 6F .021 (SHEATH) ×1 IMPLANT
GUIDEWIRE INQWIRE 1.5J.035X260 (WIRE) IMPLANT
INQWIRE 1.5J .035X260CM (WIRE) ×2
KIT ENCORE 26 ADVANTAGE (KITS) ×1 IMPLANT
KIT HEART LEFT (KITS) ×2 IMPLANT
PACK CARDIAC CATHETERIZATION (CUSTOM PROCEDURE TRAY) ×2 IMPLANT
TRANSDUCER W/STOPCOCK (MISCELLANEOUS) ×2 IMPLANT
TUBING CIL FLEX 10 FLL-RA (TUBING) ×2 IMPLANT

## 2020-09-20 NOTE — Interval H&P Note (Signed)
History and Physical Interval Note:  09/20/2020 11:03 AM  Russell Baldwin  has presented today for surgery, with the diagnosis of unstable angina.  The various methods of treatment have been discussed with the patient and family. After consideration of risks, benefits and other options for treatment, the patient has consented to  Procedure(s): LEFT HEART CATH AND CORONARY ANGIOGRAPHY (N/A) PERCUTANEOUS CORONARY INTERVENTION   as a surgical intervention.  The patient's history has been reviewed, patient examined, no change in status, stable for surgery.  I have reviewed the patient's chart and labs.  Questions were answered to the patient's satisfaction.    Cath Lab Visit (complete for each Cath Lab visit)  Clinical Evaluation Leading to the Procedure:   ACS: Yes.    Non-ACS:    Anginal Classification: CCS IV  Anti-ischemic medical therapy: Minimal Therapy (1 class of medications)  Non-Invasive Test Results: No non-invasive testing performed -reduced EF on echo  Prior CABG: No previous CABG   Bryan Lemma

## 2020-09-20 NOTE — TOC Transition Note (Signed)
Transition of Care [facility]) - CM/SW Discharge Note   Patient Details  Name: Russell Baldwin MRN: [id number] Date of Birth: 07-15-1970  [facility] CM/SW Contact:  Leone Haven, RN Phone Number: 09/20/2020, 2:37 PM   Clinical Narrative:    Patient is for dc today, he has transportation home, person is in the room with him.  He states she has some money with him to get medications. This NCM will ast him with Match Letter to get meds 3.00  each.  A follow up apt has been made for him on AVS as well.     Final next level of care: Home/Self Care Barriers to Discharge: No Barriers Identified   Patient Goals and CMS Choice Patient states their goals for this hospitalization and ongoing recovery are:: return home   Choice offered to / list presented to : NA  Discharge Placement                       Discharge Plan and Services                  DME Agency: NA       HH Arranged: NA          Social Determinants of Health (SDOH) Interventions     Readmission Risk Interventions No flowsheet data found.

## 2020-09-20 NOTE — Discharge Summary (Addendum)
Physician Discharge Summary  Russell Baldwin LHT:[id number] DOB: Jul 15, 1970 DOA: 09/18/2020  PCP: Pcp, No  Admit date: 09/18/2020 Discharge date: 09/20/2020  Recommendations for Outpatient Follow-up:   * Chest pain, rule out acute myocardial infarction --Pain-free.  Troponins flat, less than 40, EKG nonacute.  --Echo showed reduced LVEF and new wall motion abnormality. --s/p LHC, recommendation GMDT for HTN, CAD, ICM, medical management of RCA CTO w/ potential CTO PCI if symptoms warrant as outpatient    Follow-up Information     Corrin Parker, PA-C Follow up on 10/05/2020.   Specialties: Physician Assistant, Cardiology Why: at 8:45AM for your cardiology follow up Contact information: [address] [place] [address] [phone]         [facility]. Go on 10/25/2020.   Specialty: Family Medicine Why: @1 :30pm Contact information: [address] [place] [address] [phone]                 Discharge Diagnoses: Principal diagnosis is #1 Principal Problem:   Chest pain, rule out acute myocardial infarction Active Problems:   Coronary artery disease   Ischemic cardiomyopathy   Essential hypertension   Hyperlipidemia   Hypertensive urgency   Presence of drug coated stent in right coronary artery Chronic systolic CHF  Discharge Condition: improved Disposition: home  Diet recommendation:  Diet Orders (From admission, onward)     Start     Ordered   09/20/20 1144  Diet Heart Room service appropriate? Yes; Fluid consistency: Thin  Diet effective now       Question Answer Comment  Room service appropriate? Yes   Fluid consistency: Thin      09/20/20 1143   09/20/20 0000  Diet - low sodium heart healthy        09/20/20 1418             Filed Weights   09/19/20 0802  Weight: 79.4 kg    HPI/Hospital Course:   50 year old man PMH CAD status post inferior wall STEMI with stent to  RCA 2012, STEMI 2016 with stent to RCA, last LVEF 40% 2016 PRESENTED to ED with chest pain.  Admitted for further evaluation.  No further chest pain, LVEF was depressed, had heart cath results below, recommendation for medical management and discharge home.  * Chest pain, rule out acute myocardial infarction --Pain-free.  Troponins flat, less than 40, EKG nonacute.  --Echo showed reduced LVEF and new wall motion abnormality. --s/p LHC, recommendation GMDT for HTN, CAD, ICM, medical management of RCA CTO w/ potential CTO PCI if symptoms warrant as outpatient  Coronary artery disease -- With PMH of STEMI x2 -- Continue aspirin, atorvastatin, Imdur  Ischemic cardiomyopathy -- Appears compensated.  No diuretic other than spironolactone added --continue BB, ASA, statin, ACE-I on discharge  Essential hypertension -- Stable.  Continue Lipitor, Imdur, lisinopril      Consults:  Cardiology    Procedures:  LHC Severe single-vessel disease with CTO of the proximal to mid RCA just after previously placed stent -> distal vessel fills via bridging collaterals and right to right collaterals. After reviewing images with Dr. Kary Kos and Dr. Geralynn Rile as well as Dr. 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 ->   Large draping left coronary system, but no left-to-right collaterals. Mild-moderately elevated LVEDP of 18 mmHg.  Today's assessment: S: CC: f/u chest pain  No CP Feels fine S/p cath w/ rec for medical managment  O: Vitals:  Vitals:   09/20/20 1051 09/20/20 1155  BP:  (!) 123/91  Pulse:  (!) 51  Resp:    Temp:  98.1 F (36.7 C)  SpO2: 100% 100%    Constitutional:  Appears calm and comfortable Respiratory:  CTA bilaterally, no w/r/r.  Respiratory effort normal.  Cardiovascular:  RRR, no m/r/g Psychiatric:  Mental status Mood, affect appropriate  BMP stable CBC stable  Discharge Instructions  Discharge Instructions     Diet - low  sodium heart healthy   Complete by: As directed    Discharge instructions   Complete by: As directed    Call your physician or seek immediate medical attention for pain, shortness of breath, swelling, headache, passing out or worsening of condition.   Increase activity slowly   Complete by: As directed       Allergies as of 09/20/2020   No Known Allergies      Medication List     STOP taking these medications    metoprolol tartrate 25 MG tablet Commonly known as: LOPRESSOR       TAKE these medications    aspirin 81 MG chewable tablet Chew 1 tablet (81 mg total) by mouth daily.   atorvastatin 80 MG tablet Commonly known as: LIPITOR Take 1 tablet (80 mg total) by mouth daily at 6 PM.   carvedilol 25 MG tablet Commonly known as: COREG Take 1 tablet (25 mg total) by mouth 2 (two) times daily with a meal.   clopidogrel 75 MG tablet Commonly known as: PLAVIX Take 1 tablet (75 mg total) by mouth daily. Start taking on: September 21, 2020   dapagliflozin propanediol 10 MG Tabs tablet Commonly known as: FARXIGA Take 1 tablet (10 mg total) by mouth daily. Start taking on: September 21, 2020   isosorbide mononitrate 30 MG 24 hr tablet Commonly known as: IMDUR Take 1 tablet (30 mg total) by mouth daily.   lisinopril 40 MG tablet Commonly known as: ZESTRIL Take 1 tablet (40 mg total) by mouth daily. Start taking on: September 21, 2020 What changed:  medication strength how much to take   nitroGLYCERIN 0.4 MG SL tablet Commonly known as: NITROSTAT Place 1 tablet (0.4 mg total) under the tongue every 5 (five) minutes as needed for chest pain.   spironolactone 25 MG tablet Commonly known as: ALDACTONE Take 1 tablet (25 mg total) by mouth daily. Start taking on: September 21, 2020       No Known Allergies  The results of significant diagnostics from this hospitalization (including imaging, microbiology, ancillary and laboratory) are listed below for reference.     Significant Diagnostic Studies: DG Chest 2 View  Result Date: 09/19/2020 CLINICAL DATA:  Chest pain EXAM: CHEST - 2 VIEW COMPARISON:  None. FINDINGS: The heart size and mediastinal contours are within normal limits. Both lungs are clear. The visualized skeletal structures are unremarkable. IMPRESSION: No active cardiopulmonary disease. Electronically Signed   By: Deatra Robinson M.D.   On: 09/19/2020 00:42   CARDIAC CATHETERIZATION  Result Date: 09/20/2020   Prox RCA overlapping stents are 60% stenosed.   Prox RCA to Mid RCA lesion is 99% stenosed followed by Mid RCA lesion is 100% stenosed -> likely CTO with bridging and R-R collaterals   Dist RCA lesion is 30% stenosed.   LV end diastolic pressure is moderately elevated.   There is no aortic valve stenosis. SUMMARY Severe single-vessel disease with CTO of the proximal to mid RCA just after previously placed stent -> distal  vessel fills via bridging collaterals and right to right collaterals. After reviewing images with Dr. Kary Kos and Dr. Geralynn Rile as well as Dr. 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 ->  Large draping left coronary system, but no left-to-right collaterals. Mild-moderately elevated LVEDP of 18 mmHg. RECOMMENDATIONS Return to nursing here for ongoing care. Continue to titrate GM DT for hypertension, CAD and ICM Would recommend medical management initially of RCA CTO, with potential CTO PCI if symptoms warrant. Bryan Lemma, MD  ECHOCARDIOGRAM COMPLETE  Result Date: 09/19/2020    ECHOCARDIOGRAM REPORT   Patient Name:   LESSLIE MCKEEHAN Date of Exam: 09/19/2020 Medical Rec #:  [id number]      Height:       70.0 in Accession #:    [id number]     Weight:       175.0 lb Date of Birth:  07/07/1970      BSA:          1.972 m Patient Age:    50 years       BP:           124/74 mmHg Patient Gender: M              HR:           50 bpm. Exam Location:  Inpatient Procedure: 2D Echo Indications:    chest pain. Ischemic  cardiomyopathy  History:        Patient has prior history of Echocardiogram examinations, most                 recent 03/23/2014. CAD; Risk Factors:Hypertension and                 Dyslipidemia.  Sonographer:    Delcie Roch RDCS Referring Phys: [id number] JARED M GARDNER IMPRESSIONS  1. Diffuse hypokinesis, worse in the inferior, inferolateral walls. Left ventricular ejection fraction, by estimation, is 40 to 45%. The left ventricle has mildly decreased function. The left ventricular internal cavity size was moderately dilated. There is mild left ventricular hypertrophy. Left ventricular diastolic parameters are consistent with Grade II diastolic dysfunction (pseudonormalization). Elevated left atrial pressure.  2. Right ventricular systolic function is normal. The right ventricular size is normal.  3. The mitral valve is normal in structure. Mild mitral valve regurgitation.  4. The aortic valve is tricuspid. Aortic valve regurgitation is not visualized. Mild to moderate aortic valve sclerosis/calcification is present, without any evidence of aortic stenosis.  5. The inferior vena cava is normal in size with greater than 50% respiratory variability, suggesting right atrial pressure of 3 mmHg. FINDINGS  Left Ventricle: Diffuse hypokinesis, worse in the inferior, inferolateral walls. Left ventricular ejection fraction, by estimation, is 40 to 45%. The left ventricle has mildly decreased function. The left ventricular internal cavity size was moderately dilated. There is mild left ventricular hypertrophy. Left ventricular diastolic parameters are consistent with Grade II diastolic dysfunction (pseudonormalization). Elevated left atrial pressure. Right Ventricle: The right ventricular size is normal. Right vetricular wall thickness was not assessed. Right ventricular systolic function is normal. Left Atrium: Left atrial size was normal in size. Right Atrium: Right atrial size was normal in size. Pericardium: There is no  evidence of pericardial effusion. Mitral Valve: The mitral valve is normal in structure. Mild mitral valve regurgitation. Tricuspid Valve: The tricuspid valve is normal in structure. Tricuspid valve regurgitation is trivial. Aortic Valve: The aortic valve is tricuspid. Aortic valve regurgitation is not visualized. Mild to moderate  aortic valve sclerosis/calcification is present, without any evidence of aortic stenosis. Pulmonic Valve: The pulmonic valve was normal in structure. Pulmonic valve regurgitation is not visualized. Aorta: The aortic root and ascending aorta are structurally normal, with no evidence of dilitation. Venous: The inferior vena cava is normal in size with greater than 50% respiratory variability, suggesting right atrial pressure of 3 mmHg. IAS/Shunts: No atrial level shunt detected by color flow Doppler.  LEFT VENTRICLE PLAX 2D LVIDd:         5.80 cm      Diastology LVIDs:         4.50 cm      LV e' medial:    4.79 cm/s LV PW:         1.60 cm      LV E/e' medial:  18.5 LV IVS:        1.30 cm      LV e' lateral:   6.64 cm/s LVOT diam:     2.20 cm      LV E/e' lateral: 13.4 LV SV:         69 LV SV Index:   35 LVOT Area:     3.80 cm  LV Volumes (MOD) LV vol d, MOD A2C: 160.0 ml LV vol d, MOD A4C: 164.0 ml LV vol s, MOD A2C: 110.0 ml LV vol s, MOD A4C: 97.6 ml LV SV MOD A2C:     50.0 ml LV SV MOD A4C:     164.0 ml LV SV MOD BP:      63.1 ml RIGHT VENTRICLE         IVC TAPSE (M-mode): 2.3 cm  IVC diam: 1.60 cm LEFT ATRIUM              Index       RIGHT ATRIUM           Index LA diam:        4.40 cm  2.23 cm/m  RA Area:     16.70 cm LA Vol (A2C):   107.0 ml 54.26 ml/m RA Volume:   45.10 ml  22.87 ml/m LA Vol (A4C):   74.6 ml  37.83 ml/m LA Biplane Vol: 88.6 ml  44.93 ml/m  AORTIC VALVE LVOT Vmax:   92.40 cm/s LVOT Vmean:  58.100 cm/s LVOT VTI:    0.182 m  AORTA Ao Root diam: 3.00 cm Ao Asc diam:  3.60 cm MITRAL VALVE MV Area (PHT): 4.24 cm    SHUNTS MV Decel Time: 179 msec    Systemic VTI:   0.18 m MV E velocity: 88.80 cm/s  Systemic Diam: 2.20 cm MV A velocity: 52.60 cm/s MV E/A ratio:  1.69 Dietrich Pates MD Electronically signed by Dietrich Pates MD Signature Date/Time: 09/19/2020/7:52:10 PM    Final     Microbiology: Recent Results (from the past 240 hour(s))  Resp Panel by RT-PCR (Flu A&B, Covid) Nasopharyngeal Swab     Status: None   Collection Time: 09/19/20  2:26 AM   Specimen: Nasopharyngeal Swab; Nasopharyngeal(NP) swabs in vial transport medium  Result Value Ref Range Status   SARS Coronavirus 2 by RT PCR NEGATIVE NEGATIVE Final    Comment: (NOTE) SARS-CoV-2 target nucleic acids are NOT DETECTED.  The SARS-CoV-2 RNA is generally detectable in upper respiratory specimens during the acute phase of infection. The lowest concentration of SARS-CoV-2 viral copies this assay can detect is 138 copies/mL. A negative result does not preclude SARS-Cov-2 infection and should not be used as  the sole basis for treatment or other patient management decisions. A negative result may occur with  improper specimen collection/handling, submission of specimen other than nasopharyngeal swab, presence of viral mutation(s) within the areas targeted by this assay, and inadequate number of viral copies(<138 copies/mL). A negative result must be combined with clinical observations, patient history, and epidemiological information. The expected result is Negative.  Fact Sheet for Patients:  BloggerCourse.com  Fact Sheet for Healthcare Providers:  SeriousBroker.it  This test is no t yet approved or cleared by the [place] FDA and  has been authorized for detection and/or diagnosis of SARS-CoV-2 by FDA under an Emergency Use Authorization (EUA). This EUA will remain  in effect (meaning this test can be used) for the duration of the COVID-19 declaration under Section 564(b)(1) of the Act, 21 U.S.C.section 360bbb-3(b)(1), unless the  authorization is terminated  or revoked sooner.       Influenza A by PCR NEGATIVE NEGATIVE Final   Influenza B by PCR NEGATIVE NEGATIVE Final    Comment: (NOTE) The Xpert Xpress SARS-CoV-2/FLU/RSV plus assay is intended as an aid in the diagnosis of influenza from Nasopharyngeal swab specimens and should not be used as a sole basis for treatment. Nasal washings and aspirates are unacceptable for Xpert Xpress SARS-CoV-2/FLU/RSV testing.  Fact Sheet for Patients: BloggerCourse.com  Fact Sheet for Healthcare Providers: SeriousBroker.it  This test is not yet approved or cleared by the [place] FDA and has been authorized for detection and/or diagnosis of SARS-CoV-2 by FDA under an Emergency Use Authorization (EUA). This EUA will remain in effect (meaning this test can be used) for the duration of the COVID-19 declaration under Section 564(b)(1) of the Act, 21 U.S.C. section 360bbb-3(b)(1), unless the authorization is terminated or revoked.  Performed at [facility], [address], [place], [place] [address]      Labs: Basic Metabolic Panel: Recent Labs  Lab 09/19/20 0001 09/20/20 0553  NA 137 135  K 4.0 3.8  CL 106 104  CO2 23 23  GLUCOSE 108* 96  BUN 11 14  CREATININE 1.14 1.06  CALCIUM 8.8* 8.6*   Liver Function Tests: Recent Labs  Lab 09/19/20 0001  AST 22  ALT 19  ALKPHOS 60  BILITOT 0.7  PROT 6.7  ALBUMIN 4.0   CBC: Recent Labs  Lab 09/19/20 0001 09/20/20 0553  WBC 3.4* 3.1*  NEUTROABS 1.8  --   HGB 13.8 13.4  HCT 44.2 41.1  MCV 83.6 83.0  PLT 229 186   Principal Problem:   Chest pain, rule out acute myocardial infarction Active Problems:   Coronary artery disease   Ischemic cardiomyopathy   Essential hypertension   Hyperlipidemia   Hypertensive urgency   Presence of drug coated stent in right coronary artery   Time coordinating discharge: 35 minutes  Signed:  Brendia Sacks, MD  [facility]  09/20/2020, 3:36 PM

## 2020-09-20 NOTE — Discharge Instructions (Signed)

## 2020-09-20 NOTE — Progress Notes (Signed)
Cardiology Progress Note  Patient ID: Russell Baldwin Every MRN: [id number] DOB: 02-27-1970 Date of Encounter: 09/20/2020  Primary Cardiologist: Reatha Harps, MD  Subjective   Chief Complaint: None.  HPI: Pressure well controlled today.  Denies any further chest pain episodes.  Echocardiogram shows reduced LVEF with RWMA.   ROS:  All other ROS reviewed and negative. Pertinent positives noted in the HPI.     Inpatient Medications  Scheduled Meds:  amLODipine  10 mg Oral Daily   aspirin  81 mg Oral Daily   atorvastatin  80 mg Oral q1800   carvedilol  25 mg Oral BID WC   clopidogrel  75 mg Oral Daily   enoxaparin (LOVENOX) injection  40 mg Subcutaneous Q24H   isosorbide mononitrate  30 mg Oral Daily   lisinopril  40 mg Oral Daily   Continuous Infusions:  PRN Meds: acetaminophen, ondansetron (ZOFRAN) IV   Vital Signs   Vitals:   09/20/20 0239 09/20/20 0400 09/20/20 0637 09/20/20 0639  BP: (!) 132/96 (!) 134/101 (!) 138/97 (!) 138/97  Pulse: (!) 55 60  65  Resp: 11 12 10 19   Temp:      TempSrc:      SpO2: 96% 96% 95% 100%  Weight:      Height:       No intake or output data in the 24 hours ending 09/20/20 0706 Last 3 Weights 09/19/2020 02/03/2016 04/06/2014  Weight (lbs) 175 lb 217 lb 209 lb  Weight (kg) 79.379 kg 98.431 kg 94.802 kg      Telemetry  Overnight telemetry shows SB 50s, which I personally reviewed.   Physical Exam   Vitals:   09/20/20 0239 09/20/20 0400 09/20/20 0637 09/20/20 0639  BP: (!) 132/96 (!) 134/101 (!) 138/97 (!) 138/97  Pulse: (!) 55 60  65  Resp: 11 12 10 19   Temp:      TempSrc:      SpO2: 96% 96% 95% 100%  Weight:      Height:       No intake or output data in the 24 hours ending 09/20/20 0706  Last 3 Weights 09/19/2020 02/03/2016 04/06/2014  Weight (lbs) 175 lb 217 lb 209 lb  Weight (kg) 79.379 kg 98.431 kg 94.802 kg    Body mass index is 25.11 kg/m.  General: Well nourished, well developed, in no acute distress Head: Atraumatic, normal  size  Eyes: PEERLA, EOMI  Neck: Supple, no JVD Endocrine: No thryomegaly Cardiac: Normal S1, S2; RRR; no murmurs, rubs, or gallops Lungs: Clear to auscultation bilaterally, no wheezing, rhonchi or rales  Abd: Soft, nontender, no hepatomegaly  Ext: No edema, pulses 2+ Musculoskeletal: No deformities, BUE and BLE strength normal and equal Skin: Warm and dry, no rashes   Neuro: Alert and oriented to person, place, time, and situation, CNII-XII grossly intact, no focal deficits  Psych: Normal mood and affect   Labs  High Sensitivity Troponin:   Recent Labs  Lab 09/19/20 0001 09/19/20 0200 09/19/20 0430 09/19/20 0652  TROPONINIHS 31* 36* 35* 37*     Cardiac EnzymesNo results for input(s): TROPONINI in the last 168 hours. No results for input(s): TROPIPOC in the last 168 hours.  Chemistry Recent Labs  Lab 09/19/20 0001 09/20/20 0553  NA 137 135  K 4.0 3.8  CL 106 104  CO2 23 23  GLUCOSE 108* 96  BUN 11 14  CREATININE 1.14 1.06  CALCIUM 8.8* 8.6*  PROT 6.7  --   ALBUMIN 4.0  --  AST 22  --   ALT 19  --   ALKPHOS 60  --   BILITOT 0.7  --   GFRNONAA >60 >60  ANIONGAP 8 8    Hematology Recent Labs  Lab 09/19/20 0001 09/20/20 0553  WBC 3.4* 3.1*  RBC 5.29 4.95  HGB 13.8 13.4  HCT 44.2 41.1  MCV 83.6 83.0  MCH 26.1 27.1  MCHC 31.2 32.6  RDW 13.2 13.2  PLT 229 186   BNPNo results for input(s): BNP, PROBNP in the last 168 hours.  DDimer No results for input(s): DDIMER in the last 168 hours.   Radiology  DG Chest 2 View  Result Date: 09/19/2020 CLINICAL DATA:  Chest pain EXAM: CHEST - 2 VIEW COMPARISON:  None. FINDINGS: The heart size and mediastinal contours are within normal limits. Both lungs are clear. The visualized skeletal structures are unremarkable. IMPRESSION: No active cardiopulmonary disease. Electronically Signed   By: Deatra Robinson M.D.   On: 09/19/2020 00:42   ECHOCARDIOGRAM COMPLETE  Result Date: 09/19/2020    ECHOCARDIOGRAM REPORT   Patient  Name:   Russell Baldwin Date of Exam: 09/19/2020 Medical Rec #:  [id number]      Height:       70.0 in Accession #:    [id number]     Weight:       175.0 lb Date of Birth:  19-Apr-1970      BSA:          1.972 m Patient Age:    50 years       BP:           124/74 mmHg Patient Gender: M              HR:           50 bpm. Exam Location:  Inpatient Procedure: 2D Echo Indications:    chest pain. Ischemic cardiomyopathy  History:        Patient has prior history of Echocardiogram examinations, most                 recent 03/23/2014. CAD; Risk Factors:Hypertension and                 Dyslipidemia.  Sonographer:    Delcie Roch RDCS Referring Phys: [phone] JARED M GARDNER IMPRESSIONS  1. Diffuse hypokinesis, worse in the inferior, inferolateral walls. Left ventricular ejection fraction, by estimation, is 40 to 45%. The left ventricle has mildly decreased function. The left ventricular internal cavity size was moderately dilated. There is mild left ventricular hypertrophy. Left ventricular diastolic parameters are consistent with Grade II diastolic dysfunction (pseudonormalization). Elevated left atrial pressure.  2. Right ventricular systolic function is normal. The right ventricular size is normal.  3. The mitral valve is normal in structure. Mild mitral valve regurgitation.  4. The aortic valve is tricuspid. Aortic valve regurgitation is not visualized. Mild to moderate aortic valve sclerosis/calcification is present, without any evidence of aortic stenosis.  5. The inferior vena cava is normal in size with greater than 50% respiratory variability, suggesting right atrial pressure of 3 mmHg. FINDINGS  Left Ventricle: Diffuse hypokinesis, worse in the inferior, inferolateral walls. Left ventricular ejection fraction, by estimation, is 40 to 45%. The left ventricle has mildly decreased function. The left ventricular internal cavity size was moderately dilated. There is mild left ventricular hypertrophy. Left ventricular  diastolic parameters are consistent with Grade II diastolic dysfunction (pseudonormalization). Elevated left atrial pressure. Right Ventricle: The right ventricular size is normal.  Right vetricular wall thickness was not assessed. Right ventricular systolic function is normal. Left Atrium: Left atrial size was normal in size. Right Atrium: Right atrial size was normal in size. Pericardium: There is no evidence of pericardial effusion. Mitral Valve: The mitral valve is normal in structure. Mild mitral valve regurgitation. Tricuspid Valve: The tricuspid valve is normal in structure. Tricuspid valve regurgitation is trivial. Aortic Valve: The aortic valve is tricuspid. Aortic valve regurgitation is not visualized. Mild to moderate aortic valve sclerosis/calcification is present, without any evidence of aortic stenosis. Pulmonic Valve: The pulmonic valve was normal in structure. Pulmonic valve regurgitation is not visualized. Aorta: The aortic root and ascending aorta are structurally normal, with no evidence of dilitation. Venous: The inferior vena cava is normal in size with greater than 50% respiratory variability, suggesting right atrial pressure of 3 mmHg. IAS/Shunts: No atrial level shunt detected by color flow Doppler.  LEFT VENTRICLE PLAX 2D LVIDd:         5.80 cm      Diastology LVIDs:         4.50 cm      LV e' medial:    4.79 cm/s LV PW:         1.60 cm      LV E/e' medial:  18.5 LV IVS:        1.30 cm      LV e' lateral:   6.64 cm/s LVOT diam:     2.20 cm      LV E/e' lateral: 13.4 LV SV:         69 LV SV Index:   35 LVOT Area:     3.80 cm  LV Volumes (MOD) LV vol d, MOD A2C: 160.0 ml LV vol d, MOD A4C: 164.0 ml LV vol s, MOD A2C: 110.0 ml LV vol s, MOD A4C: 97.6 ml LV SV MOD A2C:     50.0 ml LV SV MOD A4C:     164.0 ml LV SV MOD BP:      63.1 ml RIGHT VENTRICLE         IVC TAPSE (M-mode): 2.3 cm  IVC diam: 1.60 cm LEFT ATRIUM              Index       RIGHT ATRIUM           Index LA diam:        4.40 cm   2.23 cm/m  RA Area:     16.70 cm LA Vol (A2C):   107.0 ml 54.26 ml/m RA Volume:   45.10 ml  22.87 ml/m LA Vol (A4C):   74.6 ml  37.83 ml/m LA Biplane Vol: 88.6 ml  44.93 ml/m  AORTIC VALVE LVOT Vmax:   92.40 cm/s LVOT Vmean:  58.100 cm/s LVOT VTI:    0.182 m  AORTA Ao Root diam: 3.00 cm Ao Asc diam:  3.60 cm MITRAL VALVE MV Area (PHT): 4.24 cm    SHUNTS MV Decel Time: 179 msec    Systemic VTI:  0.18 m MV E velocity: 88.80 cm/s  Systemic Diam: 2.20 cm MV A velocity: 52.60 cm/s MV E/A ratio:  1.69 Dietrich Pates MD Electronically signed by Dietrich Pates MD Signature Date/Time: 09/19/2020/7:52:10 PM    Final     Cardiac Studies  TTE 09/19/2020  1. Diffuse hypokinesis, worse in the inferior, inferolateral walls. Left  ventricular ejection fraction, by estimation, is 40 to 45%. The left  ventricle has mildly decreased function. The  left ventricular internal  cavity size was moderately dilated.  There is mild left ventricular hypertrophy. Left ventricular diastolic  parameters are consistent with Grade II diastolic dysfunction  (pseudonormalization). Elevated left atrial pressure.   2. Right ventricular systolic function is normal. The right ventricular  size is normal.   3. The mitral valve is normal in structure. Mild mitral valve  regurgitation.   4. The aortic valve is tricuspid. Aortic valve regurgitation is not  visualized. Mild to moderate aortic valve sclerosis/calcification is  present, without any evidence of aortic stenosis.   5. The inferior vena cava is normal in size with greater than 50%  respiratory variability, suggesting right atrial pressure of 3 mmHg.   Patient Profile  50 year old male with history of hypertension, hyperlipidemia, CAD (STEMI 2012/recurrent PCI to the proximal RCA in 2016) who was admitted to the hospital on 09/19/2020 with chest pain and accelerated hypertension.  Assessment & Plan   # Chest Pain -EKG unchanged. Troponin elevated and flat. Suspect 2/2 HTN.   -Echo with reduced EF 40-45% with inferolateral WMA.  -given new WMA, will proceed with LHC today. NPO. Risks/benefits explained. Willing to proceed. CP free. On aspirin and plavix. No need for heparin given negative troponin.  -continue statin.  -continue imdur.   Shared Decision Making/Informed Consent The risks [stroke (1 in 1000), death (1 in 1000), kidney failure [usually temporary] (1 in 500), bleeding (1 in 200), allergic reaction [possibly serious] (1 in 200)], benefits (diagnostic support and management of coronary artery disease) and alternatives of a cardiac catheterization were discussed in detail with Mr. Vickerman and he is willing to proceed.  #Systolic HF 40-45% with RWMA -suspect HTN driven. Given new WMA, will proceed with LHC.  -no symptoms of CHF -continue coreg 12.5 mg Bid, lisinopril 40 mg daily, stop norvasc. Add aldactone.  -no need for lasix. Euvolemic -add jardiance 10 mg daily.   #HLD -lipitor 80 mg   For questions or updates, please contact [facility] Please consult [email or web address] for contact info under   Time Spent with Patient: I have spent a total of 35 minutes with patient reviewing hospital notes, telemetry, EKGs, labs and examining the patient as well as establishing an assessment and plan that was discussed with the patient.  > 50% of time was spent in direct patient care.    Signed, Lenna Gilford. Flora Lipps, MD, [facility] [place]  [facility]  09/20/2020 7:06 AM

## 2020-09-20 NOTE — H&P (View-Only) (Signed)
Cardiology Progress Note  Patient ID: Russell Baldwin MRN: [id number] DOB: 09/16/1970 Date of Encounter: 09/20/2020  Primary Cardiologist: Radford T O'Neal, MD  Subjective   Chief Complaint: None.  HPI: Pressure well controlled today.  Denies any further chest pain episodes.  Echocardiogram shows reduced LVEF with RWMA.   ROS:  All other ROS reviewed and negative. Pertinent positives noted in the HPI.     Inpatient Medications  Scheduled Meds:  amLODipine  10 mg Oral Daily   aspirin  81 mg Oral Daily   atorvastatin  80 mg Oral q1800   carvedilol  25 mg Oral BID WC   clopidogrel  75 mg Oral Daily   enoxaparin (LOVENOX) injection  40 mg Subcutaneous Q24H   isosorbide mononitrate  30 mg Oral Daily   lisinopril  40 mg Oral Daily   Continuous Infusions:  PRN Meds: acetaminophen, ondansetron (ZOFRAN) IV   Vital Signs   Vitals:   09/20/20 0239 09/20/20 0400 09/20/20 0637 09/20/20 0639  BP: (!) 132/96 (!) 134/101 (!) 138/97 (!) 138/97  Pulse: (!) 55 60  65  Resp: 11 12 10 19  Temp:      TempSrc:      SpO2: 96% 96% 95% 100%  Weight:      Height:       No intake or output data in the 24 hours ending 09/20/20 0706 Last 3 Weights 09/19/2020 02/03/2016 04/06/2014  Weight (lbs) 175 lb 217 lb 209 lb  Weight (kg) 79.379 kg 98.431 kg 94.802 kg      Telemetry  Overnight telemetry shows SB 50s, which I personally reviewed.   Physical Exam   Vitals:   09/20/20 0239 09/20/20 0400 09/20/20 0637 09/20/20 0639  BP: (!) 132/96 (!) 134/101 (!) 138/97 (!) 138/97  Pulse: (!) 55 60  65  Resp: 11 12 10 19  Temp:      TempSrc:      SpO2: 96% 96% 95% 100%  Weight:      Height:       No intake or output data in the 24 hours ending 09/20/20 0706  Last 3 Weights 09/19/2020 02/03/2016 04/06/2014  Weight (lbs) 175 lb 217 lb 209 lb  Weight (kg) 79.379 kg 98.431 kg 94.802 kg    Body mass index is 25.11 kg/m.  General: Well nourished, well developed, in no acute distress Head: Atraumatic, normal  size  Eyes: PEERLA, EOMI  Neck: Supple, no JVD Endocrine: No thryomegaly Cardiac: Normal S1, S2; RRR; no murmurs, rubs, or gallops Lungs: Clear to auscultation bilaterally, no wheezing, rhonchi or rales  Abd: Soft, nontender, no hepatomegaly  Ext: No edema, pulses 2+ Musculoskeletal: No deformities, BUE and BLE strength normal and equal Skin: Warm and dry, no rashes   Neuro: Alert and oriented to person, place, time, and situation, CNII-XII grossly intact, no focal deficits  Psych: Normal mood and affect   Labs  High Sensitivity Troponin:   Recent Labs  Lab 09/19/20 0001 09/19/20 0200 09/19/20 0430 09/19/20 0652  TROPONINIHS 31* 36* 35* 37*     Cardiac EnzymesNo results for input(s): TROPONINI in the last 168 hours. No results for input(s): TROPIPOC in the last 168 hours.  Chemistry Recent Labs  Lab 09/19/20 0001 09/20/20 0553  NA 137 135  K 4.0 3.8  CL 106 104  CO2 23 23  GLUCOSE 108* 96  BUN 11 14  CREATININE 1.14 1.06  CALCIUM 8.8* 8.6*  PROT 6.7  --   ALBUMIN 4.0  --     AST 22  --   ALT 19  --   ALKPHOS 60  --   BILITOT 0.7  --   GFRNONAA >60 >60  ANIONGAP 8 8    Hematology Recent Labs  Lab 09/19/20 0001 09/20/20 0553  WBC 3.4* 3.1*  RBC 5.29 4.95  HGB 13.8 13.4  HCT 44.2 41.1  MCV 83.6 83.0  MCH 26.1 27.1  MCHC 31.2 32.6  RDW 13.2 13.2  PLT 229 186   BNPNo results for input(s): BNP, PROBNP in the last 168 hours.  DDimer No results for input(s): DDIMER in the last 168 hours.   Radiology  DG Chest 2 View  Result Date: 09/19/2020 CLINICAL DATA:  Chest pain EXAM: CHEST - 2 VIEW COMPARISON:  None. FINDINGS: The heart size and mediastinal contours are within normal limits. Both lungs are clear. The visualized skeletal structures are unremarkable. IMPRESSION: No active cardiopulmonary disease. Electronically Signed   By: Deatra Robinson M.D.   On: 09/19/2020 00:42   ECHOCARDIOGRAM COMPLETE  Result Date: 09/19/2020    ECHOCARDIOGRAM REPORT   Patient  Name:   Russell Baldwin Date of Exam: 09/19/2020 Medical Rec #:  [id number]      Height:       70.0 in Accession #:    [id number]     Weight:       175.0 lb Date of Birth:  19-Apr-1970      BSA:          1.972 m Patient Age:    50 years       BP:           124/74 mmHg Patient Gender: M              HR:           50 bpm. Exam Location:  Inpatient Procedure: 2D Echo Indications:    chest pain. Ischemic cardiomyopathy  History:        Patient has prior history of Echocardiogram examinations, most                 recent 03/23/2014. CAD; Risk Factors:Hypertension and                 Dyslipidemia.  Sonographer:    Delcie Roch RDCS Referring Phys: [phone] JARED M GARDNER IMPRESSIONS  1. Diffuse hypokinesis, worse in the inferior, inferolateral walls. Left ventricular ejection fraction, by estimation, is 40 to 45%. The left ventricle has mildly decreased function. The left ventricular internal cavity size was moderately dilated. There is mild left ventricular hypertrophy. Left ventricular diastolic parameters are consistent with Grade II diastolic dysfunction (pseudonormalization). Elevated left atrial pressure.  2. Right ventricular systolic function is normal. The right ventricular size is normal.  3. The mitral valve is normal in structure. Mild mitral valve regurgitation.  4. The aortic valve is tricuspid. Aortic valve regurgitation is not visualized. Mild to moderate aortic valve sclerosis/calcification is present, without any evidence of aortic stenosis.  5. The inferior vena cava is normal in size with greater than 50% respiratory variability, suggesting right atrial pressure of 3 mmHg. FINDINGS  Left Ventricle: Diffuse hypokinesis, worse in the inferior, inferolateral walls. Left ventricular ejection fraction, by estimation, is 40 to 45%. The left ventricle has mildly decreased function. The left ventricular internal cavity size was moderately dilated. There is mild left ventricular hypertrophy. Left ventricular  diastolic parameters are consistent with Grade II diastolic dysfunction (pseudonormalization). Elevated left atrial pressure. Right Ventricle: The right ventricular size is normal.  Right vetricular wall thickness was not assessed. Right ventricular systolic function is normal. Left Atrium: Left atrial size was normal in size. Right Atrium: Right atrial size was normal in size. Pericardium: There is no evidence of pericardial effusion. Mitral Valve: The mitral valve is normal in structure. Mild mitral valve regurgitation. Tricuspid Valve: The tricuspid valve is normal in structure. Tricuspid valve regurgitation is trivial. Aortic Valve: The aortic valve is tricuspid. Aortic valve regurgitation is not visualized. Mild to moderate aortic valve sclerosis/calcification is present, without any evidence of aortic stenosis. Pulmonic Valve: The pulmonic valve was normal in structure. Pulmonic valve regurgitation is not visualized. Aorta: The aortic root and ascending aorta are structurally normal, with no evidence of dilitation. Venous: The inferior vena cava is normal in size with greater than 50% respiratory variability, suggesting right atrial pressure of 3 mmHg. IAS/Shunts: No atrial level shunt detected by color flow Doppler.  LEFT VENTRICLE PLAX 2D LVIDd:         5.80 cm      Diastology LVIDs:         4.50 cm      LV e' medial:    4.79 cm/s LV PW:         1.60 cm      LV E/e' medial:  18.5 LV IVS:        1.30 cm      LV e' lateral:   6.64 cm/s LVOT diam:     2.20 cm      LV E/e' lateral: 13.4 LV SV:         69 LV SV Index:   35 LVOT Area:     3.80 cm  LV Volumes (MOD) LV vol d, MOD A2C: 160.0 ml LV vol d, MOD A4C: 164.0 ml LV vol s, MOD A2C: 110.0 ml LV vol s, MOD A4C: 97.6 ml LV SV MOD A2C:     50.0 ml LV SV MOD A4C:     164.0 ml LV SV MOD BP:      63.1 ml RIGHT VENTRICLE         IVC TAPSE (M-mode): 2.3 cm  IVC diam: 1.60 cm LEFT ATRIUM              Index       RIGHT ATRIUM           Index LA diam:        4.40 cm   2.23 cm/m  RA Area:     16.70 cm LA Vol (A2C):   107.0 ml 54.26 ml/m RA Volume:   45.10 ml  22.87 ml/m LA Vol (A4C):   74.6 ml  37.83 ml/m LA Biplane Vol: 88.6 ml  44.93 ml/m  AORTIC VALVE LVOT Vmax:   92.40 cm/s LVOT Vmean:  58.100 cm/s LVOT VTI:    0.182 m  AORTA Ao Root diam: 3.00 cm Ao Asc diam:  3.60 cm MITRAL VALVE MV Area (PHT): 4.24 cm    SHUNTS MV Decel Time: 179 msec    Systemic VTI:  0.18 m MV E velocity: 88.80 cm/s  Systemic Diam: 2.20 cm MV A velocity: 52.60 cm/s MV E/A ratio:  1.69 Dietrich Pates MD Electronically signed by Dietrich Pates MD Signature Date/Time: 09/19/2020/7:52:10 PM    Final     Cardiac Studies  TTE 09/19/2020  1. Diffuse hypokinesis, worse in the inferior, inferolateral walls. Left  ventricular ejection fraction, by estimation, is 40 to 45%. The left  ventricle has mildly decreased function. The  left ventricular internal  cavity size was moderately dilated.  There is mild left ventricular hypertrophy. Left ventricular diastolic  parameters are consistent with Grade II diastolic dysfunction  (pseudonormalization). Elevated left atrial pressure.   2. Right ventricular systolic function is normal. The right ventricular  size is normal.   3. The mitral valve is normal in structure. Mild mitral valve  regurgitation.   4. The aortic valve is tricuspid. Aortic valve regurgitation is not  visualized. Mild to moderate aortic valve sclerosis/calcification is  present, without any evidence of aortic stenosis.   5. The inferior vena cava is normal in size with greater than 50%  respiratory variability, suggesting right atrial pressure of 3 mmHg.   Patient Profile  50 year old male with history of hypertension, hyperlipidemia, CAD (STEMI 2012/recurrent PCI to the proximal RCA in 2016) who was admitted to the hospital on 09/19/2020 with chest pain and accelerated hypertension.  Assessment & Plan   # Chest Pain -EKG unchanged. Troponin elevated and flat. Suspect 2/2 HTN.   -Echo with reduced EF 40-45% with inferolateral WMA.  -given new WMA, will proceed with LHC today. NPO. Risks/benefits explained. Willing to proceed. CP free. On aspirin and plavix. No need for heparin given negative troponin.  -continue statin.  -continue imdur.   Shared Decision Making/Informed Consent The risks [stroke (1 in 1000), death (1 in 1000), kidney failure [usually temporary] (1 in 500), bleeding (1 in 200), allergic reaction [possibly serious] (1 in 200)], benefits (diagnostic support and management of coronary artery disease) and alternatives of a cardiac catheterization were discussed in detail with Mr. Vickerman and he is willing to proceed.  #Systolic HF 40-45% with RWMA -suspect HTN driven. Given new WMA, will proceed with LHC.  -no symptoms of CHF -continue coreg 12.5 mg Bid, lisinopril 40 mg daily, stop norvasc. Add aldactone.  -no need for lasix. Euvolemic -add jardiance 10 mg daily.   #HLD -lipitor 80 mg   For questions or updates, please contact [facility] Please consult [email or web address] for contact info under   Time Spent with Patient: I have spent a total of 35 minutes with patient reviewing hospital notes, telemetry, EKGs, labs and examining the patient as well as establishing an assessment and plan that was discussed with the patient.  > 50% of time was spent in direct patient care.    Signed, Lenna Gilford. Flora Lipps, MD, [facility] [place]  [facility]  09/20/2020 7:06 AM

## 2020-09-27 NOTE — Progress Notes (Deleted)
Cardiology Office Note:    Date:  09/27/2020   ID:  Russell Baldwin, DOB 27-Oct-1970, MRN [id number]  PCP:  Pcp, No  Cardiologist:  Reatha Harps, MD  Electrophysiologist:  None   Referring MD: No ref. provider found   Chief Complaint: hospital follow-up for chest pain  History of Present Illness:    Russell Baldwin is a 50 y.o. male with a history of CAD with inferior STEMI in 2012 s/p DES to RCA and another STEMI in 2016 s/p DES to prior occluded proximal RCA stent (last cath in 09/2020 showed CTO of RCA with collaterals - treated medically), ischemic cardiomyopathy with EF of 40-45%, hypertension, hyperlipidemia, and prediabetes who is followed by Dr. Flora Lipps and presents today for hospital follow-up for chest pain.  Patient has a history of CAD with prior inferior STEMI in 2012 which was treated with DES to proximal RCA. Patient was lost to follow-up after this hospitalization. He represented in 2016 with another repeat inferior STEMI and was found to have occluded RCA stenting. Underwent successful PCI with DES to prior RCA stent. He was started on Aspirin and Brilinta and felt to need lifelong DAPT. Echo showed LVEF of 40% at that time. He was again lost to follow-up after discharge.   He was not seen again until recent admission from 09/18/2020 to 09/20/2020 for chest pain. Patient had not been taking any of his cardiac medications and had been off DAPT for at least 6 years. High-sensitivity minimally elevated and flat in the 30s not consistent with ACS. Echo showed LVEF of 40-45% with diffuse hypokinesis (worse in the inferior/inferolateral walls), mild LVH, grade 2 diastolic dysfunction, and mild MR. Patient underwent LHC on 09/20/2020 which severe single vessel showed 60% stenosis of proximal RCA overlapping stents closely followed by 99% stenosis of the proximal to mid RCA and then 100% stenosis of mid RCA with bridging and collateral. LVEDP moderately elevated at 18 mmHg. Felt to be CTO  given bridging and collaterals; therefore, medical therapy was recommended. Patient was restarted on DAPT and GDMT and was discharged on Aspirin 81mg  daily, Plavix 75mg  daily, Coreg 25mg  twice daily, Lisinopril 40mg  daily, Imdur 30mg  daily, Spironolactone 25mg  daily, Farxiga 10mg  daily, and Lipitor 80mg  daily.  Patient presents today for follow-up. ***  CAD - History of inferior STEMI in 2012 s/p DES to proximal RCA and then another inferior STEMI in 2016 s/p DES to occluded prior RCA stent. Patient lost to follow-up after both of these admissions. Recently admitted with recurrent chest pain. LHC showed severe single vessel showed 60% stenosis of proximal RCA overlapping stents closely followed by 99% stenosis of the proximal to mid RCA and then 100% stenosis of mid RCA with bridging and collateral. Felt to be CTO given bridging and collaterals. Treated medically. - No recurrent angina.  - Continue DAPT with Aspirin and Plavix. - Continue Coreg 25mg  twice daily, Imdur 30mg  daily, and Lipitor 80mg  daily.   Ischemic Cardiomyopathy - Echo during recent admission showed LVEF of 40-45% with diffuse hypokinesis (worse in the inferior/inferolateral walls), mild LVH, grade 2 diastolic dysfunction, and mild MR. - Euvolemic on exam.  - Continue Coreg 25mg  twice daily.  - Continue Lisinopril 40mg  daily.  - Continue Imdur 30mg  daily.  - Continue Farxiga 10mg  daily.  - Discussed importance of daily weights and sodium/fluid restrictions.  - Will repeat BMET today.  Hypertension - BP well controlled. - Continue medications for CHF as above.  Hyperlipidemia - Lipid panel during recent admission: Total  Cholesterol 278, Triglycerides 35, HDL 65, LDL 206.  - LDL goal <70 given CAD. - Lipitor 80mg  daily restarted during recent admission. Continue. - Will repeat lipid panel/LFTs in 2 months.  Past Medical History:  Diagnosis Date   Coronary artery disease    a. s/p inferior STEMI (Promus DES to proximal  RCA in 2012)  b. inferior STEMI for late ISR s/p DES to RCA    HLD (hyperlipidemia)    HTN (hypertension)    Impaired fasting glucose    Ischemic cardiomyopathy    a. LV gram EF 40% (03/2014); normal EF by echo    Past Surgical History:  Procedure Laterality Date   CARDIAC CATHETERIZATION     LEFT HEART CATH N/A 03/21/2014   Procedure: LEFT HEART CATH;  Surgeon: 03/23/2014, MD;  Location: [facility] CATH LAB;  Service: Cardiovascular;  Laterality: N/A;   LEFT HEART CATH AND CORONARY ANGIOGRAPHY N/A 09/20/2020   Procedure: LEFT HEART CATH AND CORONARY ANGIOGRAPHY;  Surgeon: 09/22/2020, MD;  Location: [facility] INVASIVE CV LAB;  Service: Cardiovascular;  Laterality: N/A;    Current Medications: No outpatient medications have been marked as taking for the 10/05/20 encounter (Appointment) with 12/05/20, PA-C.     Allergies:   Patient has no known allergies.   Social History   Socioeconomic History   Marital status: Divorced    Spouse name: Not on file   Number of children: Not on file   Years of education: Not on file   Highest education level: Not on file  Occupational History   Not on file  Tobacco Use   Smoking status: Never   Smokeless tobacco: Never  Substance and Sexual Activity   Alcohol use: Yes   Drug use: No   Sexual activity: Not on file  Other Topics Concern   Not on file  Social History Narrative   Not on file   Social Determinants of Health   Financial Resource Strain: Not on file  Food Insecurity: Not on file  Transportation Needs: Not on file  Physical Activity: Not on file  Stress: Not on file  Social Connections: Not on file     Family History: The patient's family history includes Diabetes in his father and mother; Heart disease in his father and mother.  ROS:   Please see the history of present illness.     EKGs/Labs/Other Studies Reviewed:    The following studies were reviewed today:  Echocardiogram 09/19/2020: Impressions: 1.  Diffuse hypokinesis, worse in the inferior, inferolateral walls. Left  ventricular ejection fraction, by estimation, is 40 to 45%. The left  ventricle has mildly decreased function. The left ventricular internal  cavity size was moderately dilated.  There is mild left ventricular hypertrophy. Left ventricular diastolic  parameters are consistent with Grade II diastolic dysfunction  (pseudonormalization). Elevated left atrial pressure.   2. Right ventricular systolic function is normal. The right ventricular  size is normal.   3. The mitral valve is normal in structure. Mild mitral valve  regurgitation.   4. The aortic valve is tricuspid. Aortic valve regurgitation is not  visualized. Mild to moderate aortic valve sclerosis/calcification is  present, without any evidence of aortic stenosis.   5. The inferior vena cava is normal in size with greater than 50%  respiratory variability, suggesting right atrial pressure of 3 mmHg.  _______________  Left Cardiac Catheterization 09/20/2020:   Prox RCA overlapping stents are 60% stenosed.   Prox RCA to Mid RCA lesion  is 99% stenosed followed by Mid RCA lesion is 100% stenosed -> likely CTO with bridging and R-R collaterals   Dist RCA lesion is 30% stenosed.   LV end diastolic pressure is moderately elevated.   There is no aortic valve stenosis.   Summary: Severe single-vessel disease with CTO of the proximal to mid RCA just after previously placed stent -> distal vessel fills via bridging collaterals and right to right collaterals. After reviewing images with Dr. Kary Kos and Dr. Geralynn Rile as well as Dr. 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 ->   Large draping left coronary system, but no left-to-right collaterals. Mild-moderately elevated LVEDP of 18 mmHg.   Recommendations: Return to nursing here for ongoing care. Continue to titrate GM DT for hypertension, CAD and ICM Would recommend medical management  initially of RCA CTO, with potential CTO PCI if symptoms warrant.  Diagnostic Dominance: Right   EKG:  EKG not ordered today.   Recent Labs: 09/19/2020: ALT 19 09/20/2020: BUN 14; Creatinine, Ser 1.06; Hemoglobin 13.4; Platelets 186; Potassium 3.8; Sodium 135  Recent Lipid Panel    Component Value Date/Time   CHOL 278 (H) 09/19/2020 0449   TRIG 35 09/19/2020 0449   HDL 65 09/19/2020 0449   CHOLHDL 4.3 09/19/2020 0449   VLDL 7 09/19/2020 0449   LDLCALC 206 (H) 09/19/2020 0449    Physical Exam:    Vital Signs: There were no vitals taken for this visit.    Wt Readings from Last 3 Encounters:  09/19/20 175 lb (79.4 kg)  02/03/16 217 lb (98.4 kg)  04/06/14 209 lb (94.8 kg)     General: 50 y.o. male in no acute distress. HEENT: Normocephalic and atraumatic. Sclera clear. EOMs intact. Neck: Supple. No carotid bruits. No JVD. Heart: *** RRR. Distinct S1 and S2. No murmurs, gallops, or rubs. Radial and distal pedal pulses 2+ and equal bilaterally. Lungs: No increased work of breathing. Clear to ausculation bilaterally. No wheezes, rhonchi, or rales.  Abdomen: Soft, non-distended, and non-tender to palpation. Bowel sounds present in all 4 quadrants.  MSK: Normal strength and tone for age. *** Extremities: No lower extremity edema.    Skin: Warm and dry. Neuro: Alert and oriented x3. No focal deficits. Psych: Normal affect. Responds appropriately.   Assessment:    No diagnosis found.  Plan:     Disposition: Follow up in ***   Medication Adjustments/Labs and Tests Ordered: Current medicines are reviewed at length with the patient today.  Concerns regarding medicines are outlined above.  No orders of the defined types were placed in this encounter.  No orders of the defined types were placed in this encounter.   There are no Patient Instructions on file for this visit.   Signed, Corrin Parker, PA-C  09/27/2020 4:35 PM    [facility]

## 2020-10-04 ENCOUNTER — Encounter: Payer: Self-pay | Admitting: Student

## 2020-10-05 ENCOUNTER — Ambulatory Visit: Payer: Self-pay | Admitting: Student

## 2020-10-06 NOTE — Progress Notes (Signed)
Cardiology Clinic Note   Patient Name: Russell Baldwin Date of Encounter: 10/07/2020  Primary Care Provider:  Pcp, No Primary Cardiologist:  Reatha Harps, MD  Patient Profile    Russell Baldwin 50 year old male presents the clinic today for follow-up evaluation of his coronary artery disease.  Past Medical History    Past Medical History:  Diagnosis Date   Coronary artery disease    a. 2012: inferior STEMI s/p DES to RCA,  b. 2016 inferior STEMI for late ISR s/p DES to RCA, c. LHC 09/2020 severe single vessel CAD with 100% stenosis of mid RCA with bridging and collaterals (felt to be CTO - treated medically)   HLD (hyperlipidemia)    HTN (hypertension)    Ischemic cardiomyopathy    a. LV gram EF 40% (03/2014); normal EF by echo   Prediabetes    Past Surgical History:  Procedure Laterality Date   CARDIAC CATHETERIZATION     LEFT HEART CATH N/A 03/21/2014   Procedure: LEFT HEART CATH;  Surgeon: Runell Gess, MD;  Location: [facility] CATH LAB;  Service: Cardiovascular;  Laterality: N/A;   LEFT HEART CATH AND CORONARY ANGIOGRAPHY N/A 09/20/2020   Procedure: LEFT HEART CATH AND CORONARY ANGIOGRAPHY;  Surgeon: Marykay Lex, MD;  Location: [facility] INVASIVE CV LAB;  Service: Cardiovascular;  Laterality: N/A;    Allergies  No Known Allergies  History of Present Illness    Russell Baldwin has a PMH of essential hypertension, HLD, ischemic cardiomyopathy, coronary artery disease status post STEMI with PCI and DES to his proximal RCA in 2016.  EF 40-45%, G2 DD, mild mitral valve regurgitation.  He was admitted to the hospital on 09/19/2020 with chest discomfort and accelerated hypertension.  He underwent cardiac catheterization 09/20/2020 which showed overlapping proximal RCA stents with 60% stenosis, proximal-mid RCA lesion 99% followed by mid RCA lesion 100% felt to be CTO with bridging and R-R collaterals.  His LVEDP was moderately elevated.  Medical management was recommended.  He  presents to the clinic today for follow-up evaluation states he feels well.  He reports compliance with his medications.  He has not been checking his blood pressure at home.  He was previously not on a cholesterol medicine.  We reviewed his angiography and he expressed understanding.  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  He is also walking in his neighborhood twice per week for 45 minutes.  I have asked him to increase his physical activity to 150 minutes of moderate physical activity per week.  We reviewed the importance of heart healthy low-sodium diet.  His blood pressure at the clinic today is 146/95.  I will start amlodipine 5 mg daily, give him a blood pressure log, give him salty 6 diet sheet, order a BMP, repeat fasting lipids and LFTs in 4 to 6 weeks, and plan follow-up in 1 to 2 months.  Today he denies chest pain, shortness of breath, lower extremity edema, fatigue, palpitations, melena, hematuria, hemoptysis, diaphoresis, weakness, presyncope, syncope, orthopnea, and PND.   Home Medications    Prior to Admission medications   Medication Sig Start Date End Date Taking? Authorizing Provider  aspirin 81 MG chewable tablet Chew 1 tablet (81 mg total) by mouth daily. Patient not taking: Reported on 09/19/2020 03/23/14   Janetta Hora, PA-C  atorvastatin (LIPITOR) 80 MG tablet Take 1 tablet (80 mg total) by mouth daily at 6 PM. 09/20/20   Standley Brooking, MD  carvedilol (COREG) 25 MG  tablet Take 1 tablet (25 mg total) by mouth 2 (two) times daily with a meal. 09/20/20   Standley Brooking, MD  clopidogrel (PLAVIX) 75 MG tablet Take 1 tablet (75 mg total) by mouth daily. 09/21/20   Standley Brooking, MD  dapagliflozin propanediol (FARXIGA) 10 MG TABS tablet Take 1 tablet (10 mg total) by mouth daily. 09/21/20   Standley Brooking, MD  isosorbide mononitrate (IMDUR) 30 MG 24 hr tablet Take 1 tablet (30 mg total) by mouth daily. 09/20/20   Standley Brooking, MD   lisinopril (ZESTRIL) 40 MG tablet Take 1 tablet (40 mg total) by mouth daily. 09/21/20   Standley Brooking, MD  nitroGLYCERIN (NITROSTAT) 0.4 MG SL tablet Place 1 tablet (0.4 mg total) under the tongue every 5 (five) minutes as needed for chest pain. 09/20/20   Standley Brooking, MD  spironolactone (ALDACTONE) 25 MG tablet Take 1 tablet (25 mg total) by mouth daily. 09/21/20   Standley Brooking, MD    Family History    Family History  Problem Relation Age of Onset   Heart disease Mother    Diabetes Mother    Diabetes Father    Heart disease Father    He indicated that his mother is deceased. He indicated that his father is alive.  Social History    Social History   Socioeconomic History   Marital status: Divorced    Spouse name: Not on file   Number of children: Not on file   Years of education: Not on file   Highest education level: Not on file  Occupational History   Not on file  Tobacco Use   Smoking status: Never   Smokeless tobacco: Never  Substance and Sexual Activity   Alcohol use: Yes   Drug use: No   Sexual activity: Not on file  Other Topics Concern   Not on file  Social History Narrative   Not on file   Social Determinants of Health   Financial Resource Strain: Not on file  Food Insecurity: Not on file  Transportation Needs: Not on file  Physical Activity: Not on file  Stress: Not on file  Social Connections: Not on file  Intimate Partner Violence: Not on file     Review of Systems    General:  No chills, fever, night sweats or weight changes.  Cardiovascular:  No chest pain, dyspnea on exertion, edema, orthopnea, palpitations, paroxysmal nocturnal dyspnea. Dermatological: No rash, lesions/masses Respiratory: No cough, dyspnea Urologic: No hematuria, dysuria Abdominal:   No nausea, vomiting, diarrhea, bright red blood per rectum, melena, or hematemesis Neurologic:  No visual changes, wkns, changes in mental status. All other systems reviewed  and are otherwise negative except as noted above.  Physical Exam    VS:  BP (!) 146/95 (BP Location: Left Arm, Patient Position: Sitting, Cuff Size: Normal)   Pulse 60   Ht 5\' 10"  (1.778 m)   Wt 186 lb (84.4 kg)   BMI 26.69 kg/m  , BMI Body mass index is 26.69 kg/m. GEN: Well nourished, well developed, in no acute distress. HEENT: normal. Neck: Supple, no JVD, carotid bruits, or masses. Cardiac: RRR, no murmurs, rubs, or gallops. No clubbing, cyanosis, edema.  Radials/DP/PT 2+ and equal bilaterally.  Respiratory:  Respirations regular and unlabored, clear to auscultation bilaterally. GI: Soft, nontender, nondistended, BS + x 4. MS: no deformity or atrophy. Skin: warm and dry, no rash.  Cath site clean dry intact no drainage mild ecchymosis Neuro:  Strength and sensation are intact. Psych: Normal affect.  Accessory Clinical Findings    Recent Labs: 09/19/2020: ALT 19 09/20/2020: BUN 14; Creatinine, Ser 1.06; Hemoglobin 13.4; Platelets 186; Potassium 3.8; Sodium 135   Recent Lipid Panel    Component Value Date/Time   CHOL 278 (H) 09/19/2020 0449   TRIG 35 09/19/2020 0449   HDL 65 09/19/2020 0449   CHOLHDL 4.3 09/19/2020 0449   VLDL 7 09/19/2020 0449   LDLCALC 206 (H) 09/19/2020 0449    ECG personally reviewed by me today-none today.  Cardiac catheterization 09/20/2020   Prox RCA overlapping stents are 60% stenosed.   Prox RCA to Mid RCA lesion is 99% stenosed followed by Mid RCA lesion is 100% stenosed -> likely CTO with bridging and R-R collaterals   Dist RCA lesion is 30% stenosed.   LV end diastolic pressure is moderately elevated.   There is no aortic valve stenosis.   SUMMARY Severe single-vessel disease with CTO of the proximal to mid RCA just after previously placed stent -> distal vessel fills via bridging collaterals and right to right collaterals. After reviewing images with Dr. Kary Kos and Dr. Geralynn Rile as well as Dr. Flora Lipps, we decided that with bridging  collaterals, this is likely a CTO. ->   Large draping left coronary system, but no left-to-right collaterals. Mild-moderately elevated LVEDP of 18 mmHg.     RECOMMENDATIONS Return to nursing here for ongoing care. Continue to titrate GM DT for hypertension, CAD and ICM Would recommend medical management initially of RCA CTO, with potential CTO PCI if symptoms warrant.     Bryan Lemma, MD  Intervention   Assessment & Plan   1.   Coronary artery disease-denies recent episodes of arm neck back or chest discomfort.  Underwent cardiac catheterization on 09/20/2020 which showed proximal overlapping stents 60% stenosed, proximal-mid RCA lesion 99% stenosed followed by mid RCA lesion 100% stenosed which was felt to be CTO with bridging and R-R collaterals.  Medical management was recommended. Continue carvedilol, atorvastatin, aspirin Heart healthy low-sodium diet-salty 6 given Increase physical activity as tolerated  Chronic systolic CHF-no increased DOE or activity intolerance.  Echocardiogram showed EF 40-45% with R WMA.  This was felt to be driven by hypertension.  During admission he did not show signs of CHF. Continue carvedilol, lisinopril, Aldactone, Farxiga.   Heart healthy low-sodium diet-salty 6 given Increase physical activity as tolerated Order BMP  Essential hypertension-BP today 146/95.  Well-controlled with addition of spironolactone Continue carvedilol, lisinopril, spironolactone Start amlodipine 5 mg daily Heart healthy low-sodium diet-salty 6 given Increase physical activity as tolerated Maintain blood pressure log  Hyperlipidemia-09/19/2020: Cholesterol 278; HDL 65; LDL Cholesterol 206; Triglycerides 35; VLDL 7 Continue atorvastatin, aspirin Heart healthy low-sodium diet-salty 6 given Increase physical activity as tolerated Lidips and lfts in 4-6 weeks  Disposition: Follow-up with Dr. Bufford Buttner in 1-2 months.  Thomasene Ripple. Eyob Godlewski NP-C    10/07/2020, 2:11 PM [facility]  [facility] [address] Office [phone] Fax [phone]  Notice: This dictation was prepared with Dragon dictation along with smaller phrase technology. Any transcriptional errors that result from this process are unintentional and may not be corrected upon review.  I spent 13 minutes examining this patient, reviewing medications, and using patient centered shared decision making involving her cardiac care.  Prior to her visit I spent greater than 20 minutes reviewing her past medical history,  medications, and prior cardiac tests.

## 2020-10-07 ENCOUNTER — Encounter: Payer: Self-pay | Admitting: General Practice

## 2020-10-07 ENCOUNTER — Other Ambulatory Visit: Payer: Self-pay

## 2020-10-07 ENCOUNTER — Ambulatory Visit (INDEPENDENT_AMBULATORY_CARE_PROVIDER_SITE_OTHER): Payer: Self-pay | Admitting: General Practice

## 2020-10-07 VITALS — BP 146/95 | HR 60 | Ht 70.0 in | Wt 186.0 lb

## 2020-10-07 DIAGNOSIS — I2510 Atherosclerotic heart disease of native coronary artery without angina pectoris: Secondary | ICD-10-CM

## 2020-10-07 DIAGNOSIS — I10 Essential (primary) hypertension: Secondary | ICD-10-CM

## 2020-10-07 DIAGNOSIS — E7800 Pure hypercholesterolemia, unspecified: Secondary | ICD-10-CM

## 2020-10-07 DIAGNOSIS — Z79899 Other long term (current) drug therapy: Secondary | ICD-10-CM

## 2020-10-07 DIAGNOSIS — I5022 Chronic systolic (congestive) heart failure: Secondary | ICD-10-CM

## 2020-10-07 MED ORDER — AMLODIPINE BESYLATE 5 MG PO TABS: 5.0000 mg | ORAL_TABLET | Freq: Every day | ORAL | 3 refills | Status: DC

## 2020-10-07 NOTE — Patient Instructions (Signed)
Medication Instructions:  START AMLODIPINE 5MG  DAILY  *If you need a refill on your cardiac medications before your next appointment, please call your pharmacy*  Lab Work:    BMET TODAY AND FASTING LIPID PANEL IN 4-6 WEEKS  Special Instructions PLEASE READ AND FOLLOW SALTY 6-ATTACHED-1,800mg  daily  PLEASE INCREASE PHYSICAL ACTIVITY AS TOLERATED, YOUR GOAL IS 150 MINUTES WEEKLY.  TAKE YOU BLOOD PRESSURE 1 HOUR AFTER TAKING YOUR MEDICATION.  TAKE AND LOG YOUR BLOOD PRESSURE AND BRING LOG WITH YOU TO YOUR FOLLOW UP APPOINTMENT FOR TO REVIEW.  Follow-Up: Your next appointment:  1-2 month(s) In Person with [place], MD OR IF UNAVAILABLE JESSE CLEAVER, FNP-C  At [facility], you and your health needs are our priority.  As part of our continuing mission to provide you with exceptional heart care, we have created designated Provider Care Teams.  These Care Teams include your primary Cardiologist (physician) and Advanced Practice Providers (APPs -  Physician Assistants and Nurse Practitioners) who all work together to provide you with the care you need, when you need it.  We recommend signing up for the patient portal called "MyChart".  Sign up information is provided on this After Visit Summary.  MyChart is used to connect with patients for Virtual Visits (Telemedicine).  Patients are able to view lab/test results, encounter notes, upcoming appointments, etc.  Non-urgent messages can be sent to your provider as well.   To learn more about what you can do with MyChart, go to [facility].              6 SALTY THINGS TO AVOID     1,800MG  DAILY

## 2020-10-10 ENCOUNTER — Other Ambulatory Visit (HOSPITAL_COMMUNITY): Payer: Self-pay

## 2020-10-10 ENCOUNTER — Telehealth (HOSPITAL_COMMUNITY): Payer: Self-pay

## 2020-10-10 NOTE — Telephone Encounter (Signed)
Transitions of Care Pharmacy  ° °Call attempted for a pharmacy transitions of care follow-up. HIPAA appropriate voicemail was left with call back information provided.  ° °Call attempt #1. Will follow-up in 2-3 days.  °  °

## 2020-10-11 ENCOUNTER — Other Ambulatory Visit (HOSPITAL_COMMUNITY): Payer: Self-pay

## 2020-10-11 ENCOUNTER — Telehealth (HOSPITAL_COMMUNITY): Payer: Self-pay | Admitting: Pharmacist

## 2020-10-11 NOTE — Telephone Encounter (Signed)
Transitions of Care Pharmacy   Call attempted for a pharmacy transitions of care follow-up. HIPAA appropriate voicemail was left with call back information provided.   Call attempt #2. Will follow-up in 2-3 days.    

## 2020-10-12 ENCOUNTER — Other Ambulatory Visit (HOSPITAL_COMMUNITY): Payer: Self-pay

## 2020-10-12 ENCOUNTER — Telehealth (HOSPITAL_COMMUNITY): Payer: Self-pay | Admitting: Pharmacist

## 2020-10-12 NOTE — Telephone Encounter (Signed)
Transitions of Care Pharmacy   Call attempted for a pharmacy transitions of care follow-up. HIPAA appropriate voicemail was left with call back information provided.   Call attempt #3.  No further attempts at this time.

## 2020-10-25 ENCOUNTER — Inpatient Hospital Stay (INDEPENDENT_AMBULATORY_CARE_PROVIDER_SITE_OTHER): Payer: Self-pay | Admitting: Primary Care

## 2020-12-05 NOTE — Progress Notes (Deleted)
Cardiology Office Note:   Date:  12/05/2020  NAME:  Russell Baldwin    MRN: [id number] DOB:  April 25, 1970   PCP:  Oneita Hurt, No  Cardiologist:  Reatha Harps, MD  Electrophysiologist:  None   Referring MD: No ref. provider found   No chief complaint on file. ***  History of Present Illness:   Russell Baldwin is a 50 y.o. male with a hx of CAD, CHF, HTN, HLD who presents for follow-up.   Problem List CAD -STEMI 2012 pRCA -09/2020: RCA CTO  2. Systolic HF -EF 40-45% 3. HTN 4. HLD -T chol 278, HDL 65, LDL 208, TG 35  Past Medical History: Past Medical History:  Diagnosis Date   Coronary artery disease    a. 2012: inferior STEMI s/p DES to RCA,  b. 2016 inferior STEMI for late ISR s/p DES to RCA, c. LHC 09/2020 severe single vessel CAD with 100% stenosis of mid RCA with bridging and collaterals (felt to be CTO - treated medically)   HLD (hyperlipidemia)    HTN (hypertension)    Ischemic cardiomyopathy    a. LV gram EF 40% (03/2014); normal EF by echo   Prediabetes     Past Surgical History: Past Surgical History:  Procedure Laterality Date   CARDIAC CATHETERIZATION     LEFT HEART CATH N/A 03/21/2014   Procedure: LEFT HEART CATH;  Surgeon: Runell Gess, MD;  Location: [facility] CATH LAB;  Service: Cardiovascular;  Laterality: N/A;   LEFT HEART CATH AND CORONARY ANGIOGRAPHY N/A 09/20/2020   Procedure: LEFT HEART CATH AND CORONARY ANGIOGRAPHY;  Surgeon: Marykay Lex, MD;  Location: [facility] INVASIVE CV LAB;  Service: Cardiovascular;  Laterality: N/A;    Current Medications: No outpatient medications have been marked as taking for the 12/06/20 encounter (Appointment) with O'Neal, Ronnald Ramp, MD.     Allergies:    Patient has no known allergies.   Social History: Social History   Socioeconomic History   Marital status: Divorced    Spouse name: Not on file   Number of children: Not on file   Years of education: Not on file   Highest education level: Not on file  Occupational  History   Not on file  Tobacco Use   Smoking status: Never   Smokeless tobacco: Never  Substance and Sexual Activity   Alcohol use: Yes   Drug use: No   Sexual activity: Not on file  Other Topics Concern   Not on file  Social History Narrative   Not on file   Social Determinants of Health   Financial Resource Strain: Not on file  Food Insecurity: Not on file  Transportation Needs: Not on file  Physical Activity: Not on file  Stress: Not on file  Social Connections: Not on file     Family History: The patient's ***family history includes Diabetes in his father and mother; Heart disease in his father and mother.  ROS:   All other ROS reviewed and negative. Pertinent positives noted in the HPI.     EKGs/Labs/Other Studies Reviewed:   The following studies were personally reviewed by me today:  EKG:  EKG is *** ordered today.  The ekg ordered today demonstrates ***, and was personally reviewed by me.   TTE 09/19/2020 1. Diffuse hypokinesis, worse in the inferior, inferolateral walls. Left  ventricular ejection fraction, by estimation, is 40 to 45%. The left  ventricle has mildly decreased function. The left ventricular internal  cavity size was moderately dilated.  There  is mild left ventricular hypertrophy. Left ventricular diastolic  parameters are consistent with Grade II diastolic dysfunction  (pseudonormalization). Elevated left atrial pressure.   2. Right ventricular systolic function is normal. The right ventricular  size is normal.   3. The mitral valve is normal in structure. Mild mitral valve  regurgitation.   4. The aortic valve is tricuspid. Aortic valve regurgitation is not  visualized. Mild to moderate aortic valve sclerosis/calcification is  present, without any evidence of aortic stenosis.   5. The inferior vena cava is normal in size with greater than 50%  respiratory variability, suggesting right atrial pressure of 3 mmHg.   LHC 09/20/2020   Prox RCA  overlapping stents are 60% stenosed.   Prox RCA to Mid RCA lesion is 99% stenosed followed by Mid RCA lesion is 100% stenosed -> likely CTO with bridging and R-R collaterals   Dist RCA lesion is 30% stenosed.   LV end diastolic pressure is moderately elevated.   There is no aortic valve stenosis.   Recent Labs: 09/19/2020: ALT 19 09/20/2020: BUN 14; Creatinine, Ser 1.06; Hemoglobin 13.4; Platelets 186; Potassium 3.8; Sodium 135   Recent Lipid Panel    Component Value Date/Time   CHOL 278 (H) 09/19/2020 0449   TRIG 35 09/19/2020 0449   HDL 65 09/19/2020 0449   CHOLHDL 4.3 09/19/2020 0449   VLDL 7 09/19/2020 0449   LDLCALC 206 (H) 09/19/2020 0449    Physical Exam:   VS:  There were no vitals taken for this visit.   Wt Readings from Last 3 Encounters:  10/07/20 186 lb (84.4 kg)  09/19/20 175 lb (79.4 kg)  02/03/16 217 lb (98.4 kg)    General: Well nourished, well developed, in no acute distress Head: Atraumatic, normal size  Eyes: PEERLA, EOMI  Neck: Supple, no JVD Endocrine: No thryomegaly Cardiac: Normal S1, S2; RRR; no murmurs, rubs, or gallops Lungs: Clear to auscultation bilaterally, no wheezing, rhonchi or rales  Abd: Soft, nontender, no hepatomegaly  Ext: No edema, pulses 2+ Musculoskeletal: No deformities, BUE and BLE strength normal and equal Skin: Warm and dry, no rashes   Neuro: Alert and oriented to person, place, time, and situation, CNII-XII grossly intact, no focal deficits  Psych: Normal mood and affect   ASSESSMENT:   Russell Baldwin is a 51 y.o. male who presents for the following: No diagnosis found.  PLAN:   There are no diagnoses linked to this encounter.  {Are you ordering a CV Procedure (e.g. stress test, cath, DCCV, TEE, etc)?   Press F2        :[id number]}  Disposition: No follow-ups on file.  Medication Adjustments/Labs and Tests Ordered: Current medicines are reviewed at length with the patient today.  Concerns regarding medicines are  outlined above.  No orders of the defined types were placed in this encounter.  No orders of the defined types were placed in this encounter.   There are no Patient Instructions on file for this visit.   Time Spent with Patient: I have spent a total of *** minutes with patient reviewing hospital notes, telemetry, EKGs, labs and examining the patient as well as establishing an assessment and plan that was discussed with the patient.  > 50% of time was spent in direct patient care.  Signed, Lenna Gilford. Flora Lipps, MD, [facility]  [facility]  [address] [place], [place] [address] [phone]  12/05/2020 7:57 PM

## 2020-12-06 ENCOUNTER — Ambulatory Visit: Payer: Self-pay | Admitting: Cardiovascular Disease

## 2020-12-06 DIAGNOSIS — I2510 Atherosclerotic heart disease of native coronary artery without angina pectoris: Secondary | ICD-10-CM

## 2020-12-06 DIAGNOSIS — I10 Essential (primary) hypertension: Secondary | ICD-10-CM

## 2020-12-06 DIAGNOSIS — I5022 Chronic systolic (congestive) heart failure: Secondary | ICD-10-CM

## 2020-12-06 DIAGNOSIS — E782 Mixed hyperlipidemia: Secondary | ICD-10-CM

## 2021-03-07 ENCOUNTER — Telehealth: Payer: Self-pay | Admitting: *Deleted

## 2021-03-07 NOTE — Telephone Encounter (Signed)
I called patient to let him know about a new research study called Prevail study. This study is looking at new study drug that lowers LDL. I left message for patient to call me back. I will e-mail patient also. ?

## 2022-01-05 ENCOUNTER — Emergency Department (HOSPITAL_COMMUNITY): Payer: No Typology Code available for payment source

## 2022-01-05 ENCOUNTER — Other Ambulatory Visit: Payer: Self-pay

## 2022-01-05 ENCOUNTER — Inpatient Hospital Stay (HOSPITAL_COMMUNITY)
Admission: EM | Admit: 2022-01-05 | Discharge: 2022-01-10 | DRG: 291 | Disposition: A | Discharge status: Home / Self Care | Payer: No Typology Code available for payment source | Attending: Internal Medicine | Admitting: Internal Medicine

## 2022-01-05 DIAGNOSIS — I5082 Biventricular heart failure: Secondary | ICD-10-CM | POA: Diagnosis present

## 2022-01-05 DIAGNOSIS — I5023 Acute on chronic systolic (congestive) heart failure: Secondary | ICD-10-CM | POA: Diagnosis not present

## 2022-01-05 DIAGNOSIS — Z7982 Long term (current) use of aspirin: Secondary | ICD-10-CM

## 2022-01-05 DIAGNOSIS — Z8249 Family history of ischemic heart disease and other diseases of the circulatory system: Secondary | ICD-10-CM

## 2022-01-05 DIAGNOSIS — I255 Ischemic cardiomyopathy: Secondary | ICD-10-CM | POA: Diagnosis present

## 2022-01-05 DIAGNOSIS — U071 COVID-19: Secondary | ICD-10-CM | POA: Diagnosis not present

## 2022-01-05 DIAGNOSIS — I509 Heart failure, unspecified: Secondary | ICD-10-CM

## 2022-01-05 DIAGNOSIS — I161 Hypertensive emergency: Secondary | ICD-10-CM | POA: Diagnosis present

## 2022-01-05 DIAGNOSIS — N179 Acute kidney failure, unspecified: Secondary | ICD-10-CM | POA: Diagnosis present

## 2022-01-05 DIAGNOSIS — Z955 Presence of coronary angioplasty implant and graft: Secondary | ICD-10-CM

## 2022-01-05 DIAGNOSIS — Z91148 Patient's other noncompliance with medication regimen for other reason: Secondary | ICD-10-CM

## 2022-01-05 DIAGNOSIS — Z7902 Long term (current) use of antithrombotics/antiplatelets: Secondary | ICD-10-CM

## 2022-01-05 DIAGNOSIS — I2699 Other pulmonary embolism without acute cor pulmonale: Secondary | ICD-10-CM | POA: Diagnosis present

## 2022-01-05 DIAGNOSIS — I110 Hypertensive heart disease with heart failure: Principal | ICD-10-CM | POA: Diagnosis present

## 2022-01-05 DIAGNOSIS — I252 Old myocardial infarction: Secondary | ICD-10-CM

## 2022-01-05 DIAGNOSIS — Z79899 Other long term (current) drug therapy: Secondary | ICD-10-CM

## 2022-01-05 DIAGNOSIS — I272 Pulmonary hypertension, unspecified: Secondary | ICD-10-CM | POA: Diagnosis present

## 2022-01-05 DIAGNOSIS — E785 Hyperlipidemia, unspecified: Secondary | ICD-10-CM | POA: Diagnosis present

## 2022-01-05 DIAGNOSIS — I2694 Multiple subsegmental pulmonary emboli without acute cor pulmonale: Secondary | ICD-10-CM | POA: Diagnosis present

## 2022-01-05 DIAGNOSIS — I2510 Atherosclerotic heart disease of native coronary artery without angina pectoris: Secondary | ICD-10-CM | POA: Diagnosis present

## 2022-01-05 DIAGNOSIS — I10 Essential (primary) hypertension: Secondary | ICD-10-CM | POA: Diagnosis present

## 2022-01-05 LAB — CBC WITH DIFFERENTIAL/PLATELET
Abs Immature Granulocytes: 0.04 10*3/uL (ref 0.00–0.07)
Basophils Absolute: 0 10*3/uL (ref 0.0–0.1)
Basophils Relative: 0 %
Eosinophils Absolute: 0 10*3/uL (ref 0.0–0.5)
Eosinophils Relative: 0 %
HCT: 35.5 % — ABNORMAL LOW (ref 39.0–52.0)
Hemoglobin: 10.6 g/dL — ABNORMAL LOW (ref 13.0–17.0)
Immature Granulocytes: 1 %
Lymphocytes Relative: 6 %
Lymphs Abs: 0.5 10*3/uL — ABNORMAL LOW (ref 0.7–4.0)
MCH: 20.9 pg — ABNORMAL LOW (ref 26.0–34.0)
MCHC: 29.9 g/dL — ABNORMAL LOW (ref 30.0–36.0)
MCV: 70.2 fL — ABNORMAL LOW (ref 80.0–100.0)
Monocytes Absolute: 0.7 10*3/uL (ref 0.1–1.0)
Monocytes Relative: 9 %
Neutro Abs: 7.1 10*3/uL (ref 1.7–7.7)
Neutrophils Relative %: 84 %
Platelets: 318 10*3/uL (ref 150–400)
RBC: 5.06 MIL/uL (ref 4.22–5.81)
RDW: 18.4 % — ABNORMAL HIGH (ref 11.5–15.5)
WBC: 8.4 10*3/uL (ref 4.0–10.5)
nRBC: 0 % (ref 0.0–0.2)

## 2022-01-05 LAB — RESP PANEL BY RT-PCR (RSV, FLU A&B, COVID)  RVPGX2
Influenza A by PCR: NEGATIVE
Influenza B by PCR: NEGATIVE
Resp Syncytial Virus by PCR: NEGATIVE
SARS Coronavirus 2 by RT PCR: POSITIVE — AB

## 2022-01-05 LAB — COMPREHENSIVE METABOLIC PANEL
ALT: 88 U/L — ABNORMAL HIGH (ref 0–44)
AST: 60 U/L — ABNORMAL HIGH (ref 15–41)
Albumin: 3.2 g/dL — ABNORMAL LOW (ref 3.5–5.0)
Alkaline Phosphatase: 54 U/L (ref 38–126)
Anion gap: 10 (ref 5–15)
BUN: 13 mg/dL (ref 6–20)
CO2: 23 mmol/L (ref 22–32)
Calcium: 8.6 mg/dL — ABNORMAL LOW (ref 8.9–10.3)
Chloride: 102 mmol/L (ref 98–111)
Creatinine, Ser: 1.29 mg/dL — ABNORMAL HIGH (ref 0.61–1.24)
GFR, Estimated: >60 mL/min (ref >60)
Glucose, Bld: 112 mg/dL — ABNORMAL HIGH (ref 70–99)
Potassium: 3.7 mmol/L (ref 3.5–5.1)
Sodium: 135 mmol/L (ref 135–145)
Total Bilirubin: 1.6 mg/dL — ABNORMAL HIGH (ref 0.3–1.2)
Total Protein: 6.2 g/dL — ABNORMAL LOW (ref 6.5–8.1)

## 2022-01-05 LAB — TROPONIN I (HIGH SENSITIVITY): Troponin I (High Sensitivity): 163 ng/L (ref <18)

## 2022-01-05 LAB — BRAIN NATRIURETIC PEPTIDE: B Natriuretic Peptide: 2538.5 pg/mL — ABNORMAL HIGH (ref 0.0–100.0)

## 2022-01-05 MED ORDER — ASPIRIN 81 MG PO CHEW
324.0000 mg | CHEWABLE_TABLET | Freq: Once | ORAL | Status: AC
  Administered 2022-01-05: 324 mg via ORAL
  Filled 2022-01-05: qty 4

## 2022-01-05 MED ORDER — FUROSEMIDE 10 MG/ML IJ SOLN
40.0000 mg | Freq: Once | INTRAMUSCULAR | Status: AC
  Administered 2022-01-05: 40 mg via INTRAVENOUS
  Filled 2022-01-05: qty 4

## 2022-01-05 MED ORDER — NITROGLYCERIN 0.4 MG SL SUBL
0.4000 mg | SUBLINGUAL_TABLET | SUBLINGUAL | Status: AC | PRN
  Administered 2022-01-10 (×3): 0.4 mg via SUBLINGUAL

## 2022-01-05 NOTE — H&P (Addendum)
History and Physical    Russell Baldwin LOV:[id number] DOB: [birth year] DOA: 01/05/2022  PCP: Pcp, No  Patient coming from: home  I have personally briefly reviewed patient's old medical records in [facility] Link  Chief Complaint: sob/chest pain and leg swelling  HPI: Russell Baldwin is a 52 y.o. male with medical history significant of  h/o CAD s/p inferior wall STEMI, DES to RCA in 2012.  Late ISR caused repeat STEMI in 2016, DES to RCA.  HTN, HLD, ICM with EF 40% in 2016. Patient p/w chest pain 09/2020 has evaluation with LHC found have  severe single vessel CAD with 100% stenosis of mid RCA with collaterals that was managed medically. Patient now presents to ED with swelling in lower extremity / PND/orthopnea/ sob and chest discomfort with cough. He notes his sob and swelling started around 4 days ago . He states he had similar episode around thanksgiving but this all resolved on its own. He notes he has been noncompliant with all his medications x 1 year.   He notes no fever /chills/ n/v/d or dysuria.  ED Course:  Tmx 99, bp 174/131, hr 107 , rr 18 sat 97% on ra  Resp panel + covid  Labs:wbc 8.4, hgb 10.6 (13.4) , mcv 70  Bnp 2538 CE 163,164 Na 135, K 3.7, gl 102, cr 1.29( 1.06), tbili 1.6 Cxr Cardiomegaly. Central pulmonary vessels are more prominent suggesting CHF. Increased markings are seen in right lower lung fields suggesting atelectasis/pneumonia. Part of this finding may be due to asymmetric pulmonary edema. Small right pleural effusion is seen.  Tx asa ,lasix 40 mg Review of Systems: As per HPI otherwise 10 point review of systems negative.   Past Medical History:  Diagnosis Date   Coronary artery disease    a. 2012: inferior STEMI s/p DES to RCA,  b. 2016 inferior STEMI for late ISR s/p DES to RCA, c. LHC 09/2020 severe single vessel CAD with 100% stenosis of mid RCA with bridging and collaterals (felt to be CTO - treated medically)   HLD (hyperlipidemia)    HTN  (hypertension)    Ischemic cardiomyopathy    a. LV gram EF 40% (03/2014); normal EF by echo   Prediabetes     Past Surgical History:  Procedure Laterality Date   CARDIAC CATHETERIZATION     LEFT HEART CATH N/A 03/21/2014   Procedure: LEFT HEART CATH;  Surgeon: Runell Gess, MD;  Location: [facility] CATH LAB;  Service: Cardiovascular;  Laterality: N/A;   LEFT HEART CATH AND CORONARY ANGIOGRAPHY N/A 09/20/2020   Procedure: LEFT HEART CATH AND CORONARY ANGIOGRAPHY;  Surgeon: Marykay Lex, MD;  Location: [facility] INVASIVE CV LAB;  Service: Cardiovascular;  Laterality: N/A;     reports that he has never smoked. He has never used smokeless tobacco. He reports current alcohol use. He reports that he does not use drugs.  No Known Allergies  Family History  Problem Relation Age of Onset   Heart disease Mother    Diabetes Mother    Diabetes Father    Heart disease Father     Prior to Admission medications   Medication Sig Start Date End Date Taking? Authorizing Provider  amLODipine (NORVASC) 5 MG tablet Take 1 tablet (5 mg total) by mouth daily. Patient not taking: Reported on 01/05/2022 10/07/20   Corrin Parker, PA-C  aspirin 81 MG chewable tablet Chew 1 tablet (81 mg total) by mouth daily. Patient not taking: Reported on 01/05/2022 03/23/14   Janee Morn,  Woodfin Ganja, PA-C  atorvastatin (LIPITOR) 80 MG tablet Take 1 tablet (80 mg total) by mouth daily at 6 PM. Patient not taking: Reported on 01/05/2022 09/20/20   Samuella Cota, MD  carvedilol (COREG) 25 MG tablet Take 1 tablet (25 mg total) by mouth 2 (two) times daily with a meal. Patient not taking: Reported on 01/05/2022 09/20/20   Samuella Cota, MD  clopidogrel (PLAVIX) 75 MG tablet Take 1 tablet (75 mg total) by mouth daily. Patient not taking: Reported on 01/05/2022 09/21/20   Samuella Cota, MD  dapagliflozin propanediol (FARXIGA) 10 MG TABS tablet Take 1 tablet (10 mg total) by mouth daily. Patient not taking: Reported on 01/05/2022  09/21/20   Samuella Cota, MD  isosorbide mononitrate (IMDUR) 30 MG 24 hr tablet Take 1 tablet (30 mg total) by mouth daily. Patient not taking: Reported on 01/05/2022 09/20/20   Samuella Cota, MD  lisinopril (ZESTRIL) 40 MG tablet Take 1 tablet (40 mg total) by mouth daily. Patient not taking: Reported on 01/05/2022 09/21/20   Samuella Cota, MD  nitroGLYCERIN (NITROSTAT) 0.4 MG SL tablet Place 1 tablet (0.4 mg total) under the tongue every 5 (five) minutes as needed for chest pain. 09/20/20   Samuella Cota, MD  spironolactone (ALDACTONE) 25 MG tablet Take 1 tablet (25 mg total) by mouth daily. Patient not taking: Reported on 01/05/2022 09/21/20   Samuella Cota, MD    Physical Exam: Vitals:   01/05/22 2052 01/05/22 2200 01/05/22 2230 01/05/22 2300  BP: (!) 174/131 (!) 163/119 (!) 165/118 (!) 164/119  Pulse: (!) 107 (!) 107 100 96  Resp: 18 (!) 26 20 (!) 23  Temp: 99 F (37.2 C)     TempSrc: Oral     SpO2: 97% 97% 96% 97%    Constitutional: NAD, calm, comfortable Vitals:   01/05/22 2052 01/05/22 2200 01/05/22 2230 01/05/22 2300  BP: (!) 174/131 (!) 163/119 (!) 165/118 (!) 164/119  Pulse: (!) 107 (!) 107 100 96  Resp: 18 (!) 26 20 (!) 23  Temp: 99 F (37.2 C)     TempSrc: Oral     SpO2: 97% 97% 96% 97%   Eyes: PERRL, lids and conjunctivae normal ENMT: Mucous membranes are moist. Posterior pharynx clear of any exudate or lesions.Normal dentition.  Neck: normal, supple, no masses, no thyromegaly Respiratory: +crackles. Normal respiratory effort. No accessory muscle use.  Cardiovascular: Regular rate and rhythm, no murmurs / rubs / gallops. 3+extremity edema. 2+ pedal pulses. No carotid bruits.  Abdomen: no tenderness, no masses palpated. No hepatosplenomegaly. Bowel sounds positive.  Musculoskeletal: no clubbing / cyanosis. No joint deformity upper and lower extremities. Good ROM, no contractures. Normal muscle tone.  Skin: no rashes, lesions, ulcers. No  induration Neurologic: CN 2-12 grossly intact. Sensation intact, Strength 5/5 in all 4.  Psychiatric: Normal judgment and insight. Alert and oriented x 3. Normal mood.    Labs on Admission: I have personally reviewed following labs and imaging studies  CBC: Recent Labs  Lab 01/05/22 2116  WBC 8.4  NEUTROABS 7.1  HGB 10.6*  HCT 35.5*  MCV 70.2*  PLT 482   Basic Metabolic Panel: Recent Labs  Lab 01/05/22 2116  NA 135  K 3.7  CL 102  CO2 23  GLUCOSE 112*  BUN 13  CREATININE 1.29*  CALCIUM 8.6*   GFR: CrCl cannot be calculated (Unknown ideal weight.). Liver Function Tests: Recent Labs  Lab 01/05/22 2116  AST 60*  ALT 88*  ALKPHOS 54  BILITOT 1.6*  PROT 6.2*  ALBUMIN 3.2*   No results for input(s): "LIPASE", "AMYLASE" in the last 168 hours. No results for input(s): "AMMONIA" in the last 168 hours. Coagulation Profile: No results for input(s): "INR", "PROTIME" in the last 168 hours. Cardiac Enzymes: No results for input(s): "CKTOTAL", "CKMB", "CKMBINDEX", "TROPONINI" in the last 168 hours. BNP (last 3 results) No results for input(s): "PROBNP" in the last 8760 hours. HbA1C: No results for input(s): "HGBA1C" in the last 72 hours. CBG: No results for input(s): "GLUCAP" in the last 168 hours. Lipid Profile: No results for input(s): "CHOL", "HDL", "LDLCALC", "TRIG", "CHOLHDL", "LDLDIRECT" in the last 72 hours. Thyroid Function Tests: No results for input(s): "TSH", "T4TOTAL", "FREET4", "T3FREE", "THYROIDAB" in the last 72 hours. Anemia Panel: No results for input(s): "VITAMINB12", "FOLATE", "FERRITIN", "TIBC", "IRON", "RETICCTPCT" in the last 72 hours. Urine analysis:    Component Value Date/Time   COLORURINE YELLOW 05/23/2010 1009   [place] 05/23/2010 1009   LABSPEC >=1.030 08/27/2012 1820   PHURINE 6.0 08/27/2012 1820   GLUCOSEU NEGATIVE 08/27/2012 1820   HGBUR NEGATIVE 08/27/2012 1820   BILIRUBINUR NEGATIVE 08/27/2012 1820   KETONESUR  NEGATIVE 08/27/2012 1820   PROTEINUR 30 (A) 08/27/2012 1820   UROBILINOGEN 0.2 08/27/2012 1820   NITRITE NEGATIVE 08/27/2012 1820   LEUKOCYTESUR NEGATIVE 08/27/2012 1820    Radiological Exams on Admission: DG Chest 2 View  Result Date: 01/05/2022 CLINICAL DATA:  Shortness of breath EXAM: CHEST - 2 VIEW COMPARISON:  09/19/2020 FINDINGS: Transverse diameter of heart is increased. Central pulmonary vessels are more prominent. Increased interstitial markings are seen in parahilar regions and lower lung fields. Linear patchy densities are seen in right lower lung field. There is blunting of right lateral CP angle. There is no pneumothorax. IMPRESSION: Cardiomegaly. Central pulmonary vessels are more prominent suggesting CHF. Increased markings are seen in right lower lung fields suggesting atelectasis/pneumonia. Part of this finding may be due to asymmetric pulmonary edema. Small right pleural effusion is seen. Electronically Signed   By: Elmer Picker M.D.   On: 01/05/2022 21:27    EKG: Independently reviewed.   Assessment/Plan  Acute exacerbation of CHFref  -hx of ischemic CMY with EF 40%  -admit to progressive care  - place on chf protocol  - iv lasix bid  -resume spironolactone  Chest pain r/o MI  -hx of CAD s/p stent x 2  - last LHC with RCA with CTO with collaterals managed medically  - continue BB, ASA, statin, ACE-I  -currently CE 167, continue to trend  -Cardiology fellow Dr Norma Fredrickson consulted no rec for heparin at this time   COVID-19 infection -place on precautions  -unclear how much of his sob is related to [place] -patient is not hypoxic  -he notes cough since thanksgiving  -unclear timing of his infection  - place on respiratory precautions  - place on antiviral due to risk factors  -monitor sat  -check covid disease severity labs  -CT chest for further evaluation.   Essential HTN concern for Hypertensive Urgency  -continue home regimen  -add prn as  uncontrolled  -if remains uncontrolled may require nitro drip   DVT prophylaxis: heparin Code Status: full/ as discussed per patient wishes in event of cardiac arrest  Family Communication: patient  expected to be admitted greater than 2 midnights  Disposition Plan: patient  expected to be admitted greater than 2 midnights  Consults called: Cardiology Dr Norma Fredrickson Admission status: progressive care    Victoriano Lain  Clovis Pu MD [facility]   If 7PM-7AM, please contact night-coverage [email or web address] Password [email or web address]  01/05/2022, 11:59 PM

## 2022-01-05 NOTE — ED Triage Notes (Signed)
Patient reports pain across his chest with SOB and legs swelling onset this evening , history of CAD / Coronary stent .

## 2022-01-05 NOTE — ED Provider Triage Note (Signed)
Emergency Medicine Provider Triage Evaluation Note  Russell Baldwin , a 52 y.o. male  was evaluated in triage.  Pt complains of chest tightness and shortness of breath and lower extremity swelling.  Patient states symptoms began approximately over the past 3 to 4 days.  Notes history of similar occurrence over Thanksgiving of which resolved independently of intervention.  Denies history of heart failure.  Reports chest tightness constant since onset.  Denies fever, chills, cough, congestion, abdominal pain, nausea, vomiting..  Review of Systems  Positive: See above Negative:   Physical Exam  BP (!) 174/131 (BP Location: Right Arm)   Pulse (!) 107   Temp 99 F (37.2 C) (Oral)   Resp 18   SpO2 97%  Gen:   Awake, no distress   Resp:  Normal effort  MSK:   Moves extremities without difficulty  Other:  Bilateral lower extremity edema appreciated.  Medical Decision Making  Medically screening exam initiated at 9:10 PM.  Appropriate orders placed.  Russell Baldwin was informed that the remainder of the evaluation will be completed by another provider, this initial triage assessment does not replace that evaluation, and the importance of remaining in the ED until their evaluation is complete.     Wilnette Kales, [place] 01/05/22 2111

## 2022-01-05 NOTE — ED Provider Notes (Signed)
[facility] EMERGENCY DEPARTMENT Provider Note   CSN: [id number] Arrival date & time: 01/05/22  2020     History  Chief Complaint  Patient presents with   Chest Pain    Tirrell Buchberger is a 52 y.o. male.  52 yo M with a chief complaints of leg swelling difficulty breathing while laying flat and epigastric and left-sided chest discomfort.  This been going on for about 3 to 4 days.  He had something similar happened about Thanksgiving and then it spontaneously got better.  He has never had a problem like this before.  Has had 2 heart attacks in the past.  Has had 2 stents placed.  Had an episode about a year ago and his blood pressure got really high he thinks this feels somewhat similar.  He denies cough congestion or fever.  Denies nausea vomiting or diarrhea.   Chest Pain      Home Medications Prior to Admission medications   Medication Sig Start Date End Date Taking? Authorizing Provider  amLODipine (NORVASC) 5 MG tablet Take 1 tablet (5 mg total) by mouth daily. Patient not taking: Reported on 01/05/2022 10/07/20   Darreld Mclean, PA-C  aspirin 81 MG chewable tablet Chew 1 tablet (81 mg total) by mouth daily. Patient not taking: Reported on 01/05/2022 03/23/14   Eileen Stanford, PA-C  atorvastatin (LIPITOR) 80 MG tablet Take 1 tablet (80 mg total) by mouth daily at 6 PM. Patient not taking: Reported on 01/05/2022 09/20/20   Samuella Cota, MD  carvedilol (COREG) 25 MG tablet Take 1 tablet (25 mg total) by mouth 2 (two) times daily with a meal. Patient not taking: Reported on 01/05/2022 09/20/20   Samuella Cota, MD  clopidogrel (PLAVIX) 75 MG tablet Take 1 tablet (75 mg total) by mouth daily. Patient not taking: Reported on 01/05/2022 09/21/20   Samuella Cota, MD  dapagliflozin propanediol (FARXIGA) 10 MG TABS tablet Take 1 tablet (10 mg total) by mouth daily. Patient not taking: Reported on 01/05/2022 09/21/20   Samuella Cota, MD  isosorbide  mononitrate (IMDUR) 30 MG 24 hr tablet Take 1 tablet (30 mg total) by mouth daily. Patient not taking: Reported on 01/05/2022 09/20/20   Samuella Cota, MD  lisinopril (ZESTRIL) 40 MG tablet Take 1 tablet (40 mg total) by mouth daily. Patient not taking: Reported on 01/05/2022 09/21/20   Samuella Cota, MD  nitroGLYCERIN (NITROSTAT) 0.4 MG SL tablet Place 1 tablet (0.4 mg total) under the tongue every 5 (five) minutes as needed for chest pain. 09/20/20   Samuella Cota, MD  spironolactone (ALDACTONE) 25 MG tablet Take 1 tablet (25 mg total) by mouth daily. Patient not taking: Reported on 01/05/2022 09/21/20   Samuella Cota, MD      Allergies    Patient has no known allergies.    Review of Systems   Review of Systems  Cardiovascular:  Positive for chest pain.    Physical Exam Updated Vital Signs BP (!) 164/119   Pulse 96   Temp 99 F (37.2 C) (Oral)   Resp (!) 23   SpO2 97%  Physical Exam Vitals and nursing note reviewed.  Constitutional:      Appearance: He is well-developed.  HENT:     Head: Normocephalic and atraumatic.  Eyes:     Pupils: Pupils are equal, round, and reactive to light.  Neck:     Vascular: JVD present.     Comments: JVD to  the mid neck Cardiovascular:     Rate and Rhythm: Normal rate and regular rhythm.     Heart sounds: No murmur heard.    No friction rub. No gallop.  Pulmonary:     Effort: No respiratory distress.     Breath sounds: No wheezing.  Chest:     Chest wall: No edema.  Abdominal:     General: There is no distension.     Tenderness: There is no abdominal tenderness. There is no guarding or rebound.  Musculoskeletal:        General: Normal range of motion.     Cervical back: Normal range of motion and neck supple.     Right lower leg: Edema present.     Left lower leg: Edema present.     Comments: 3+ edema to bilateral lower extremities up to the thighs.  Skin:    Coloration: Skin is not pale.     Findings: No rash.   Neurological:     Mental Status: He is alert and oriented to person, place, and time.  Psychiatric:        Behavior: Behavior normal.     ED Results / Procedures / Treatments   Labs (all labs ordered are listed, but only abnormal results are displayed) Labs Reviewed  RESP PANEL BY RT-PCR (RSV, FLU A&B, COVID)  RVPGX2 - Abnormal; Notable for the following components:      Result Value   SARS Coronavirus 2 by RT PCR POSITIVE (*)    All other components within normal limits  BRAIN NATRIURETIC PEPTIDE - Abnormal; Notable for the following components:   B Natriuretic Peptide 2,538.5 (*)    All other components within normal limits  COMPREHENSIVE METABOLIC PANEL - Abnormal; Notable for the following components:   Glucose, Bld 112 (*)    Creatinine, Ser 1.29 (*)    Calcium 8.6 (*)    Total Protein 6.2 (*)    Albumin 3.2 (*)    AST 60 (*)    ALT 88 (*)    Total Bilirubin 1.6 (*)    All other components within normal limits  CBC WITH DIFFERENTIAL/PLATELET - Abnormal; Notable for the following components:   Hemoglobin 10.6 (*)    HCT 35.5 (*)    MCV 70.2 (*)    MCH 20.9 (*)    MCHC 29.9 (*)    RDW 18.4 (*)    Lymphs Abs 0.5 (*)    All other components within normal limits  TROPONIN I (HIGH SENSITIVITY) - Abnormal; Notable for the following components:   Troponin I (High Sensitivity) 163 (*)    All other components within normal limits  I-STAT CHEM 8, ED  TROPONIN I (HIGH SENSITIVITY)    EKG EKG Interpretation  Date/Time:  Friday January 05 2022 20:37:55 EST Ventricular Rate:  104 PR Interval:  146 QRS Duration: 100 QT Interval:  352 QTC Calculation: 462 R Axis:   103 Text Interpretation: Sinus tachycardia Rightward axis Left ventricular hypertrophy with repolarization abnormality ( Cornell product ) Abnormal ECG No significant change since last tracing Confirmed by Deno Etienne [phone]) on 01/05/2022 9:35:15 PM  Radiology DG Chest 2 View  Result Date: 01/05/2022 CLINICAL  DATA:  Shortness of breath EXAM: CHEST - 2 VIEW COMPARISON:  09/19/2020 FINDINGS: Transverse diameter of heart is increased. Central pulmonary vessels are more prominent. Increased interstitial markings are seen in parahilar regions and lower lung fields. Linear patchy densities are seen in right lower lung field. There is blunting of  right lateral CP angle. There is no pneumothorax. IMPRESSION: Cardiomegaly. Central pulmonary vessels are more prominent suggesting CHF. Increased markings are seen in right lower lung fields suggesting atelectasis/pneumonia. Part of this finding may be due to asymmetric pulmonary edema. Small right pleural effusion is seen. Electronically Signed   By: Ernie Avena M.D.   On: 01/05/2022 21:27    Procedures .Critical Care  Performed by: Melene Plan, DO Authorized by: Melene Plan, DO   Critical care provider statement:    Critical care time (minutes):  35   Critical care time was exclusive of:  Separately billable procedures and treating other patients   Critical care was time spent personally by me on the following activities:  Development of treatment plan with patient or surrogate, discussions with consultants, evaluation of patient's response to treatment, examination of patient, ordering and review of laboratory studies, ordering and review of radiographic studies, ordering and performing treatments and interventions, pulse oximetry, re-evaluation of patient's condition and review of old charts   Care discussed with: admitting provider       Medications Ordered in ED Medications  nitroGLYCERIN (NITROSTAT) SL tablet 0.4 mg (has no administration in time range)  furosemide (LASIX) injection 40 mg (has no administration in time range)  aspirin chewable tablet 324 mg (has no administration in time range)    ED Course/ Medical Decision Making/ A&P Clinical Course as of 01/05/22 2340  Fri Jan 05, 2022  2146 DG Chest 2 View [AR]    Clinical Course User  Index [AR] Myles Lipps                           Medical Decision Making Risk OTC drugs. Prescription drug management.   52 yo M with a chief complaints of orthopnea PND lower extremity edema chest pain.  This has been going on for about 4 days now.  The chest pain is atypical starts in the epigastrium and goes into the left and then the right chest.  Does not feel like his prior MIs.  Initial troponins 163.  BNP 2500 which fits the clinical picture.  Will give a bolus dose of Lasix.  I discussed the case with Dr. Brayton Layman, cardiology fellow on-call.  Recommends medical admission.  The patients results and plan were reviewed and discussed.   Any x-rays performed were independently reviewed by myself.   Differential diagnosis were considered with the presenting HPI.  Medications  nitroGLYCERIN (NITROSTAT) SL tablet 0.4 mg (has no administration in time range)  furosemide (LASIX) injection 40 mg (has no administration in time range)  aspirin chewable tablet 324 mg (has no administration in time range)    Vitals:   01/05/22 2052 01/05/22 2200 01/05/22 2230 01/05/22 2300  BP: (!) 174/131 (!) 163/119 (!) 165/118 (!) 164/119  Pulse: (!) 107 (!) 107 100 96  Resp: 18 (!) 26 20 (!) 23  Temp: 99 F (37.2 C)     TempSrc: Oral     SpO2: 97% 97% 96% 97%    Final diagnoses:  Acute on chronic systolic heart failure (HCC)    Admission/ observation were discussed with the admitting physician, patient and/or family and they are comfortable with the plan.          Final Clinical Impression(s) / ED Diagnoses Final diagnoses:  Acute on chronic systolic heart failure (HCC)    Rx / DC Orders ED Discharge Orders     None  Melene Plan, DO 01/05/22 2340

## 2022-01-05 NOTE — Consult Note (Signed)
Cardiology Consultation   Patient ID: Russell Baldwin MRN: [id number]; DOB: [birth year]  Admit date: 01/05/2022 Date of Consult: 01/05/2022  PCP:  Kathyrn Lass   [facility] Providers Cardiologist:  Evalina Field, MD   { Click here to update MD or APP on Care Team, Refresh:1}     Patient Profile:   Russell Baldwin is a 52 y.o. male with a hx of essential hypertension, HLD, ischemic cardiomyopathy, coronary artery disease status post STEMI with PCI and DES to his proximal RCA in 2016. EF 40-45%, G2 DD, mild mitral valve regurgitation. who is being seen 01/05/2022 for the evaluation of cough, SOB at the request of Dr Tyrone Nine.  History of Present Illness:   Russell Baldwin is a 52 y.o. male with a hx of essential hypertension, HLD, ischemic cardiomyopathy, coronary artery disease status post STEMI with PCI and DES to his proximal RCA in 2016. EF 40-45%, G2 DD, mild mitral valve regurgitation. who is being seen 01/05/2022 for the evaluation of cough, SOB at the request of Dr Tyrone Nine.  Admitted for CHF exacerbation and COVID+  Patient presenting with c/o SOB, leg swelling for 3-4 days cardiac catheterization 09/20/2020 which showed overlapping proximal RCA stents with 60% stenosis, proximal-mid RCA lesion 99% followed by mid RCA lesion 100% felt to be CTO with bridging and R-R collaterals.   ER: HR 95, BP 174/131--> improved to 164/133mmHg, on RA CXR shows pulm edema, right LL-?PNA vs asymmetric edema and cardiomegaly Cr is 1.29 (baseline is 1) BNP 2,539 Trop 163 COVID +  Past Medical History:  Diagnosis Date   Coronary artery disease    a. 2012: inferior STEMI s/p DES to RCA,  b. 2016 inferior STEMI for late ISR s/p DES to RCA, c. LHC 09/2020 severe single vessel CAD with 100% stenosis of mid RCA with bridging and collaterals (felt to be CTO - treated medically)   HLD (hyperlipidemia)    HTN (hypertension)    Ischemic cardiomyopathy    a. LV gram EF 40% (03/2014); normal EF by echo    Prediabetes     Past Surgical History:  Procedure Laterality Date   CARDIAC CATHETERIZATION     LEFT HEART CATH N/A 03/21/2014   Procedure: LEFT HEART CATH;  Surgeon: Lorretta Harp, MD;  Location: [facility] CATH LAB;  Service: Cardiovascular;  Laterality: N/A;   LEFT HEART CATH AND CORONARY ANGIOGRAPHY N/A 09/20/2020   Procedure: LEFT HEART CATH AND CORONARY ANGIOGRAPHY;  Surgeon: Leonie Man, MD;  Location: [facility] CV LAB;  Service: Cardiovascular;  Laterality: N/A;     Home Medications:  Prior to Admission medications   Medication Sig Start Date End Date Taking? Authorizing Provider  amLODipine (NORVASC) 5 MG tablet Take 1 tablet (5 mg total) by mouth daily. Patient not taking: Reported on 01/05/2022 10/07/20   Darreld Mclean, PA-C  aspirin 81 MG chewable tablet Chew 1 tablet (81 mg total) by mouth daily. Patient not taking: Reported on 01/05/2022 03/23/14   Eileen Stanford, PA-C  atorvastatin (LIPITOR) 80 MG tablet Take 1 tablet (80 mg total) by mouth daily at 6 PM. Patient not taking: Reported on 01/05/2022 09/20/20   Samuella Cota, MD  carvedilol (COREG) 25 MG tablet Take 1 tablet (25 mg total) by mouth 2 (two) times daily with a meal. Patient not taking: Reported on 01/05/2022 09/20/20   Samuella Cota, MD  clopidogrel (PLAVIX) 75 MG tablet Take 1 tablet (75 mg total) by mouth daily. Patient not taking:  Reported on 01/05/2022 09/21/20   Samuella Cota, MD  dapagliflozin propanediol (FARXIGA) 10 MG TABS tablet Take 1 tablet (10 mg total) by mouth daily. Patient not taking: Reported on 01/05/2022 09/21/20   Samuella Cota, MD  isosorbide mononitrate (IMDUR) 30 MG 24 hr tablet Take 1 tablet (30 mg total) by mouth daily. Patient not taking: Reported on 01/05/2022 09/20/20   Samuella Cota, MD  lisinopril (ZESTRIL) 40 MG tablet Take 1 tablet (40 mg total) by mouth daily. Patient not taking: Reported on 01/05/2022 09/21/20   Samuella Cota, MD  nitroGLYCERIN (NITROSTAT) 0.4  MG SL tablet Place 1 tablet (0.4 mg total) under the tongue every 5 (five) minutes as needed for chest pain. 09/20/20   Samuella Cota, MD  spironolactone (ALDACTONE) 25 MG tablet Take 1 tablet (25 mg total) by mouth daily. Patient not taking: Reported on 01/05/2022 09/21/20   Samuella Cota, MD    Inpatient Medications: Scheduled Meds:  aspirin  324 mg Oral Once   furosemide  40 mg Intravenous Once   Continuous Infusions:  PRN Meds: nitroGLYCERIN  Allergies:   No Known Allergies  Social History:   Social History   Socioeconomic History   Marital status: Divorced    Spouse name: Not on file   Number of children: Not on file   Years of education: Not on file   Highest education level: Not on file  Occupational History   Not on file  Tobacco Use   Smoking status: Never   Smokeless tobacco: Never  Substance and Sexual Activity   Alcohol use: Yes   Drug use: No   Sexual activity: Not on file  Other Topics Concern   Not on file  Social History Narrative   Not on file   Social Determinants of Health   Financial Resource Strain: Not on file  Food Insecurity: Not on file  Transportation Needs: Not on file  Physical Activity: Not on file  Stress: Not on file  Social Connections: Not on file  Intimate Partner Violence: Not on file    Family History:    Family History  Problem Relation Age of Onset   Heart disease Mother    Diabetes Mother    Diabetes Father    Heart disease Father      ROS:  Please see the history of present illness.   All other ROS reviewed and negative.     Physical Exam/Data:   Vitals:   01/05/22 2052 01/05/22 2200  BP: (!) 174/131 (!) 163/119  Pulse: (!) 107 (!) 107  Resp: 18 (!) 26  Temp: 99 F (37.2 C)   TempSrc: Oral   SpO2: 97% 97%   No intake or output data in the 24 hours ending 01/05/22 2326    10/07/2020    1:45 PM 09/19/2020    8:02 AM 02/03/2016   12:59 AM  Last 3 Weights  Weight (lbs) 186 lb 175 lb 217 lb   Weight (kg) 84.369 kg 79.379 kg 98.431 kg     There is no height or weight on file to calculate BMI.  General:  Well nourished, well developed, in no acute distress HEENT: normal Neck: no JVD Vascular: No carotid bruits; Distal pulses 2+ bilaterally Cardiac:  normal S1, S2; RRR; no murmur  Lungs:  clear to auscultation bilaterally, no wheezing, rhonchi or rales  Abd: soft, nontender, no hepatomegaly  Ext: no edema Musculoskeletal:  No deformities, BUE and BLE strength normal and equal Skin:  warm and dry  Neuro:  CNs 2-12 intact, no focal abnormalities noted Psych:  Normal affect   EKG:  The EKG was personally reviewed and demonstrates:  NSR, RAD, non specific ST- T wave changes, LVH with repolarization abn  Relevant CV Studies: ECHO: 09/2020 IMPRESSIONS     1. Diffuse hypokinesis, worse in the inferior, inferolateral walls. Left  ventricular ejection fraction, by estimation, is 40 to 45%. The left  ventricle has mildly decreased function. The left ventricular internal  cavity size was moderately dilated.  There is mild left ventricular hypertrophy. Left ventricular diastolic  parameters are consistent with Grade II diastolic dysfunction  (pseudonormalization). Elevated left atrial pressure.   2. Right ventricular systolic function is normal. The right ventricular  size is normal.   3. The mitral valve is normal in structure. Mild mitral valve  regurgitation.   4. The aortic valve is tricuspid. Aortic valve regurgitation is not  visualized. Mild to moderate aortic valve sclerosis/calcification is  present, without any evidence of aortic stenosis.   5. The inferior vena cava is normal in size with greater than 50%  respiratory variability, suggesting right atrial pressure of 3 mmHg.   LHC: 9/22 SUMMARY Severe single-vessel disease with CTO of the proximal to mid RCA just after previously placed stent -> distal vessel fills via bridging collaterals and right to right  collaterals. After reviewing images with Dr. Sophronia Simas and Dr. Hillery Hunter as well as Dr. 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 ->   Large draping left coronary system, but no left-to-right collaterals. Mild-moderately elevated LVEDP of 18 mmHg.     RECOMMENDATIONS Return to nursing here for ongoing care. Continue to titrate GM DT for hypertension, CAD and ICM Would recommend medical management initially of RCA CTO, with potential CTO PCI if symptoms warrant.  Laboratory Data: eft Main  Vessel was injected. Vessel is large. Vessel is angiographically normal.    Ramus Intermedius  Vessel is large.    Left Circumflex    Second Obtuse Marginal Branch  Vessel is small in size.    Third Obtuse Marginal Branch  Vessel is small in size.    Right Coronary Artery  Vessel is large.  Collaterals  Mid RCA filled by collaterals from Acute Mrg.    Prox RCA lesion is 60% stenosed. The lesion is eccentric. The lesion was previously treated using a drug eluting stent between 1-2 years ago. Overlapping stents-initial stent 2012, follow-up stent 2016  Prox RCA to Mid RCA lesion is 99% stenosed. The lesion is irregular and chronically occluded.  Mid RCA lesion is 100% stenosed. The lesion is chronically occluded with bridging collateral flow.  Dist RCA lesion is 30% stenosed. Poorly visualized, cannot exclude disease.    Acute Marginal Branch  Vessel is small in size.    Right Ventricular Branch  Vessel is small in size.    Right Posterior Atrioventricular Artery  Collaterals  RPAV filled by collaterals from RV Branch.      High Sensitivity Troponin:   Recent Labs  Lab 01/05/22 2116  TROPONINIHS 163*     Chemistry Recent Labs  Lab 01/05/22 2116  NA 135  K 3.7  CL 102  CO2 23  GLUCOSE 112*  BUN 13  CREATININE 1.29*  CALCIUM 8.6*  GFRNONAA >60  ANIONGAP 10    Recent Labs  Lab 01/05/22 2116  PROT 6.2*  ALBUMIN 3.2*  AST 60*  ALT 88*   ALKPHOS 54  BILITOT 1.6*  Lipids No results for input(s): "CHOL", "TRIG", "HDL", "LABVLDL", "LDLCALC", "CHOLHDL" in the last 168 hours.  Hematology Recent Labs  Lab 01/05/22 2116  WBC 8.4  RBC 5.06  HGB 10.6*  HCT 35.5*  MCV 70.2*  MCH 20.9*  MCHC 29.9*  RDW 18.4*  PLT 318   Thyroid No results for input(s): "TSH", "FREET4" in the last 168 hours.  BNP Recent Labs  Lab 01/05/22 2116  BNP 2,538.5*    DDimer No results for input(s): "DDIMER" in the last 168 hours.   Radiology/Studies:  DG Chest 2 View  Result Date: 01/05/2022 CLINICAL DATA:  Shortness of breath EXAM: CHEST - 2 VIEW COMPARISON:  09/19/2020 FINDINGS: Transverse diameter of heart is increased. Central pulmonary vessels are more prominent. Increased interstitial markings are seen in parahilar regions and lower lung fields. Linear patchy densities are seen in right lower lung field. There is blunting of right lateral CP angle. There is no pneumothorax. IMPRESSION: Cardiomegaly. Central pulmonary vessels are more prominent suggesting CHF. Increased markings are seen in right lower lung fields suggesting atelectasis/pneumonia. Part of this finding may be due to asymmetric pulmonary edema. Small right pleural effusion is seen. Electronically Signed   By: Elmer Picker M.D.   On: 01/05/2022 21:27     Assessment and Plan:   Ac on chronic HFrEF, EF  40-45% COVID +, cough CAD with CATH 9/22- mid RCA 100% CTO with R-R collaterals, prior RCA stents, ISR, med mx HTN, HLD  Plan: - IV lasix 40mg  bid, goal UO 2lts - trend trops. - continue home medications aspirin, coreg, lisinopril and spiro, atorva - CHF pathway, fluid restriction and low salt diet - ECHO in am - Continue Rx of COVID per hospitalist team  Will follow along   Risk Assessment/Risk Scores:  {Complete the following score calculators/questions to meet required metrics.  Press F2         :[id number]      New York Heart Association (NYHA)  Functional Class NYHA Class IV        For questions or updates, please contact [place] Please consult [email or web address] for contact info under    Signed, Renae Fickle, MD  01/05/2022 11:26 PM

## 2022-01-05 NOTE — H&P (Incomplete)
History and Physical    Russell Baldwin JSE:[id number] DOB: Sep 19, 1970 DOA: 01/05/2022  PCP: Pcp, No  Patient coming from: ***  I have personally briefly reviewed patient's old medical records in [place]  Chief Complaint: ***  HPI: Russell Baldwin is a 52 y.o. male with medical history significant of    ED Course: ***  Review of Systems: As per HPI otherwise 10 point review of systems negative.   Past Medical History:  Diagnosis Date  . Coronary artery disease    a. 2012: inferior STEMI s/p DES to RCA,  b. 2016 inferior STEMI for late ISR s/p DES to RCA, c. LHC 09/2020 severe single vessel CAD with 100% stenosis of mid RCA with bridging and collaterals (felt to be CTO - treated medically)  . HLD (hyperlipidemia)   . HTN (hypertension)   . Ischemic cardiomyopathy    a. LV gram EF 40% (03/2014); normal EF by echo  . Prediabetes     Past Surgical History:  Procedure Laterality Date  . CARDIAC CATHETERIZATION    . LEFT HEART CATH N/A 03/21/2014   Procedure: LEFT HEART CATH;  Surgeon: Lorretta Harp, MD;  Location: [facility] CATH LAB;  Service: Cardiovascular;  Laterality: N/A;  . LEFT HEART CATH AND CORONARY ANGIOGRAPHY N/A 09/20/2020   Procedure: LEFT HEART CATH AND CORONARY ANGIOGRAPHY;  Surgeon: Leonie Man, MD;  Location: [facility] CV LAB;  Service: Cardiovascular;  Laterality: N/A;     reports that he has never smoked. He has never used smokeless tobacco. He reports current alcohol use. He reports that he does not use drugs.  No Known Allergies  Family History  Problem Relation Age of Onset  . Heart disease Mother   . Diabetes Mother   . Diabetes Father   . Heart disease Father    *** Prior to Admission medications   Medication Sig Start Date End Date Taking? Authorizing Provider  amLODipine (NORVASC) 5 MG tablet Take 1 tablet (5 mg total) by mouth daily. Patient not taking: Reported on 01/05/2022 10/07/20   Darreld Mclean, PA-C  aspirin 81 MG chewable  tablet Chew 1 tablet (81 mg total) by mouth daily. Patient not taking: Reported on 01/05/2022 03/23/14   Eileen Stanford, PA-C  atorvastatin (LIPITOR) 80 MG tablet Take 1 tablet (80 mg total) by mouth daily at 6 PM. Patient not taking: Reported on 01/05/2022 09/20/20   Samuella Cota, MD  carvedilol (COREG) 25 MG tablet Take 1 tablet (25 mg total) by mouth 2 (two) times daily with a meal. Patient not taking: Reported on 01/05/2022 09/20/20   Samuella Cota, MD  clopidogrel (PLAVIX) 75 MG tablet Take 1 tablet (75 mg total) by mouth daily. Patient not taking: Reported on 01/05/2022 09/21/20   Samuella Cota, MD  dapagliflozin propanediol (FARXIGA) 10 MG TABS tablet Take 1 tablet (10 mg total) by mouth daily. Patient not taking: Reported on 01/05/2022 09/21/20   Samuella Cota, MD  isosorbide mononitrate (IMDUR) 30 MG 24 hr tablet Take 1 tablet (30 mg total) by mouth daily. Patient not taking: Reported on 01/05/2022 09/20/20   Samuella Cota, MD  lisinopril (ZESTRIL) 40 MG tablet Take 1 tablet (40 mg total) by mouth daily. Patient not taking: Reported on 01/05/2022 09/21/20   Samuella Cota, MD  nitroGLYCERIN (NITROSTAT) 0.4 MG SL tablet Place 1 tablet (0.4 mg total) under the tongue every 5 (five) minutes as needed for chest pain. 09/20/20   Samuella Cota,  MD  spironolactone (ALDACTONE) 25 MG tablet Take 1 tablet (25 mg total) by mouth daily. Patient not taking: Reported on 01/05/2022 09/21/20   Samuella Cota, MD    Physical Exam: Vitals:   01/05/22 2052 01/05/22 2200 01/05/22 2230 01/05/22 2300  BP: (!) 174/131 (!) 163/119 (!) 165/118 (!) 164/119  Pulse: (!) 107 (!) 107 100 96  Resp: 18 (!) 26 20 (!) 23  Temp: 99 F (37.2 C)     TempSrc: Oral     SpO2: 97% 97% 96% 97%    Constitutional: NAD, calm, comfortable Vitals:   01/05/22 2052 01/05/22 2200 01/05/22 2230 01/05/22 2300  BP: (!) 174/131 (!) 163/119 (!) 165/118 (!) 164/119  Pulse: (!) 107 (!) 107 100 96  Resp: 18  (!) 26 20 (!) 23  Temp: 99 F (37.2 C)     TempSrc: Oral     SpO2: 97% 97% 96% 97%   Eyes: PERRL, lids and conjunctivae normal ENMT: Mucous membranes are moist. Posterior pharynx clear of any exudate or lesions.Normal dentition.  Neck: normal, supple, no masses, no thyromegaly Respiratory: clear to auscultation bilaterally, no wheezing, no crackles. Normal respiratory effort. No accessory muscle use.  Cardiovascular: Regular rate and rhythm, no murmurs / rubs / gallops. No extremity edema. 2+ pedal pulses. No carotid bruits.  Abdomen: no tenderness, no masses palpated. No hepatosplenomegaly. Bowel sounds positive.  Musculoskeletal: no clubbing / cyanosis. No joint deformity upper and lower extremities. Good ROM, no contractures. Normal muscle tone.  Skin: no rashes, lesions, ulcers. No induration Neurologic: CN 2-12 grossly intact. Sensation intact, DTR normal. Strength 5/5 in all 4.  Psychiatric: Normal judgment and insight. Alert and oriented x 3. Normal mood.    Labs on Admission: I have personally reviewed following labs and imaging studies  CBC: Recent Labs  Lab 01/05/22 2116  WBC 8.4  NEUTROABS 7.1  HGB 10.6*  HCT 35.5*  MCV 70.2*  PLT 381   Basic Metabolic Panel: Recent Labs  Lab 01/05/22 2116  NA 135  K 3.7  CL 102  CO2 23  GLUCOSE 112*  BUN 13  CREATININE 1.29*  CALCIUM 8.6*   GFR: CrCl cannot be calculated (Unknown ideal weight.). Liver Function Tests: Recent Labs  Lab 01/05/22 2116  AST 60*  ALT 88*  ALKPHOS 54  BILITOT 1.6*  PROT 6.2*  ALBUMIN 3.2*   No results for input(s): "LIPASE", "AMYLASE" in the last 168 hours. No results for input(s): "AMMONIA" in the last 168 hours. Coagulation Profile: No results for input(s): "INR", "PROTIME" in the last 168 hours. Cardiac Enzymes: No results for input(s): "CKTOTAL", "CKMB", "CKMBINDEX", "TROPONINI" in the last 168 hours. BNP (last 3 results) No results for input(s): "PROBNP" in the last 8760  hours. HbA1C: No results for input(s): "HGBA1C" in the last 72 hours. CBG: No results for input(s): "GLUCAP" in the last 168 hours. Lipid Profile: No results for input(s): "CHOL", "HDL", "LDLCALC", "TRIG", "CHOLHDL", "LDLDIRECT" in the last 72 hours. Thyroid Function Tests: No results for input(s): "TSH", "T4TOTAL", "FREET4", "T3FREE", "THYROIDAB" in the last 72 hours. Anemia Panel: No results for input(s): "VITAMINB12", "FOLATE", "FERRITIN", "TIBC", "IRON", "RETICCTPCT" in the last 72 hours. Urine analysis:    Component Value Date/Time   COLORURINE YELLOW 05/23/2010 1009   [place] 05/23/2010 1009   LABSPEC >=1.030 08/27/2012 1820   PHURINE 6.0 08/27/2012 1820   GLUCOSEU NEGATIVE 08/27/2012 1820   HGBUR NEGATIVE 08/27/2012 1820   BILIRUBINUR NEGATIVE 08/27/2012 1820   KETONESUR NEGATIVE 08/27/2012 1820  PROTEINUR 30 (A) 08/27/2012 1820   UROBILINOGEN 0.2 08/27/2012 1820   NITRITE NEGATIVE 08/27/2012 1820   LEUKOCYTESUR NEGATIVE 08/27/2012 1820    Radiological Exams on Admission: DG Chest 2 View  Result Date: 01/05/2022 CLINICAL DATA:  Shortness of breath EXAM: CHEST - 2 VIEW COMPARISON:  09/19/2020 FINDINGS: Transverse diameter of heart is increased. Central pulmonary vessels are more prominent. Increased interstitial markings are seen in parahilar regions and lower lung fields. Linear patchy densities are seen in right lower lung field. There is blunting of right lateral CP angle. There is no pneumothorax. IMPRESSION: Cardiomegaly. Central pulmonary vessels are more prominent suggesting CHF. Increased markings are seen in right lower lung fields suggesting atelectasis/pneumonia. Part of this finding may be due to asymmetric pulmonary edema. Small right pleural effusion is seen. Electronically Signed   By: Ernie Avena M.D.   On: 01/05/2022 21:27    EKG: Independently reviewed. ***  Assessment/Plan Active Problems:   * No active hospital problems. *    ***  DVT prophylaxis: *** (Lovenox/Heparin/SCD's/anticoagulated/None (if comfort care) Code Status: *** (Full/Partial (specify details) Family Communication: *** (Specify name, relationship. Do not write "discussed with patient". Specify tel # if discussed over the phone) Disposition Plan: *** (specify when and where you expect patient to be discharged) Consults called: *** (with names) Admission status: *** (inpatient / obs / tele / medical floor / SDU)   Lurline Del MD [facility] Pager [phone]  If 7PM-7AM, please contact night-coverage [email or web address] Password [email or web address]  01/05/2022, 11:59 PM

## 2022-01-06 ENCOUNTER — Inpatient Hospital Stay (HOSPITAL_COMMUNITY): Payer: No Typology Code available for payment source

## 2022-01-06 ENCOUNTER — Encounter (HOSPITAL_COMMUNITY): Payer: Self-pay | Admitting: Internal Medicine

## 2022-01-06 DIAGNOSIS — I2510 Atherosclerotic heart disease of native coronary artery without angina pectoris: Secondary | ICD-10-CM | POA: Diagnosis present

## 2022-01-06 DIAGNOSIS — U071 COVID-19: Secondary | ICD-10-CM | POA: Diagnosis present

## 2022-01-06 DIAGNOSIS — Z7982 Long term (current) use of aspirin: Secondary | ICD-10-CM | POA: Diagnosis not present

## 2022-01-06 DIAGNOSIS — R072 Precordial pain: Secondary | ICD-10-CM | POA: Diagnosis not present

## 2022-01-06 DIAGNOSIS — I5020 Unspecified systolic (congestive) heart failure: Secondary | ICD-10-CM | POA: Diagnosis not present

## 2022-01-06 DIAGNOSIS — I2609 Other pulmonary embolism with acute cor pulmonale: Secondary | ICD-10-CM | POA: Diagnosis not present

## 2022-01-06 DIAGNOSIS — Z7902 Long term (current) use of antithrombotics/antiplatelets: Secondary | ICD-10-CM | POA: Diagnosis not present

## 2022-01-06 DIAGNOSIS — I2699 Other pulmonary embolism without acute cor pulmonale: Secondary | ICD-10-CM

## 2022-01-06 DIAGNOSIS — E785 Hyperlipidemia, unspecified: Secondary | ICD-10-CM | POA: Diagnosis present

## 2022-01-06 DIAGNOSIS — I5082 Biventricular heart failure: Secondary | ICD-10-CM | POA: Diagnosis present

## 2022-01-06 DIAGNOSIS — I5023 Acute on chronic systolic (congestive) heart failure: Secondary | ICD-10-CM

## 2022-01-06 DIAGNOSIS — I10 Essential (primary) hypertension: Secondary | ICD-10-CM | POA: Diagnosis not present

## 2022-01-06 DIAGNOSIS — I509 Heart failure, unspecified: Secondary | ICD-10-CM | POA: Insufficient documentation

## 2022-01-06 DIAGNOSIS — I252 Old myocardial infarction: Secondary | ICD-10-CM | POA: Diagnosis not present

## 2022-01-06 DIAGNOSIS — I161 Hypertensive emergency: Secondary | ICD-10-CM | POA: Diagnosis present

## 2022-01-06 DIAGNOSIS — I255 Ischemic cardiomyopathy: Secondary | ICD-10-CM | POA: Diagnosis present

## 2022-01-06 DIAGNOSIS — I25119 Atherosclerotic heart disease of native coronary artery with unspecified angina pectoris: Secondary | ICD-10-CM | POA: Diagnosis not present

## 2022-01-06 DIAGNOSIS — Z91148 Patient's other noncompliance with medication regimen for other reason: Secondary | ICD-10-CM | POA: Diagnosis not present

## 2022-01-06 DIAGNOSIS — R7989 Other specified abnormal findings of blood chemistry: Secondary | ICD-10-CM

## 2022-01-06 DIAGNOSIS — I272 Pulmonary hypertension, unspecified: Secondary | ICD-10-CM | POA: Diagnosis present

## 2022-01-06 DIAGNOSIS — Z8249 Family history of ischemic heart disease and other diseases of the circulatory system: Secondary | ICD-10-CM | POA: Diagnosis not present

## 2022-01-06 DIAGNOSIS — I2694 Multiple subsegmental pulmonary emboli without acute cor pulmonale: Secondary | ICD-10-CM | POA: Diagnosis present

## 2022-01-06 DIAGNOSIS — Z955 Presence of coronary angioplasty implant and graft: Secondary | ICD-10-CM | POA: Diagnosis not present

## 2022-01-06 DIAGNOSIS — N179 Acute kidney failure, unspecified: Secondary | ICD-10-CM | POA: Diagnosis present

## 2022-01-06 DIAGNOSIS — I110 Hypertensive heart disease with heart failure: Secondary | ICD-10-CM | POA: Diagnosis present

## 2022-01-06 DIAGNOSIS — Z79899 Other long term (current) drug therapy: Secondary | ICD-10-CM | POA: Diagnosis not present

## 2022-01-06 LAB — COMPREHENSIVE METABOLIC PANEL
ALT: 86 U/L — ABNORMAL HIGH (ref 0–44)
AST: 54 U/L — ABNORMAL HIGH (ref 15–41)
Albumin: 3.2 g/dL — ABNORMAL LOW (ref 3.5–5.0)
Alkaline Phosphatase: 56 U/L (ref 38–126)
Anion gap: 9 (ref 5–15)
BUN: 13 mg/dL (ref 6–20)
CO2: 27 mmol/L (ref 22–32)
Calcium: 8.6 mg/dL — ABNORMAL LOW (ref 8.9–10.3)
Chloride: 99 mmol/L (ref 98–111)
Creatinine, Ser: 1.36 mg/dL — ABNORMAL HIGH (ref 0.61–1.24)
GFR, Estimated: >60 mL/min (ref >60)
Glucose, Bld: 141 mg/dL — ABNORMAL HIGH (ref 70–99)
Potassium: 3.6 mmol/L (ref 3.5–5.1)
Sodium: 135 mmol/L (ref 135–145)
Total Bilirubin: 1.6 mg/dL — ABNORMAL HIGH (ref 0.3–1.2)
Total Protein: 6.3 g/dL — ABNORMAL LOW (ref 6.5–8.1)

## 2022-01-06 LAB — ECHOCARDIOGRAM COMPLETE
AR max vel: 1.87 cm2
AV Area VTI: 1.35 cm2
AV Area mean vel: 1.67 cm2
AV Mean grad: 3.7 mmHg
AV Peak grad: 6.9 mmHg
Ao pk vel: 1.31 m/s
Calc EF: 26.9 %
Height: 70 in
MV M vel: 2.68 m/s
MV Peak grad: 28.6 mmHg
S' Lateral: 4.9 cm
Single Plane A2C EF: 22.3 %
Single Plane A4C EF: 32.4 %
Weight: 2880 oz

## 2022-01-06 LAB — D-DIMER, QUANTITATIVE: D-Dimer, Quant: 4.68 ug/mL-FEU — ABNORMAL HIGH (ref 0.00–0.50)

## 2022-01-06 LAB — CBC
HCT: 34.8 % — ABNORMAL LOW (ref 39.0–52.0)
HCT: 34.9 % — ABNORMAL LOW (ref 39.0–52.0)
Hemoglobin: 10.5 g/dL — ABNORMAL LOW (ref 13.0–17.0)
Hemoglobin: 10.5 g/dL — ABNORMAL LOW (ref 13.0–17.0)
MCH: 21.2 pg — ABNORMAL LOW (ref 26.0–34.0)
MCH: 21 pg — ABNORMAL LOW (ref 26.0–34.0)
MCHC: 30.2 g/dL (ref 30.0–36.0)
MCHC: 30.1 g/dL (ref 30.0–36.0)
MCV: 70.3 fL — ABNORMAL LOW (ref 80.0–100.0)
MCV: 69.8 fL — ABNORMAL LOW (ref 80.0–100.0)
Platelets: 286 10*3/uL (ref 150–400)
Platelets: 300 10*3/uL (ref 150–400)
RBC: 4.95 MIL/uL (ref 4.22–5.81)
RBC: 5 MIL/uL (ref 4.22–5.81)
RDW: 18.2 % — ABNORMAL HIGH (ref 11.5–15.5)
RDW: 18.1 % — ABNORMAL HIGH (ref 11.5–15.5)
WBC: 9.5 10*3/uL (ref 4.0–10.5)
WBC: 10.7 10*3/uL — ABNORMAL HIGH (ref 4.0–10.5)
nRBC: 0 % (ref 0.0–0.2)
nRBC: 0 % (ref 0.0–0.2)

## 2022-01-06 LAB — HIV ANTIBODY (ROUTINE TESTING W REFLEX): HIV Screen 4th Generation wRfx: NONREACTIVE

## 2022-01-06 LAB — FERRITIN: Ferritin: 30 ng/mL (ref 24–336)

## 2022-01-06 LAB — CREATININE, SERUM
Creatinine, Ser: 1.3 mg/dL — ABNORMAL HIGH (ref 0.61–1.24)
GFR, Estimated: >60 mL/min (ref >60)

## 2022-01-06 LAB — PROCALCITONIN: Procalcitonin: 0.45 ng/mL

## 2022-01-06 LAB — C-REACTIVE PROTEIN: CRP: 5 mg/dL — ABNORMAL HIGH (ref <1.0)

## 2022-01-06 LAB — PROTIME-INR
INR: 1.3 — ABNORMAL HIGH (ref 0.8–1.2)
Prothrombin Time: 16.3 seconds — ABNORMAL HIGH (ref 11.4–15.2)

## 2022-01-06 LAB — TSH: TSH: 1.662 u[IU]/mL (ref 0.350–4.500)

## 2022-01-06 LAB — HEPARIN LEVEL (UNFRACTIONATED)
Heparin Unfractionated: 0.54 IU/mL (ref 0.30–0.70)
Heparin Unfractionated: 0.58 IU/mL (ref 0.30–0.70)

## 2022-01-06 LAB — LACTATE DEHYDROGENASE: LDH: 304 U/L — ABNORMAL HIGH (ref 98–192)

## 2022-01-06 LAB — TROPONIN I (HIGH SENSITIVITY): Troponin I (High Sensitivity): 164 ng/L (ref <18)

## 2022-01-06 MED ORDER — GUAIFENESIN-DM 100-10 MG/5ML PO SYRP
10.0000 mL | ORAL_SOLUTION | ORAL | Status: DC | PRN
  Administered 2022-01-08: 10 mL via ORAL
  Filled 2022-01-06: qty 10

## 2022-01-06 MED ORDER — NITROGLYCERIN 0.4 MG SL SUBL: 0.4000 mg | SUBLINGUAL_TABLET | SUBLINGUAL | Status: DC | PRN

## 2022-01-06 MED ORDER — CARVEDILOL 12.5 MG PO TABS: 25.0000 mg | ORAL_TABLET | Freq: Two times a day (BID) | ORAL | Status: DC

## 2022-01-06 MED ORDER — CARVEDILOL 12.5 MG PO TABS
12.5000 mg | ORAL_TABLET | Freq: Two times a day (BID) | ORAL | Status: DC
  Administered 2022-01-06 – 2022-01-10 (×10): 12.5 mg via ORAL
  Filled 2022-01-06 (×10): qty 1

## 2022-01-06 MED ORDER — HEPARIN (PORCINE) 25000 UT/250ML-% IV SOLN
1850.0000 [IU]/h | INTRAVENOUS | Status: DC
  Administered 2022-01-06 – 2022-01-07 (×3): 1450 [IU]/h via INTRAVENOUS
  Filled 2022-01-06 (×4): qty 250

## 2022-01-06 MED ORDER — FUROSEMIDE 10 MG/ML IJ SOLN
40.0000 mg | Freq: Two times a day (BID) | INTRAMUSCULAR | Status: DC
  Administered 2022-01-06 – 2022-01-07 (×3): 40 mg via INTRAVENOUS
  Filled 2022-01-06 (×3): qty 4

## 2022-01-06 MED ORDER — ASPIRIN 81 MG PO CHEW
81.0000 mg | CHEWABLE_TABLET | Freq: Every day | ORAL | Status: DC
  Administered 2022-01-06 – 2022-01-10 (×5): 81 mg via ORAL
  Filled 2022-01-06 (×5): qty 1

## 2022-01-06 MED ORDER — AMLODIPINE BESYLATE 5 MG PO TABS
5.0000 mg | ORAL_TABLET | Freq: Every day | ORAL | Status: DC
  Administered 2022-01-06: 5 mg via ORAL
  Filled 2022-01-06: qty 1

## 2022-01-06 MED ORDER — PERFLUTREN LIPID MICROSPHERE
1.0000 mL | INTRAVENOUS | Status: AC | PRN
  Administered 2022-01-06: 2 mL via INTRAVENOUS

## 2022-01-06 MED ORDER — ACETAMINOPHEN 325 MG PO TABS
650.0000 mg | ORAL_TABLET | Freq: Four times a day (QID) | ORAL | Status: DC | PRN
  Administered 2022-01-08 – 2022-01-10 (×3): 650 mg via ORAL
  Filled 2022-01-06 (×3): qty 2

## 2022-01-06 MED ORDER — LISINOPRIL 20 MG PO TABS: 40.0000 mg | ORAL_TABLET | Freq: Every day | ORAL | Status: DC

## 2022-01-06 MED ORDER — GUAIFENESIN ER 600 MG PO TB12: 600.0000 mg | ORAL_TABLET | Freq: Two times a day (BID) | ORAL | Status: DC

## 2022-01-06 MED ORDER — FENTANYL CITRATE PF 50 MCG/ML IJ SOSY: 12.5000 ug | PREFILLED_SYRINGE | INTRAMUSCULAR | Status: DC | PRN

## 2022-01-06 MED ORDER — ATORVASTATIN CALCIUM 80 MG PO TABS
80.0000 mg | ORAL_TABLET | Freq: Every day | ORAL | Status: DC
  Administered 2022-01-06 – 2022-01-09 (×4): 80 mg via ORAL
  Filled 2022-01-06 (×5): qty 1

## 2022-01-06 MED ORDER — HEPARIN BOLUS VIA INFUSION
5000.0000 [IU] | Freq: Once | INTRAVENOUS | Status: AC
  Administered 2022-01-06: 5000 [IU] via INTRAVENOUS
  Filled 2022-01-06: qty 5000

## 2022-01-06 MED ORDER — ISOSORBIDE MONONITRATE ER 30 MG PO TB24
30.0000 mg | ORAL_TABLET | Freq: Every day | ORAL | Status: DC
  Administered 2022-01-06 – 2022-01-08 (×3): 30 mg via ORAL
  Filled 2022-01-06 (×3): qty 1

## 2022-01-06 MED ORDER — FUROSEMIDE 10 MG/ML IJ SOLN: 40.0000 mg | Freq: Two times a day (BID) | INTRAMUSCULAR | Status: DC

## 2022-01-06 MED ORDER — NIRMATRELVIR/RITONAVIR (PAXLOVID)TABLET
3.0000 | ORAL_TABLET | Freq: Two times a day (BID) | ORAL | Status: DC
  Administered 2022-01-06 – 2022-01-08 (×6): 3 via ORAL
  Filled 2022-01-06 (×2): qty 30

## 2022-01-06 MED ORDER — ONDANSETRON HCL 4 MG/2ML IJ SOLN: 4.0000 mg | Freq: Four times a day (QID) | INTRAMUSCULAR | Status: DC | PRN

## 2022-01-06 MED ORDER — DAPAGLIFLOZIN PROPANEDIOL 10 MG PO TABS
10.0000 mg | ORAL_TABLET | Freq: Every day | ORAL | Status: DC
  Administered 2022-01-06 – 2022-01-08 (×3): 10 mg via ORAL
  Filled 2022-01-06 (×3): qty 1

## 2022-01-06 MED ORDER — HEPARIN SODIUM (PORCINE) 5000 UNIT/ML IJ SOLN
5000.0000 [IU] | Freq: Three times a day (TID) | INTRAMUSCULAR | Status: DC
  Administered 2022-01-06: 5000 [IU] via SUBCUTANEOUS
  Filled 2022-01-06: qty 1

## 2022-01-06 MED ORDER — HYDRALAZINE HCL 20 MG/ML IJ SOLN
20.0000 mg | INTRAMUSCULAR | Status: DC | PRN
  Administered 2022-01-06: 20 mg via INTRAVENOUS
  Filled 2022-01-06: qty 1

## 2022-01-06 MED ORDER — ACETAMINOPHEN 650 MG RE SUPP: 650.0000 mg | Freq: Four times a day (QID) | RECTAL | Status: DC | PRN

## 2022-01-06 MED ORDER — IOHEXOL 350 MG/ML SOLN
75.0000 mL | Freq: Once | INTRAVENOUS | Status: AC | PRN
  Administered 2022-01-06: 75 mL via INTRAVENOUS

## 2022-01-06 MED ORDER — SPIRONOLACTONE 25 MG PO TABS
25.0000 mg | ORAL_TABLET | Freq: Every day | ORAL | Status: DC
  Administered 2022-01-06 – 2022-01-10 (×5): 25 mg via ORAL
  Filled 2022-01-06 (×5): qty 1

## 2022-01-06 MED ORDER — ORAL CARE MOUTH RINSE: 15.0000 mL | OROMUCOSAL | Status: DC | PRN

## 2022-01-06 MED ORDER — CLOPIDOGREL BISULFATE 75 MG PO TABS
75.0000 mg | ORAL_TABLET | Freq: Every day | ORAL | Status: DC
  Administered 2022-01-06 – 2022-01-07 (×2): 75 mg via ORAL
  Filled 2022-01-06 (×2): qty 1

## 2022-01-06 MED ORDER — HYDROCOD POLI-CHLORPHE POLI ER 10-8 MG/5ML PO SUER
5.0000 mL | Freq: Two times a day (BID) | ORAL | Status: DC | PRN
  Administered 2022-01-08 – 2022-01-10 (×2): 5 mL via ORAL
  Filled 2022-01-06 (×2): qty 5

## 2022-01-06 MED ORDER — ONDANSETRON HCL 4 MG PO TABS: 4.0000 mg | ORAL_TABLET | Freq: Four times a day (QID) | ORAL | Status: DC | PRN

## 2022-01-06 MED ORDER — ALBUTEROL SULFATE (2.5 MG/3ML) 0.083% IN NEBU: 2.5000 mg | INHALATION_SOLUTION | RESPIRATORY_TRACT | Status: DC | PRN

## 2022-01-06 NOTE — Progress Notes (Signed)
Lower extremity venous bilateral study completed.   Please see CV Proc for preliminary results.   Thomasina Housley, RDMS, RVT  

## 2022-01-06 NOTE — ED Notes (Signed)
ED TO INPATIENT HANDOFF REPORT  ED Nurse Name and Phone #: [phone]  S Name/Age/Gender Russell Baldwin 52 y.o. male Room/Bed: 010C/010C  Code Status   Code Status: Full Code  Home/SNF/Other Home Patient oriented to: self, place, time, and situation Is this baseline? Yes   Triage Complete: Triage complete  Chief Complaint CHF exacerbation (HCC) [I50.9] CHF (congestive heart failure) (HCC) [I50.9]  Triage Note Patient reports pain across his chest with SOB and legs swelling onset this evening , history of CAD / Coronary stent .    Allergies No Known Allergies  Level of Care/Admitting Diagnosis ED Disposition     ED Disposition  Admit   Condition  --   Comment  H[facility] [[id number]]  Level of Care: Progressive [102]  Admit to Progressive based on following criteria: CARDIOVASCULAR & THORACIC of moderate stability with acute coronary syndrome symptoms/low risk myocardial infarction/hypertensive urgency/arrhythmias/heart failure potentially compromising stability and stable post cardiovascular intervention patients.  May admit patient to [facility] or [facility] if equivalent level of care is available:: Yes  Covid Evaluation: Confirmed COVID Positive  Diagnosis: CHF (congestive heart failure) [facility]) [[id number]]  Admitting Physician: Lurline Del [[id number]]  Attending Physician: Lurline Del [[id number]]  Certification:: I certify this patient will need inpatient services for at least 2 midnights  Estimated Length of Stay: 3          B Medical/Surgery History Past Medical History:  Diagnosis Date   Coronary artery disease    a. 2012: inferior STEMI s/p DES to RCA,  b. 2016 inferior STEMI for late ISR s/p DES to RCA, c. LHC 09/2020 severe single vessel CAD with 100% stenosis of mid RCA with bridging and collaterals (felt to be CTO - treated medically)   HLD (hyperlipidemia)    HTN (hypertension)    Ischemic cardiomyopathy    a.  LV gram EF 40% (03/2014); normal EF by echo   Prediabetes    Past Surgical History:  Procedure Laterality Date   CARDIAC CATHETERIZATION     LEFT HEART CATH N/A 03/21/2014   Procedure: LEFT HEART CATH;  Surgeon: Runell Gess, MD;  Location: [facility] CATH LAB;  Service: Cardiovascular;  Laterality: N/A;   LEFT HEART CATH AND CORONARY ANGIOGRAPHY N/A 09/20/2020   Procedure: LEFT HEART CATH AND CORONARY ANGIOGRAPHY;  Surgeon: Marykay Lex, MD;  Location: [facility] INVASIVE CV LAB;  Service: Cardiovascular;  Laterality: N/A;     A IV Location/Drains/Wounds Patient Lines/Drains/Airways Status     Active Line/Drains/Airways     Name Placement date Placement time Site Days   Peripheral IV 01/05/22 20 G Anterior;Right Forearm 01/05/22  2244  Forearm  1   Peripheral IV 01/05/22 20 G Left Antecubital 01/05/22  2352  Antecubital  1            Intake/Output Last 24 hours  Intake/Output Summary (Last 24 hours) at 01/06/2022 1554 Last data filed at 01/06/2022 1515 Gross per 24 hour  Intake 1830 ml  Output 1200 ml  Net 630 ml    Labs/Imaging Results for orders placed or performed during the hospital encounter of 01/05/22 (from the past 48 hour(s))  Resp panel by RT-PCR (RSV, Flu A&B, Covid) Anterior Nasal Swab     Status: Abnormal   Collection Time: 01/05/22  9:11 PM   Specimen: Anterior Nasal Swab  Result Value Ref Range   SARS Coronavirus 2 by RT PCR POSITIVE (A) NEGATIVE    Comment: (NOTE) SARS-CoV-2 target  nucleic acids are DETECTED.  The SARS-CoV-2 RNA is generally detectable in upper respiratory specimens during the acute phase of infection. Positive results are indicative of the presence of the identified virus, but do not rule out bacterial infection or co-infection with other pathogens not detected by the test. Clinical correlation with patient history and other diagnostic information is necessary to determine patient infection status. The expected result is Negative.  Fact  Sheet for Patients: BloggerCourse.com  Fact Sheet for Healthcare Providers: SeriousBroker.it  This test is not yet approved or cleared by the [place] FDA and  has been authorized for detection and/or diagnosis of SARS-CoV-2 by FDA under an Emergency Use Authorization (EUA).  This EUA will remain in effect (meaning this test can be used) for the duration of  the COVID-19 declaration under Section 564(b)(1) of the A ct, 21 U.S.C. section 360bbb-3(b)(1), unless the authorization is terminated or revoked sooner.     Influenza A by PCR NEGATIVE NEGATIVE   Influenza B by PCR NEGATIVE NEGATIVE    Comment: (NOTE) The Xpert Xpress SARS-CoV-2/FLU/RSV plus assay is intended as an aid in the diagnosis of influenza from Nasopharyngeal swab specimens and should not be used as a sole basis for treatment. Nasal washings and aspirates are unacceptable for Xpert Xpress SARS-CoV-2/FLU/RSV testing.  Fact Sheet for Patients: BloggerCourse.com  Fact Sheet for Healthcare Providers: SeriousBroker.it  This test is not yet approved or cleared by the [place] FDA and has been authorized for detection and/or diagnosis of SARS-CoV-2 by FDA under an Emergency Use Authorization (EUA). This EUA will remain in effect (meaning this test can be used) for the duration of the COVID-19 declaration under Section 564(b)(1) of the Act, 21 U.S.C. section 360bbb-3(b)(1), unless the authorization is terminated or revoked.     Resp Syncytial Virus by PCR NEGATIVE NEGATIVE    Comment: (NOTE) Fact Sheet for Patients: BloggerCourse.com  Fact Sheet for Healthcare Providers: SeriousBroker.it  This test is not yet approved or cleared by the [place] FDA and has been authorized for detection and/or diagnosis of SARS-CoV-2 by FDA under an Emergency Use  Authorization (EUA). This EUA will remain in effect (meaning this test can be used) for the duration of the COVID-19 declaration under Section 564(b)(1) of the Act, 21 U.S.C. section 360bbb-3(b)(1), unless the authorization is terminated or revoked.  Performed at [facility], [address] [place], [place] [address]   Brain natriuretic peptide     Status: Abnormal   Collection Time: 01/05/22  9:16 PM  Result Value Ref Range   B Natriuretic Peptide 2,538.5 (H) 0.0 - 100.0 pg/mL    Comment: Performed at [facility], [address], [place], [place] [address]  Troponin I (High Sensitivity)     Status: Abnormal   Collection Time: 01/05/22  9:16 PM  Result Value Ref Range   Troponin I (High Sensitivity) 163 (HH) <18 ng/L    Comment: CRITICAL RESULT CALLED TO, READ BACK BY AND VERIFIED WITH Keonda Dow JAMES RN 01/05/22 2247 M KOROLESKI (NOTE) Elevated high sensitivity troponin I (hsTnI) values and significant  changes across serial measurements may suggest ACS but many other  chronic and acute conditions are known to elevate hsTnI results.  Refer to the "Links" section for chest pain algorithms and additional  guidance. Performed at [facility], [address] [place], [place] [address]   Comprehensive metabolic panel     Status: Abnormal   Collection Time: 01/05/22  9:16 PM  Result Value Ref Range   Sodium 135 135 - 145 mmol/L   Potassium 3.7 3.5 - 5.1 mmol/L   Chloride 102 98 - 111 mmol/L   CO2 23 22 - 32 mmol/L   Glucose, Bld 112 (H) 70 - 99 mg/dL    Comment: Glucose reference range applies only to samples taken after fasting for at least 8 hours.   BUN 13 6 - 20 mg/dL   Creatinine, Ser 1.61 (H) 0.61 - 1.24 mg/dL   Calcium 8.6 (L) 8.9 - 10.3 mg/dL   Total Protein 6.2 (L) 6.5 - 8.1 g/dL   Albumin 3.2 (L) 3.5 - 5.0 g/dL   AST 60 (H) 15 - 41 U/L   ALT 88 (H) 0 - 44 U/L   Alkaline Phosphatase 54 38 - 126 U/L   Total Bilirubin 1.6 (H) 0.3 - 1.2 mg/dL   GFR,  Estimated >09 >60 mL/min    Comment: (NOTE) Calculated using the CKD-EPI Creatinine Equation (2021)    Anion gap 10 5 - 15    Comment: Performed at [facility], [address] [place], [place] [address]  CBC with Differential     Status: Abnormal   Collection Time: 01/05/22  9:16 PM  Result Value Ref Range   WBC 8.4 4.0 - 10.5 K/uL   RBC 5.06 4.22 - 5.81 MIL/uL   Hemoglobin 10.6 (L) 13.0 - 17.0 g/dL   HCT 81.1 (L) 91.4 - 78.2 %   MCV 70.2 (L) 80.0 - 100.0 fL   MCH 20.9 (L) 26.0 - 34.0 pg   MCHC 29.9 (L) 30.0 - 36.0 g/dL   RDW 95.6 (H) 21.3 - 08.6 %   Platelets 318 150 - 400 K/uL   nRBC 0.0 0.0 - 0.2 %   Neutrophils Relative % 84 %   Neutro Abs 7.1 1.7 - 7.7 K/uL   Lymphocytes Relative 6 %   Lymphs Abs 0.5 (L) 0.7 - 4.0 K/uL   Monocytes Relative 9 %   Monocytes Absolute 0.7 0.1 - 1.0 K/uL   Eosinophils Relative 0 %   Eosinophils Absolute 0.0 0.0 - 0.5 K/uL   Basophils Relative 0 %   Basophils Absolute 0.0 0.0 - 0.1 K/uL   Immature Granulocytes 1 %   Abs Immature Granulocytes 0.04 0.00 - 0.07 K/uL    Comment: Performed at [facility], [address] [place], [place] [address]  Troponin I (High Sensitivity)     Status: Abnormal   Collection Time: 01/05/22 11:52 PM  Result Value Ref Range   Troponin I (High Sensitivity) 164 (HH) <18 ng/L    Comment: CRITICAL VALUE NOTED. VALUE IS CONSISTENT WITH PREVIOUSLY REPORTED/CALLED VALUE (NOTE) Elevated high sensitivity troponin I (hsTnI) values and significant  changes across serial measurements may suggest ACS but many other  chronic and acute conditions are known to elevate hsTnI results.  Refer to the "Links" section for chest pain algorithms and additional  guidance. Performed at [facility], [address] [place], [place] [address]   HIV Antibody (routine testing w rflx)     Status: None   Collection Time: 01/06/22  2:20 AM  Result Value Ref Range   HIV Screen 4th Generation wRfx Non Reactive Non  Reactive    Comment: Performed at [facility], [address], [place], [place] [address]  C-reactive protein     Status: Abnormal   Collection Time: 01/06/22  2:20 AM  Result Value Ref Range   CRP  5.0 (H) <1.0 mg/dL    Comment: Performed at [facility], [address], [place], [place] [address]  Ferritin     Status: None   Collection Time: 01/06/22  2:20 AM  Result Value Ref Range   Ferritin 30 24 - 336 ng/mL    Comment: Performed at [facility], [address] [place], [place] [address]  Procalcitonin - Baseline     Status: None   Collection Time: 01/06/22  2:20 AM  Result Value Ref Range   Procalcitonin 0.45 ng/mL    Comment:        Interpretation: PCT (Procalcitonin) <= 0.5 ng/mL: Systemic infection (sepsis) is not likely. Local bacterial infection is possible. (NOTE)       Sepsis PCT Algorithm           Lower Respiratory Tract                                      Infection PCT Algorithm    ----------------------------     ----------------------------         PCT < 0.25 ng/mL                PCT < 0.10 ng/mL          Strongly encourage             Strongly discourage   discontinuation of antibiotics    initiation of antibiotics    ----------------------------     -----------------------------       PCT 0.25 - 0.50 ng/mL            PCT 0.10 - 0.25 ng/mL               OR       >80% decrease in PCT            Discourage initiation of                                            antibiotics      Encourage discontinuation           of antibiotics    ----------------------------     -----------------------------         PCT >= 0.50 ng/mL              PCT 0.26 - 0.50 ng/mL               AND        <80% decrease in PCT             Encourage initiation of                                             antibiotics       Encourage continuation           of antibiotics    ----------------------------     -----------------------------        PCT >= 0.50 ng/mL                   PCT > 0.50 ng/mL  AND         increase in PCT                  Strongly encourage                                      initiation of antibiotics    Strongly encourage escalation           of antibiotics                                     -----------------------------                                           PCT <= 0.25 ng/mL                                                 OR                                        > 80% decrease in PCT                                      Discontinue / Do not initiate                                             antibiotics  Performed at [facility], [address] [place], [place] [address]   CBC     Status: Abnormal   Collection Time: 01/06/22  2:20 AM  Result Value Ref Range   WBC 9.5 4.0 - 10.5 K/uL   RBC 4.95 4.22 - 5.81 MIL/uL   Hemoglobin 10.5 (L) 13.0 - 17.0 g/dL   HCT 60.434.8 (L) 54.039.0 - 98.152.0 %   MCV 70.3 (L) 80.0 - 100.0 fL   MCH 21.2 (L) 26.0 - 34.0 pg   MCHC 30.2 30.0 - 36.0 g/dL   RDW 19.118.2 (H) 47.811.5 - 29.515.5 %   Platelets 286 150 - 400 K/uL   nRBC 0.0 0.0 - 0.2 %    Comment: Performed at [facility], [address] [place], [place] [address]  Creatinine, serum     Status: Abnormal   Collection Time: 01/06/22  2:20 AM  Result Value Ref Range   Creatinine, Ser 1.30 (H) 0.61 - 1.24 mg/dL   GFR, Estimated >86>60 >57>60 mL/min    Comment: (NOTE) Calculated using the CKD-EPI Creatinine Equation (2021) Performed at [facility], [address] [place], [place] [address]   TSH     Status: None   Collection Time: 01/06/22  2:20 AM  Result Value Ref Range   TSH 1.662 0.350 - 4.500 uIU/mL    Comment: Performed by a 3rd Generation assay with a functional sensitivity of <=0.01  uIU/mL. Performed at [facility], [address] [place], [place] [address]   D-dimer, quantitative     Status: Abnormal   Collection Time: 01/06/22  2:20 AM  Result Value Ref Range   D-Dimer, Quant 4.68 (H) 0.00 -  0.50 ug/mL-FEU    Comment: (NOTE) At the manufacturer cut-off value of 0.5 g/mL FEU, this assay has a negative predictive value of 95-100%.This assay is intended for use in conjunction with a clinical pretest probability (PTP) assessment model to exclude pulmonary embolism (PE) and deep venous thrombosis (DVT) in outpatients suspected of PE or DVT. Results should be correlated with clinical presentation. Performed at [facility], [address] [place], [place] [address]   Lactate dehydrogenase     Status: Abnormal   Collection Time: 01/06/22  2:20 AM  Result Value Ref Range   LDH 304 (H) 98 - 192 U/L    Comment: Performed at [facility], [address] [place], [place] [address]  CBC     Status: Abnormal   Collection Time: 01/06/22  3:26 AM  Result Value Ref Range   WBC 10.7 (H) 4.0 - 10.5 K/uL   RBC 5.00 4.22 - 5.81 MIL/uL   Hemoglobin 10.5 (L) 13.0 - 17.0 g/dL   HCT 91.434.9 (L) 78.239.0 - 95.652.0 %   MCV 69.8 (L) 80.0 - 100.0 fL   MCH 21.0 (L) 26.0 - 34.0 pg   MCHC 30.1 30.0 - 36.0 g/dL   RDW 21.318.1 (H) 08.611.5 - 57.815.5 %   Platelets 300 150 - 400 K/uL    Comment: REPEATED TO VERIFY   nRBC 0.0 0.0 - 0.2 %    Comment: Performed at [facility], [address] [place], [place] [address]  Comprehensive metabolic panel     Status: Abnormal   Collection Time: 01/06/22  3:26 AM  Result Value Ref Range   Sodium 135 135 - 145 mmol/L   Potassium 3.6 3.5 - 5.1 mmol/L   Chloride 99 98 - 111 mmol/L   CO2 27 22 - 32 mmol/L   Glucose, Bld 141 (H) 70 - 99 mg/dL    Comment: Glucose reference range applies only to samples taken after fasting for at least 8 hours.   BUN 13 6 - 20 mg/dL   Creatinine, Ser 9.521.36 (H) 0.61 - 1.24 mg/dL   Calcium 8.6 (L) 8.9 - 10.3 mg/dL   Total Protein 6.3 (L) 6.5 - 8.1 g/dL   Albumin 3.2 (L) 3.5 - 5.0 g/dL   AST 54 (H) 15 - 41 U/L   ALT 86 (H) 0 - 44 U/L   Alkaline Phosphatase 56 38 - 126 U/L   Total Bilirubin 1.6 (H) 0.3 - 1.2 mg/dL   GFR,  Estimated >84>60 >13>60 mL/min    Comment: (NOTE) Calculated using the CKD-EPI Creatinine Equation (2021)    Anion gap 9 5 - 15    Comment: Performed at [facility], [address] [place], [place] [address]  Protime-INR     Status: Abnormal   Collection Time: 01/06/22  3:26 AM  Result Value Ref Range   Prothrombin Time 16.3 (H) 11.4 - 15.2 seconds   INR 1.3 (H) 0.8 - 1.2    Comment: (NOTE) INR goal varies based on device and disease states. Performed at [facility], [address] [place], [place] [address]   Heparin level (unfractionated)     Status: None   Collection Time: 01/06/22 12:32 PM  Result Value Ref Range   Heparin Unfractionated 0.54 0.30 - 0.70 IU/mL    Comment: (NOTE) The clinical reportable range upper limit is being lowered to >1.10 to align with the FDA approved guidance for the current laboratory assay.  If heparin results are below expected values, and patient dosage has  been confirmed, suggest follow up testing of antithrombin III levels. Performed at [facility], [address], [place], [place] [address]    ECHOCARDIOGRAM COMPLETE  Result Date: 01/06/2022    ECHOCARDIOGRAM REPORT   Patient Name:   PAULINE TRAINER Date of Exam: 01/06/2022 Medical Rec #:  [id number]      Height:       70.0 in Accession #:    [id number]     Weight:       180.0 lb Date of Birth:  [birth year]      BSA:          1.996 m Patient Age:    51 years       BP:           134/103 mmHg Patient Gender: M              HR:           86 bpm. Exam Location:  Inpatient Procedure: 2D Echo and Intracardiac Opacification Agent Indications:    CHF  History:        Patient has prior history of Echocardiogram examinations, most                 recent 09/19/2020. CHF, CAD; Risk Factors:Hypertension and                 Dyslipidemia.  Sonographer:    Cathie Hoops Referring Phys: [id number] SARA-MAIZ A THOMAS IMPRESSIONS  1. Left ventricular ejection fraction, by estimation, is 20 to 25%. The left  ventricle has severely decreased function. The left ventricle demonstrates global hypokinesis. The left ventricular internal cavity size was mildly to moderately dilated. Moderate to severe left ventricular hypertrophy. Left ventricular diastolic function could not be evaluated.  2. Right ventricular systolic function is severely reduced. The right ventricular size is moderately enlarged. There is mildly elevated pulmonary artery systolic pressure. The estimated right ventricular systolic pressure is 37.3 mmHg.  3. Left atrial size was mildly dilated.  4. Right atrial size was mild to moderately dilated.  5. The mitral valve is normal in structure. Trivial mitral valve regurgitation. No evidence of mitral stenosis.  6. The aortic valve is tricuspid. Aortic valve regurgitation is not visualized. No aortic stenosis is present.  7. The inferior vena cava is dilated in size with <50% respiratory variability, suggesting right atrial pressure of 15 mmHg. Comparison(s): Changes from prior study are noted. LVEF worsened to 20-25% now compared to 40-45% from 2022. FINDINGS  Left Ventricle: Left ventricular ejection fraction, by estimation, is 20 to 25%. The left ventricle has severely decreased function. The left ventricle demonstrates global hypokinesis. Definity contrast agent was given IV to delineate the left ventricular endocardial borders. The left ventricular internal cavity size was mildly to moderately dilated. Moderate to severe left ventricular hypertrophy. Left ventricular diastolic function could not be evaluated due to nondiagnostic images. Left ventricular diastolic function could not be evaluated. Right Ventricle: The right ventricular size is moderately enlarged. No increase in right ventricular wall thickness. Right ventricular systolic function is severely reduced. There is mildly elevated pulmonary artery systolic pressure. The tricuspid regurgitant velocity is 2.36 m/s, and with an assumed  right atrial  pressure of 15 mmHg, the estimated right ventricular systolic pressure is 62.8 mmHg. Left Atrium: Left atrial size was mildly dilated. Right Atrium: Right atrial size was mild to moderately dilated. Pericardium: Trivial pericardial effusion is present. Mitral Valve: The mitral valve is normal in structure. Trivial mitral valve regurgitation. No evidence of mitral valve stenosis. Tricuspid Valve: The tricuspid valve is normal in structure. Tricuspid valve regurgitation is trivial. No evidence of tricuspid stenosis. Aortic Valve: The aortic valve is tricuspid. Aortic valve regurgitation is not visualized. No aortic stenosis is present. Aortic valve mean gradient measures 3.7 mmHg. Aortic valve peak gradient measures 6.9 mmHg. Aortic valve area, by VTI measures 1.35 cm. Pulmonic Valve: The pulmonic valve was normal in structure. Pulmonic valve regurgitation is mild to moderate. No evidence of pulmonic stenosis. Aorta: The aortic root and ascending aorta are structurally normal, with no evidence of dilitation. Venous: The inferior vena cava is dilated in size with less than 50% respiratory variability, suggesting right atrial pressure of 15 mmHg. IAS/Shunts: No atrial level shunt detected by color flow Doppler.  LEFT VENTRICLE PLAX 2D LVIDd:         5.80 cm      Diastology LVIDs:         4.90 cm      LV e' medial:  3.70 cm/s LV PW:         1.50 cm      LV e' lateral: 9.57 cm/s LV IVS:        1.50 cm LVOT diam:     2.00 cm LV SV:         31 LV SV Index:   15 LVOT Area:     3.14 cm  LV Volumes (MOD) LV vol d, MOD A2C: 282.5 ml LV vol d, MOD A4C: 260.5 ml LV vol s, MOD A2C: 219.5 ml LV vol s, MOD A4C: 176.0 ml LV SV MOD A2C:     63.0 ml LV SV MOD A4C:     260.5 ml LV SV MOD BP:      77.2 ml RIGHT VENTRICLE RV Basal diam:  4.70 cm RV Mid diam:    3.90 cm RV S prime:     8.38 cm/s TAPSE (M-mode): 1.0 cm LEFT ATRIUM             Index        RIGHT ATRIUM           Index LA diam:        4.00 cm 2.00 cm/m   RA Area:     22.30  cm LA Vol (A2C):   88.7 ml 44.44 ml/m  RA Volume:   70.10 ml  35.12 ml/m LA Vol (A4C):   58.3 ml 29.21 ml/m LA Biplane Vol: 71.4 ml 35.77 ml/m  AORTIC VALVE                    PULMONIC VALVE AV Area (Vmax):    1.87 cm     PV Vmax:          1.07 m/s AV Area (Vmean):   1.67 cm     PV Peak grad:     4.6 mmHg AV Area (VTI):     1.35 cm     PR End Diast Vel: 3.51 msec AV Vmax:           131.00 cm/s AV Vmean:          86.967 cm/s AV VTI:  0.229 m AV Peak Grad:      6.9 mmHg AV Mean Grad:      3.7 mmHg LVOT Vmax:         78.10 cm/s LVOT Vmean:        46.100 cm/s LVOT VTI:          0.098 m LVOT/AV VTI ratio: 0.43  AORTA Ao Root diam: 3.20 cm Ao Asc diam:  3.60 cm MR Peak grad: 28.6 mmHg   TRICUSPID VALVE MR Vmax:      267.50 cm/s TR Peak grad:   22.3 mmHg                           TR Vmax:        236.00 cm/s                            SHUNTS                           Systemic VTI:  0.10 m                           Systemic Diam: 2.00 cm Vishnu Priya Mallipeddi Electronically signed by Winfield RastVishnu Priya Mallipeddi Signature Date/Time: 01/06/2022/1:15:15 PM    Final    CT Angio Chest Pulmonary Embolism (PE) W or WO Contrast  Result Date: 01/06/2022 CLINICAL DATA:  Shortness of breath with pulmonary embolism suspected EXAM: CT ANGIOGRAPHY CHEST WITH CONTRAST TECHNIQUE: Multidetector CT imaging of the chest was performed using the standard protocol during bolus administration of intravenous contrast. Multiplanar CT image reconstructions and MIPs were obtained to evaluate the vascular anatomy. RADIATION DOSE REDUCTION: This exam was performed according to the departmental dose-optimization program which includes automated exposure control, adjustment of the mA and/or kV according to patient size and/or use of iterative reconstruction technique. CONTRAST:  75mL OMNIPAQUE IOHEXOL 350 MG/ML SOLN COMPARISON:  None Available. FINDINGS: Cardiovascular: Satisfactory opacification of the pulmonary arteries. Posterior  segment pulmonary embolism to the left upper lobe. Non filling of right lower lobe subsegmental branches where there are changes of pulmonary infarct is well. Medial segmental right middle lobe pulmonary embolism with pulmonary infarct. Cannot accurately assess the RV to LV ratio given non opacification of the left ventricle, there is multi chamber cardiac enlargement at baseline is well. Non-opacified aorta Mediastinum/Nodes: No mass or adenopathy Lungs/Pleura: Ground-glass opacity in the right middle and lower lobes associated with emboli. Interlobular septal thickening diffusely with small right pleural effusion Upper Abdomen: Hepatic venous reflux attributed to elevated right heart pressures, possibly chronic given the cardiac size. Musculoskeletal: No acute finding Review of the MIP images confirms the above findings. Critical Value/emergent results were called by telephone at the time of interpretation on 01/06/2022 at 7:40 am to provider Dr. Jomarie LongsJoseph, who verbally acknowledged these results. IMPRESSION: 1. Positive for multifocal acute pulmonary emboli, segmental to subsegmental, with infarcts in the right middle and lower lobes. 2. Cardiomegaly with mild interstitial pulmonary edema and small right pleural effusion. Electronically Signed   By: Tiburcio PeaJonathan  Watts M.D.   On: 01/06/2022 07:40   DG Chest 2 View  Result Date: 01/05/2022 CLINICAL DATA:  Shortness of breath EXAM: CHEST - 2 VIEW COMPARISON:  09/19/2020 FINDINGS: Transverse diameter of heart is increased. Central pulmonary vessels are more prominent. Increased interstitial markings are seen in parahilar regions  and lower lung fields. Linear patchy densities are seen in right lower lung field. There is blunting of right lateral CP angle. There is no pneumothorax. IMPRESSION: Cardiomegaly. Central pulmonary vessels are more prominent suggesting CHF. Increased markings are seen in right lower lung fields suggesting atelectasis/pneumonia. Part of this  finding may be due to asymmetric pulmonary edema. Small right pleural effusion is seen. Electronically Signed   By: Ernie Avena M.D.   On: 01/05/2022 21:27    Pending Labs Unresulted Labs (From admission, onward)     Start     Ordered   01/07/22 0500  CBC  Daily,   R      01/06/22 0816   01/07/22 0500  Heparin level (unfractionated)  Daily,   R      01/06/22 0816   01/07/22 0500  Basic metabolic panel  Tomorrow morning,   R        01/06/22 1042   01/07/22 0500  Ferritin  Daily,   R      01/06/22 1043   01/07/22 0500  C-reactive protein  Daily,   R      01/06/22 1043   01/06/22 1630  Heparin level (unfractionated)  Once-Timed,   TIMED       Comments: Previous heparin level not drawn at correct time    01/06/22 1515   01/06/22 0105  Hemoglobin A1c  Once,   R        01/06/22 0111            Vitals/Pain Today's Vitals   01/06/22 0800 01/06/22 1002 01/06/22 1214 01/06/22 1430  BP:  (!) 143/117 (!) 120/98 126/88  Pulse:  82 82 74  Resp:  16 18 17   Temp:  98.1 F (36.7 C) 98.6 F (37 C) 98.6 F (37 C)  TempSrc:  Oral Oral Oral  SpO2:  95% 96% 97%  Weight: 81.6 kg     Height: 5\' 10"  (1.778 m)     PainSc:        Isolation Precautions Airborne and Contact precautions  Medications Medications  nitroGLYCERIN (NITROSTAT) SL tablet 0.4 mg (has no administration in time range)  furosemide (LASIX) injection 40 mg (40 mg Intravenous Given 01/06/22 0930)  carvedilol (COREG) tablet 12.5 mg (12.5 mg Oral Given 01/06/22 0931)  aspirin chewable tablet 81 mg (81 mg Oral Given 01/06/22 0930)  amLODipine (NORVASC) tablet 5 mg (5 mg Oral Given 01/06/22 0931)  atorvastatin (LIPITOR) tablet 80 mg (has no administration in time range)  isosorbide mononitrate (IMDUR) 24 hr tablet 30 mg (30 mg Oral Given 01/06/22 0930)  spironolactone (ALDACTONE) tablet 25 mg (25 mg Oral Given 01/06/22 0930)  dapagliflozin propanediol (FARXIGA) tablet 10 mg (10 mg Oral Given 01/06/22 0946)  clopidogrel  (PLAVIX) tablet 75 mg (75 mg Oral Given 01/06/22 0931)  acetaminophen (TYLENOL) tablet 650 mg (has no administration in time range)    Or  acetaminophen (TYLENOL) suppository 650 mg (has no administration in time range)  fentaNYL (SUBLIMAZE) injection 12.5-50 mcg (has no administration in time range)  ondansetron (ZOFRAN) tablet 4 mg (has no administration in time range)    Or  ondansetron (ZOFRAN) injection 4 mg (has no administration in time range)  albuterol (PROVENTIL) (2.5 MG/3ML) 0.083% nebulizer solution 2.5 mg (has no administration in time range)  nirmatrelvir/ritonavir (PAXLOVID) 3 tablet (3 tablets Oral Given 01/06/22 0947)  guaiFENesin-dextromethorphan (ROBITUSSIN DM) 100-10 MG/5ML syrup 10 mL (has no administration in time range)  chlorpheniramine-HYDROcodone (TUSSIONEX) 10-8 MG/5ML suspension 5 mL (  has no administration in time range)  hydrALAZINE (APRESOLINE) injection 20 mg (20 mg Intravenous Given 01/06/22 0930)  heparin ADULT infusion 100 units/mL (25000 units/283mL) (1,450 Units/hr Intravenous New Bag/Given 01/06/22 0950)  perflutren lipid microspheres (DEFINITY) IV suspension (2 mLs Intravenous Given 01/06/22 1206)  furosemide (LASIX) injection 40 mg (40 mg Intravenous Given 01/05/22 2346)  aspirin chewable tablet 324 mg (324 mg Oral Given 01/05/22 2346)  iohexol (OMNIPAQUE) 350 MG/ML injection 75 mL (75 mLs Intravenous Contrast Given 01/06/22 0711)  heparin bolus via infusion 5,000 Units (5,000 Units Intravenous Bolus from Bag 01/06/22 0951)    Mobility walks Low fall risk   Focused Assessments Pulmonary Assessment Handoff:  Lung sounds: Bilateral Breath Sounds: Diminished L Breath Sounds: Diminished O2 Device: Room Air      R Recommendations: See Admitting Provider Note  Report given to:   Additional Notes:

## 2022-01-06 NOTE — Progress Notes (Signed)
Progress Note  Patient Name: Russell Baldwin Date of Encounter: 01/06/2022  Primary Cardiologist: Evalina Field, MD  Subjective   No acute events overnight. No symptoms.  Inpatient Medications    Scheduled Meds:  amLODipine  5 mg Oral Daily   aspirin  81 mg Oral Daily   atorvastatin  80 mg Oral q1800   carvedilol  12.5 mg Oral BID WC   clopidogrel  75 mg Oral Daily   dapagliflozin propanediol  10 mg Oral Daily   furosemide  40 mg Intravenous BID   isosorbide mononitrate  30 mg Oral Daily   nirmatrelvir/ritonavir  3 tablet Oral BID   spironolactone  25 mg Oral Daily   Continuous Infusions:  heparin 1,450 Units/hr (01/06/22 0950)   PRN Meds: acetaminophen **OR** acetaminophen, albuterol, chlorpheniramine-HYDROcodone, fentaNYL (SUBLIMAZE) injection, guaiFENesin-dextromethorphan, hydrALAZINE, nitroGLYCERIN, ondansetron **OR** ondansetron (ZOFRAN) IV, perflutren lipid microspheres (DEFINITY) IV suspension   Vital Signs    Vitals:   01/06/22 0500 01/06/22 0800 01/06/22 1002 01/06/22 1214  BP: (!) 134/103  (!) 143/117 (!) 120/98  Pulse: 81  82 82  Resp: 18  16 18   Temp: 97.7 F (36.5 C)  98.1 F (36.7 C) 98.6 F (37 C)  TempSrc: Oral  Oral Oral  SpO2: 94%  95% 96%  Weight:  81.6 kg    Height:  5\' 10"  (1.778 m)      Intake/Output Summary (Last 24 hours) at 01/06/2022 1244 Last data filed at 01/06/2022 0155 Gross per 24 hour  Intake 480 ml  Output 1200 ml  Net -720 ml   Filed Weights   01/06/22 0800  Weight: 81.6 kg    Telemetry     Personally reviewed.  ECG   NSR and LVH with repol  Physical Exam   GEN: No acute distress.   Neck: No JVD. Cardiac: RRR, no murmur, rub, or gallop.  Respiratory: Nonlabored. Clear to auscultation bilaterally. GI: Soft, nontender, bowel sounds present. MS: No edema; No deformity. Neuro:  Nonfocal. Psych: Alert and oriented x 3. Normal affect.  Labs    Chemistry Recent Labs  Lab 01/05/22 2116 01/06/22 0220  01/06/22 0326  NA 135  --  135  K 3.7  --  3.6  CL 102  --  99  CO2 23  --  27  GLUCOSE 112*  --  141*  BUN 13  --  13  CREATININE 1.29* 1.30* 1.36*  CALCIUM 8.6*  --  8.6*  PROT 6.2*  --  6.3*  ALBUMIN 3.2*  --  3.2*  AST 60*  --  54*  ALT 88*  --  86*  ALKPHOS 54  --  56  BILITOT 1.6*  --  1.6*  GFRNONAA >60 >60 >60  ANIONGAP 10  --  9     Hematology Recent Labs  Lab 01/05/22 2116 01/06/22 0220 01/06/22 0326  WBC 8.4 9.5 10.7*  RBC 5.06 4.95 5.00  HGB 10.6* 10.5* 10.5*  HCT 35.5* 34.8* 34.9*  MCV 70.2* 70.3* 69.8*  MCH 20.9* 21.2* 21.0*  MCHC 29.9* 30.2 30.1  RDW 18.4* 18.2* 18.1*  PLT 318 286 300    Cardiac Enzymes Recent Labs  Lab 01/05/22 2116 01/05/22 2352  TROPONINIHS 163* 164*    BNP Recent Labs  Lab 01/05/22 2116  BNP 2,538.5*     DDimer Recent Labs  Lab 01/06/22 0220  DDIMER 4.68*     Radiology    CT Angio Chest Pulmonary Embolism (PE) W or WO Contrast  Result  Date: 01/06/2022 CLINICAL DATA:  Shortness of breath with pulmonary embolism suspected EXAM: CT ANGIOGRAPHY CHEST WITH CONTRAST TECHNIQUE: Multidetector CT imaging of the chest was performed using the standard protocol during bolus administration of intravenous contrast. Multiplanar CT image reconstructions and MIPs were obtained to evaluate the vascular anatomy. RADIATION DOSE REDUCTION: This exam was performed according to the departmental dose-optimization program which includes automated exposure control, adjustment of the mA and/or kV according to patient size and/or use of iterative reconstruction technique. CONTRAST:  76mL OMNIPAQUE IOHEXOL 350 MG/ML SOLN COMPARISON:  None Available. FINDINGS: Cardiovascular: Satisfactory opacification of the pulmonary arteries. Posterior segment pulmonary embolism to the left upper lobe. Non filling of right lower lobe subsegmental branches where there are changes of pulmonary infarct is well. Medial segmental right middle lobe pulmonary embolism  with pulmonary infarct. Cannot accurately assess the RV to LV ratio given non opacification of the left ventricle, there is multi chamber cardiac enlargement at baseline is well. Non-opacified aorta Mediastinum/Nodes: No mass or adenopathy Lungs/Pleura: Ground-glass opacity in the right middle and lower lobes associated with emboli. Interlobular septal thickening diffusely with small right pleural effusion Upper Abdomen: Hepatic venous reflux attributed to elevated right heart pressures, possibly chronic given the cardiac size. Musculoskeletal: No acute finding Review of the MIP images confirms the above findings. Critical Value/emergent results were called by telephone at the time of interpretation on 01/06/2022 at 7:40 am to provider Dr. Jomarie Longs, who verbally acknowledged these results. IMPRESSION: 1. Positive for multifocal acute pulmonary emboli, segmental to subsegmental, with infarcts in the right middle and lower lobes. 2. Cardiomegaly with mild interstitial pulmonary edema and small right pleural effusion. Electronically Signed   By: Tiburcio Pea M.D.   On: 01/06/2022 07:40   DG Chest 2 View  Result Date: 01/05/2022 CLINICAL DATA:  Shortness of breath EXAM: CHEST - 2 VIEW COMPARISON:  09/19/2020 FINDINGS: Transverse diameter of heart is increased. Central pulmonary vessels are more prominent. Increased interstitial markings are seen in parahilar regions and lower lung fields. Linear patchy densities are seen in right lower lung field. There is blunting of right lateral CP angle. There is no pneumothorax. IMPRESSION: Cardiomegaly. Central pulmonary vessels are more prominent suggesting CHF. Increased markings are seen in right lower lung fields suggesting atelectasis/pneumonia. Part of this finding may be due to asymmetric pulmonary edema. Small right pleural effusion is seen. Electronically Signed   By: Ernie Avena M.D.   On: 01/05/2022 21:27    Cardiac Studies   Echo pending, prelim images  review showed severely reduced LVEF  Assessment & Plan    Patient is a 52 y/o M known to have CAD s/p RCA PCI with recent LHC in 2022 showing RCA CTO after previously placed stents, RCA ISR 60% with LVEF 40-45%, HTN, HLD, ICM 40-45%, admitted to medicine team for ADHF and Covid infection. Cardiology consulted for troponin elevation and ADHF.  # ADHF, new onset of severely reduced LVEF on this admission -Increase IV Lasix 40 mg from BID to TID -Continue Coreg 3.125 mg BID -Will start Entresto once compensated -Continue Spironolactone 25 mg once daily -Daily weights, I&O, 1.2 L fluid restriction  # CAD s/p RCA ISR 60% and RCA CTO distal to the stent - Continue aspirin and high intensity statin  #HTN, controlled -Increase IV Lasix 40 mg from BID to TID -Continue Coreg 3.125 mg BID -Will start Entresto once compensated -Continue Spironolactone 25 mg once daily  #Covid infection -Management per primary team  I have spent a  total of 33 minutes with patient reviewing chart , telemetry, EKGs, labs and examining patient as well as establishing an assessment and plan that was discussed with the patient.  > 50% of time was spent in direct patient care.      Signed, Chalmers Guest, MD  01/06/2022, 12:44 PM

## 2022-01-06 NOTE — Progress Notes (Signed)
[facility] for heparin Indication: pulmonary embolus  Heparin Dosing Weight: 81.6 kg  Labs: Recent Labs    01/05/22 2116 01/06/22 0220 01/06/22 0326 01/06/22 1232 01/06/22 1620  HGB 10.6* 10.5* 10.5*  --   --   HCT 35.5* 34.8* 34.9*  --   --   PLT 318 286 300  --   --   LABPROT  --   --  16.3*  --   --   INR  --   --  1.3*  --   --   HEPARINUNFRC  --   --   --  0.54 0.58  CREATININE 1.29* 1.30* 1.36*  --   --      Assessment: 75 yom with hx CAD s/p inferior wall STEMI with DES to RCA in 2012 presenting with SOB/CP, leg swelling. Noted Covid-19+, unclear timing of infection. Patient is not on anticoagulation PTA and has reportedly been noncompliant with his home medications for a year. D-dimer elevated, and CTA positive for acute multifocal PE. Pharmacy consulted to to dose heparin.  Patient started on 1450 unit/hr heparin following 5000 unit IV heparin bolus. Resulting heparin level is back at 0.58 which is therapeutic.   Hgb 10.5; plt 300 SCr up slightly from previous to 1.36    Goal of Therapy:  Heparin level 0.3-0.7 units/ml Monitor platelets by anticoagulation protocol: Yes   Plan:  Continue heparin at current rate Confirm heparin level at 2300 Monitor daily CBC, s/sx bleeding F/u long-term plan for anticoagulation  Lorelei Pont, PharmD, BCPS 01/06/2022 5:16 PM ED Clinical Pharmacist -  [phone]

## 2022-01-06 NOTE — Progress Notes (Addendum)
ANTICOAGULATION CONSULT NOTE  Pharmacy Consult for heparin Indication: pulmonary embolus  Heparin Dosing Weight: 81.6 kg  Labs: Recent Labs    01/05/22 2116 01/06/22 0220 01/06/22 0326  HGB 10.6* 10.5* 10.5*  HCT 35.5* 34.8* 34.9*  PLT 318 286 300  LABPROT  --   --  16.3*  INR  --   --  1.3*  CREATININE 1.29* 1.30* 1.36*    Assessment: 75 yom with hx CAD s/p inferior wall STEMI with DES to RCA in 2012 presenting with SOB/CP, leg swelling. Noted Covid-19+, unclear timing of infection. Patient is not on anticoagulation PTA and has reportedly been noncompliant with his home medications for a year. D-dimer elevated, and CTA positive for acute multifocal PE. Pharmacy consulted to to dose heparin. CBC stable. SCr up slightly from previous to 1.36. Patient initially started on heparin 5000 units SQ q8h for VTE prophylaxis - received 1 dose, given 1/6 at 0530. Estimated weight in the ER 180 lb per RN discussion with patient (consistent with previous values).  Goal of Therapy:  Heparin level 0.3-0.7 units/ml Monitor platelets by anticoagulation protocol: Yes   Plan:  D/c heparin SQ Heparin 5000 unit bolus x 1 (lower end of range with recent heparin SQ dose) Start heparin infusion at 1450 units/hr Check 6hr heparin level Monitor daily CBC, s/sx bleeding F/u long-term plan for anticoagulation   Arturo Morton, PharmD, BCPS Clinical Pharmacist 01/06/2022 7:52 AM

## 2022-01-06 NOTE — Progress Notes (Signed)
  Echocardiogram 2D Echocardiogram has been performed.  Lana Fish 01/06/2022, 12:17 PM

## 2022-01-06 NOTE — Progress Notes (Addendum)
PROGRESS NOTE    Russell Baldwin  JJH:[id number] DOB: 02-26-1970 DOA: 01/05/2022 PCP: Pcp, No  52 y.o. male with a hx of essential hypertension, HLD, ischemic cardiomyopathy, coronary artery disease status post STEMI with PCI and DES to his proximal RCA in 2016. EF 40-45%, G2 DD, mild mitral valve regurgitation, had a cath 9/22 which noted single-vessel CAD, 100% stenosis of mid RCA with collaterals that was managed medically presented to the ED with worsening shortness of breath, abdominal wall tightness and leg swelling for 3 to 4 days, history of PND and orthopnea and some pleuritic chest pain with cough. -He has been off his medications for a few months, has not seen a cardiologist last year In the ED hypertensive blood pressure 174/131, chest x-ray with pulmonary edema, creatinine 1.2, BNP 2539, troponin 163, COVID-positive -1/6, CTA chest positive for bilateral pulmonary embolism, pulmonary infarct  Subjective: -Starting to feel a bit better  Assessment and Plan:  Acute on chronic systolic CHF -Known ischemic cardiomyopathy with EF of 40% -Recent poor compliance with diuretics and follow-up -Continue IV Lasix 40 Mg twice daily, Aldactone, Farxiga -Follow-up echocardiogram -Will need to reestablish with [facility] care, TOC follow-up  CAD -Prior RCA stent 2016 -Cath 9/22 with 100% mid RCA stenosis with collaterals, managed medically -Continue aspirin, Plavix, statin, beta-blocker -Cards consulting  Acute bilateral PE -Could be in the setting of COVID, patient reports Covid Symptoms over a month ago -Start IV heparin, follow-up echo, transition to Eliquis in 48 hours  SARS COVID-19 infection -No recent symptoms, patient reportedly had symptoms in late November -Monitor clinically, airborne precautions X 10 days, continue Paxlovid day 2 -No hypoxia, CT chest without evidence of COVID-pneumonia  Essential hypertension -Imdur, Coreg, Aldactone, Lasix   DVT prophylaxis: IV  heparin Code Status: Full code Family Communication: None present Disposition Plan: Home likely 2 to 3 days  Consultants: Cardiology   Procedures:   Antimicrobials:    Objective: Vitals:   01/06/22 0400 01/06/22 0500 01/06/22 0800 01/06/22 1002  BP: (!) 139/107 (!) 134/103  (!) 143/117  Pulse: 74 81  82  Resp: 18 18  16   Temp:  97.7 F (36.5 C)  98.1 F (36.7 C)  TempSrc:  Oral  Oral  SpO2: 96% 94%  95%  Weight:   81.6 kg   Height:   5\' 10"  (1.778 m)     Intake/Output Summary (Last 24 hours) at 01/06/2022 1033 Last data filed at 01/06/2022 0155 Gross per 24 hour  Intake 480 ml  Output 1200 ml  Net -720 ml   Filed Weights   01/06/22 0800  Weight: 81.6 kg    Examination:  General exam: Appears calm and comfortable, AAOx3 HEENT: Positive JVD CVS: S1-S2, regular rhythm Lungs: Few basilar rales Abdomen: Soft, obese, nontender, bowel sounds present Extremities: 2+ edema Skin: No rashes Psychiatry:  Mood & affect appropriate.     Data Reviewed:   CBC: Recent Labs  Lab 01/05/22 2116 01/06/22 0220 01/06/22 0326  WBC 8.4 9.5 10.7*  NEUTROABS 7.1  --   --   HGB 10.6* 10.5* 10.5*  HCT 35.5* 34.8* 34.9*  MCV 70.2* 70.3* 69.8*  PLT 318 286 300   Basic Metabolic Panel: Recent Labs  Lab 01/05/22 2116 01/06/22 0220 01/06/22 0326  NA 135  --  135  K 3.7  --  3.6  CL 102  --  99  CO2 23  --  27  GLUCOSE 112*  --  141*  BUN 13  --  13  CREATININE 1.29* 1.30* 1.36*  CALCIUM 8.6*  --  8.6*   GFR: Estimated Creatinine Clearance: 67.1 mL/min (A) (by C-G formula based on SCr of 1.36 mg/dL (H)). Liver Function Tests: Recent Labs  Lab 01/05/22 2116 01/06/22 0326  AST 60* 54*  ALT 88* 86*  ALKPHOS 54 56  BILITOT 1.6* 1.6*  PROT 6.2* 6.3*  ALBUMIN 3.2* 3.2*   No results for input(s): "LIPASE", "AMYLASE" in the last 168 hours. No results for input(s): "AMMONIA" in the last 168 hours. Coagulation Profile: Recent Labs  Lab 01/06/22 0326  INR 1.3*    Cardiac Enzymes: No results for input(s): "CKTOTAL", "CKMB", "CKMBINDEX", "TROPONINI" in the last 168 hours. BNP (last 3 results) No results for input(s): "PROBNP" in the last 8760 hours. HbA1C: No results for input(s): "HGBA1C" in the last 72 hours. CBG: No results for input(s): "GLUCAP" in the last 168 hours. Lipid Profile: No results for input(s): "CHOL", "HDL", "LDLCALC", "TRIG", "CHOLHDL", "LDLDIRECT" in the last 72 hours. Thyroid Function Tests: Recent Labs    01/06/22 0220  TSH 1.662   Anemia Panel: Recent Labs    01/06/22 0220  FERRITIN 30   Urine analysis:    Component Value Date/Time   COLORURINE YELLOW 05/23/2010 1009   [place] 05/23/2010 1009   LABSPEC >=1.030 08/27/2012 1820   PHURINE 6.0 08/27/2012 1820   GLUCOSEU NEGATIVE 08/27/2012 1820   HGBUR NEGATIVE 08/27/2012 1820   BILIRUBINUR NEGATIVE 08/27/2012 1820   KETONESUR NEGATIVE 08/27/2012 1820   PROTEINUR 30 (A) 08/27/2012 1820   UROBILINOGEN 0.2 08/27/2012 1820   NITRITE NEGATIVE 08/27/2012 1820   LEUKOCYTESUR NEGATIVE 08/27/2012 1820   Sepsis Labs: @LABRCNTIP (procalcitonin:4,lacticidven:4)  ) Recent Results (from the past 240 hour(s))  Resp panel by RT-PCR (RSV, Flu A&B, Covid) Anterior Nasal Swab     Status: Abnormal   Collection Time: 01/05/22  9:11 PM   Specimen: Anterior Nasal Swab  Result Value Ref Range Status   SARS Coronavirus 2 by RT PCR POSITIVE (A) NEGATIVE Final    Comment: (NOTE) SARS-CoV-2 target nucleic acids are DETECTED.  The SARS-CoV-2 RNA is generally detectable in upper respiratory specimens during the acute phase of infection. Positive results are indicative of the presence of the identified virus, but do not rule out bacterial infection or co-infection with other pathogens not detected by the test. Clinical correlation with patient history and other diagnostic information is necessary to determine patient infection status. The expected result is  Negative.  Fact Sheet for Patients: EntrepreneurPulse.com.au  Fact Sheet for Healthcare Providers: IncredibleEmployment.be  This test is not yet approved or cleared by the [place] FDA and  has been authorized for detection and/or diagnosis of SARS-CoV-2 by FDA under an Emergency Use Authorization (EUA).  This EUA will remain in effect (meaning this test can be used) for the duration of  the COVID-19 declaration under Section 564(b)(1) of the A ct, 21 U.S.C. section 360bbb-3(b)(1), unless the authorization is terminated or revoked sooner.     Influenza A by PCR NEGATIVE NEGATIVE Final   Influenza B by PCR NEGATIVE NEGATIVE Final    Comment: (NOTE) The Xpert Xpress SARS-CoV-2/FLU/RSV plus assay is intended as an aid in the diagnosis of influenza from Nasopharyngeal swab specimens and should not be used as a sole basis for treatment. Nasal washings and aspirates are unacceptable for Xpert Xpress SARS-CoV-2/FLU/RSV testing.  Fact Sheet for Patients: EntrepreneurPulse.com.au  Fact Sheet for Healthcare Providers: IncredibleEmployment.be  This test is not yet approved or cleared by  the [employer] and has been authorized for detection and/or diagnosis of SARS-CoV-2 by FDA under an Emergency Use Authorization (EUA). This EUA will remain in effect (meaning this test can be used) for the duration of the COVID-19 declaration under Section 564(b)(1) of the Act, 21 U.S.C. section 360bbb-3(b)(1), unless the authorization is terminated or revoked.     Resp Syncytial Virus by PCR NEGATIVE NEGATIVE Final    Comment: (NOTE) Fact Sheet for Patients: BloggerCourse.com  Fact Sheet for Healthcare Providers: SeriousBroker.it  This test is not yet approved or cleared by the [place] FDA and has been authorized for detection and/or diagnosis of SARS-CoV-2  by FDA under an Emergency Use Authorization (EUA). This EUA will remain in effect (meaning this test can be used) for the duration of the COVID-19 declaration under Section 564(b)(1) of the Act, 21 U.S.C. section 360bbb-3(b)(1), unless the authorization is terminated or revoked.  Performed at [facility], [address], [place], [place] [address]      Radiology Studies: CT Angio Chest Pulmonary Embolism (PE) W or WO Contrast  Result Date: 01/06/2022 CLINICAL DATA:  Shortness of breath with pulmonary embolism suspected EXAM: CT ANGIOGRAPHY CHEST WITH CONTRAST TECHNIQUE: Multidetector CT imaging of the chest was performed using the standard protocol during bolus administration of intravenous contrast. Multiplanar CT image reconstructions and MIPs were obtained to evaluate the vascular anatomy. RADIATION DOSE REDUCTION: This exam was performed according to the departmental dose-optimization program which includes automated exposure control, adjustment of the mA and/or kV according to patient size and/or use of iterative reconstruction technique. CONTRAST:  50mL OMNIPAQUE IOHEXOL 350 MG/ML SOLN COMPARISON:  None Available. FINDINGS: Cardiovascular: Satisfactory opacification of the pulmonary arteries. Posterior segment pulmonary embolism to the left upper lobe. Non filling of right lower lobe subsegmental branches where there are changes of pulmonary infarct is well. Medial segmental right middle lobe pulmonary embolism with pulmonary infarct. Cannot accurately assess the RV to LV ratio given non opacification of the left ventricle, there is multi chamber cardiac enlargement at baseline is well. Non-opacified aorta Mediastinum/Nodes: No mass or adenopathy Lungs/Pleura: Ground-glass opacity in the right middle and lower lobes associated with emboli. Interlobular septal thickening diffusely with small right pleural effusion Upper Abdomen: Hepatic venous reflux attributed to elevated right heart  pressures, possibly chronic given the cardiac size. Musculoskeletal: No acute finding Review of the MIP images confirms the above findings. Critical Value/emergent results were called by telephone at the time of interpretation on 01/06/2022 at 7:40 am to provider Dr. Jomarie Longs, who verbally acknowledged these results. IMPRESSION: 1. Positive for multifocal acute pulmonary emboli, segmental to subsegmental, with infarcts in the right middle and lower lobes. 2. Cardiomegaly with mild interstitial pulmonary edema and small right pleural effusion. Electronically Signed   By: Tiburcio Pea M.D.   On: 01/06/2022 07:40   DG Chest 2 View  Result Date: 01/05/2022 CLINICAL DATA:  Shortness of breath EXAM: CHEST - 2 VIEW COMPARISON:  09/19/2020 FINDINGS: Transverse diameter of heart is increased. Central pulmonary vessels are more prominent. Increased interstitial markings are seen in parahilar regions and lower lung fields. Linear patchy densities are seen in right lower lung field. There is blunting of right lateral CP angle. There is no pneumothorax. IMPRESSION: Cardiomegaly. Central pulmonary vessels are more prominent suggesting CHF. Increased markings are seen in right lower lung fields suggesting atelectasis/pneumonia. Part of this finding may be due to asymmetric pulmonary edema. Small right pleural effusion is seen. Electronically Signed   By:  Ernie Avena M.D.   On: 01/05/2022 21:27     Scheduled Meds:  amLODipine  5 mg Oral Daily   aspirin  81 mg Oral Daily   atorvastatin  80 mg Oral q1800   carvedilol  12.5 mg Oral BID WC   clopidogrel  75 mg Oral Daily   dapagliflozin propanediol  10 mg Oral Daily   furosemide  40 mg Intravenous BID   isosorbide mononitrate  30 mg Oral Daily   nirmatrelvir/ritonavir  3 tablet Oral BID   spironolactone  25 mg Oral Daily   Continuous Infusions:  heparin 1,450 Units/hr (01/06/22 0950)     LOS: 0 days    Time spent:    Zannie Cove, MD [facility]   01/06/2022, 10:33 AM

## 2022-01-06 NOTE — TOC Initial Note (Signed)
Transition of Care [facility]) - Initial/Assessment Note    Patient Details  Name: Russell Baldwin MRN: [id number] Date of Birth: 05/23/1970  Transition of Care [facility]) CM/SW Contact:    Verdell Carmine, RN Phone Number: 01/06/2022, 1:33 PM  Clinical Narrative:                 Patient in with PE, edema CHF exacerbation supposed to be on medications, has not taken them the last few months, NO PCP Will need to follow up and start on eliquis.   Expected Discharge Plan: Home/Self Care Barriers to Discharge: Continued Medical Work up   Patient Goals and CMS Choice            Expected Discharge Plan and Services       Living arrangements for the past 2 months: Single Family Home                                      Prior Living Arrangements/Services Living arrangements for the past 2 months: Single Family Home   Patient language and need for interpreter reviewed:: Yes        Need for Family Participation in Patient Care: Yes (Comment) Care giver support system in place?: Yes (comment)   Criminal Activity/Legal Involvement Pertinent to Current Situation/Hospitalization: No - Comment as needed  Activities of Daily Living      Permission Sought/Granted                  Emotional Assessment       Orientation: : Oriented to Self, Oriented to Place, Oriented to  Time Alcohol / Substance Use: Not Applicable Psych Involvement: No (comment)  Admission diagnosis:  CHF exacerbation ([place]) [I50.9] CHF (congestive heart failure) ([place]) [I50.9] Patient Active Problem List   Diagnosis Date Noted   CHF exacerbation ([place]) 01/06/2022   CHF (congestive heart failure) ([place]) 01/06/2022   Acute on chronic systolic heart failure ([place]) 01/06/2022   Presence of drug coated stent in right coronary artery    Ischemic cardiomyopathy    Prediabetes    Coronary artery disease    Primary hypertension    Hyperlipidemia    PCP:  Pcp, No Pharmacy:   [facility] [place], [place] [address] [place] [address] Phone: [phone] Fax: [facility] [place], [place] - [place] AT [facility] [place] [address] Phone: [phone] Fax: [phone]  [facility] [address] [place] [address] Phone: [phone] Fax: [phone]     Social Determinants of Health (SDOH) Social History: SDOH Screenings   Tobacco Use: Low Risk  (10/07/2020)   SDOH Interventions:     Readmission Risk Interventions     No data to display

## 2022-01-06 NOTE — Discharge Instructions (Signed)
Please take medication as prescribed. Stopping any medications could increase chances of reoccurrences /complications of your diagnosis   Information on my medicine - ELIQUIS (apixaban)  This medication education was reviewed with me or my healthcare representative as part of my discharge preparation.     Why was Eliquis prescribed for you? Eliquis was prescribed to treat blood clots that may have been found in the veins of your legs (deep vein thrombosis) or in your lungs (pulmonary embolism) and to reduce the risk of them occurring again.  What do You need to know about Eliquis ? The starting dose is 10 mg (two 5 mg tablets) taken TWICE daily for the FIRST SEVEN (7) DAYS, then on   01/15/22  the dose is reduced to ONE 5 mg tablet taken TWICE daily.  Eliquis may be taken with or without food.   Try to take the dose about the same time in the morning and in the evening. If you have difficulty swallowing the tablet whole please discuss with your pharmacist how to take the medication safely.  Take Eliquis exactly as prescribed and DO NOT stop taking Eliquis without talking to the doctor who prescribed the medication.  Stopping may increase your risk of developing a new blood clot.  Refill your prescription before you run out.  After discharge, you should have regular check-up appointments with your healthcare provider that is prescribing your Eliquis.    What do you do if you miss a dose? If a dose of ELIQUIS is not taken at the scheduled time, take it as soon as possible on the same day and twice-daily administration should be resumed. The dose should not be doubled to make up for a missed dose.  Important Safety Information A possible side effect of Eliquis is bleeding. You should call your healthcare provider right away if you experience any of the following: Bleeding from an injury or your nose that does not stop. Unusual colored urine (red or dark brown) or unusual colored stools  (red or black). Unusual bruising for unknown reasons. A serious fall or if you hit your head (even if there is no bleeding).  Some medicines may interact with Eliquis and might increase your risk of bleeding or clotting while on Eliquis. To help avoid this, consult your healthcare provider or pharmacist prior to using any new prescription or non-prescription medications, including herbals, vitamins, non-steroidal anti-inflammatory drugs (NSAIDs) and supplements.  This website has more information on Eliquis (apixaban): http://www.eliquis.com/eliquis/home   ====================================================  Pulmonary Embolism    A pulmonary embolism (PE) is a sudden blockage or decrease of blood flow in one or both lungs. Most blockages come from a blood clot that forms in the vein of a lower leg, thigh, or arm (deep vein thrombosis, DVT) and travels to the lungs. A clot is blood that has thickened into a gel or solid. PE is a dangerous and life-threatening condition that needs to be treated right away.  What are the causes? This condition is usually caused by a blood clot that forms in a vein and moves to the lungs. In rare cases, it may be caused by air, fat, part of a tumor, or other tissue that moves through the veins and into the lungs.  What increases the risk? The following factors may make you more likely to develop this condition: Experiencing a traumatic injury, such as breaking a hip or leg. Having: A spinal cord injury. Orthopedic surgery, especially hip or knee replacement. Any major surgery. A  stroke. DVT. Blood clots or blood clotting disease. Long-term (chronic) lung or heart disease. Cancer treated with chemotherapy. A central venous catheter. Taking medicines that contain estrogen. These include birth control pills and hormone replacement therapy. Being: Pregnant. In the period of time after your baby is delivered (postpartum). Older than age  50. Overweight. A smoker, especially if you have other risks.  What are the signs or symptoms? Symptoms of this condition usually start suddenly and include: Shortness of breath during activity or at rest. Coughing, coughing up blood, or coughing up blood-tinged mucus. Chest pain that is often worse with deep breaths. Rapid or irregular heartbeat. Feeling light-headed or dizzy. Fainting. Feeling anxious. Fever. Sweating. Pain and swelling in a leg. This is a symptom of DVT, which can lead to PE. How is this diagnosed? This condition may be diagnosed based on: Your medical history. A physical exam. Blood tests. CT pulmonary angiogram. This test checks blood flow in and around your lungs. Ventilation-perfusion scan, also called a lung VQ scan. This test measures air flow and blood flow to the lungs. An ultrasound of the legs.  How is this treated? Treatment for this condition depends on many factors, such as the cause of your PE, your risk for bleeding or developing more clots, and other medical conditions you have. Treatment aims to remove, dissolve, or stop blood clots from forming or growing larger. Treatment may include: Medicines, such as: Blood thinning medicines (anticoagulants) to stop clots from forming. Medicines that dissolve clots (thrombolytics). Procedures, such as: Using a flexible tube to remove a blood clot (embolectomy) or to deliver medicine to destroy it (catheter-directed thrombolysis). Inserting a filter into a large vein that carries blood to the heart (inferior vena cava). This filter (vena cava filter) catches blood clots before they reach the lungs. Surgery to remove the clot (surgical embolectomy). This is rare. You may need a combination of immediate, long-term (up to 3 months after diagnosis), and extended (more than 3 months after diagnosis) treatments. Your treatment may continue for several months (maintenance therapy). You and your health care provider  will work together to choose the treatment program that is best for you.  Follow these instructions at home: Medicines Take over-the-counter and prescription medicines only as told by your health care provider. If you are taking an anticoagulant medicine: Take the medicine every day at the same time each day. Understand what foods and drugs interact with your medicine. Understand the side effects of this medicine, including excessive bruising or bleeding. Ask your health care provider or pharmacist about other side effects.  General instructions Wear a medical alert bracelet or carry a medical alert card that says you have had a PE and lists what medicines you take. Ask your health care provider when you may return to your normal activities. Avoid sitting or lying for a long time without moving. Maintain a healthy weight. Ask your health care provider what weight is healthy for you. Do not use any products that contain nicotine or tobacco, such as cigarettes, e-cigarettes, and chewing tobacco. If you need help quitting, ask your health care provider. Talk with your health care provider about any travel plans. It is important to make sure that you are still able to take your medicine while on trips. Keep all follow-up visits as told by your health care provider. This is important.  Contact a health care provider if: You missed a dose of your blood thinner medicine.  Get help right away if: You have:  New or increased pain, swelling, warmth, or redness in an arm or leg. Numbness or tingling in an arm or leg. Shortness of breath during activity or at rest. A fever. Chest pain. A rapid or irregular heartbeat. A severe headache. Vision changes. A serious fall or accident, or you hit your head. Stomach (abdominal) pain. Blood in your vomit, stool, or urine. A cut that will not stop bleeding. You cough up blood. You feel light-headed or dizzy. You cannot move your arms or legs. You are  confused or have memory loss.  These symptoms may represent a serious problem that is an emergency. Do not wait to see if the symptoms will go away. Get medical help right away. Call your local emergency services (911 in the [place] Do not drive yourself to the hospital. Summary A pulmonary embolism (PE) is a sudden blockage or decrease of blood flow in one or both lungs. PE is a dangerous and life-threatening condition that needs to be treated right away. Treatments for this condition usually include medicines to thin your blood (anticoagulants) or medicines to break apart blood clots (thrombolytics). If you are given blood thinners, it is important to take the medicine every day at the same time each day. Understand what foods and drugs interact with any medicines that you are taking. If you have signs of PE or DVT, call your local emergency services (911 in the [place] This information is not intended to replace advice given to you by your health care provider. Make sure you discuss any questions you have with your health care provider. Document Revised: 09/25/2017 Document Reviewed: 09/25/2017 Elsevier Patient Education  2020 [employer].

## 2022-01-07 DIAGNOSIS — I25119 Atherosclerotic heart disease of native coronary artery with unspecified angina pectoris: Secondary | ICD-10-CM | POA: Diagnosis not present

## 2022-01-07 DIAGNOSIS — I10 Essential (primary) hypertension: Secondary | ICD-10-CM | POA: Diagnosis not present

## 2022-01-07 DIAGNOSIS — I5023 Acute on chronic systolic (congestive) heart failure: Secondary | ICD-10-CM | POA: Diagnosis not present

## 2022-01-07 LAB — BASIC METABOLIC PANEL
Anion gap: 9 (ref 5–15)
BUN: 17 mg/dL (ref 6–20)
CO2: 27 mmol/L (ref 22–32)
Calcium: 8.2 mg/dL — ABNORMAL LOW (ref 8.9–10.3)
Chloride: 98 mmol/L (ref 98–111)
Creatinine, Ser: 1.26 mg/dL — ABNORMAL HIGH (ref 0.61–1.24)
GFR, Estimated: >60 mL/min (ref >60)
Glucose, Bld: 110 mg/dL — ABNORMAL HIGH (ref 70–99)
Potassium: 3.4 mmol/L — ABNORMAL LOW (ref 3.5–5.1)
Sodium: 134 mmol/L — ABNORMAL LOW (ref 135–145)

## 2022-01-07 LAB — GLUCOSE, CAPILLARY: Glucose-Capillary: 90 mg/dL (ref 70–99)

## 2022-01-07 LAB — CBC
HCT: 30.3 % — ABNORMAL LOW (ref 39.0–52.0)
Hemoglobin: 9.1 g/dL — ABNORMAL LOW (ref 13.0–17.0)
MCH: 20.9 pg — ABNORMAL LOW (ref 26.0–34.0)
MCHC: 30 g/dL (ref 30.0–36.0)
MCV: 69.7 fL — ABNORMAL LOW (ref 80.0–100.0)
Platelets: 279 10*3/uL (ref 150–400)
RBC: 4.35 MIL/uL (ref 4.22–5.81)
RDW: 17.9 % — ABNORMAL HIGH (ref 11.5–15.5)
WBC: 12.7 10*3/uL — ABNORMAL HIGH (ref 4.0–10.5)
nRBC: 0 % (ref 0.0–0.2)

## 2022-01-07 LAB — C-REACTIVE PROTEIN: CRP: 11.4 mg/dL — ABNORMAL HIGH (ref <1.0)

## 2022-01-07 LAB — HEPARIN LEVEL (UNFRACTIONATED): Heparin Unfractionated: 0.42 IU/mL (ref 0.30–0.70)

## 2022-01-07 LAB — FERRITIN: Ferritin: 61 ng/mL (ref 24–336)

## 2022-01-07 MED ORDER — FUROSEMIDE 10 MG/ML IJ SOLN
60.0000 mg | Freq: Two times a day (BID) | INTRAMUSCULAR | Status: DC
  Administered 2022-01-07: 60 mg via INTRAVENOUS
  Filled 2022-01-07: qty 6

## 2022-01-07 MED ORDER — POTASSIUM CHLORIDE CRYS ER 20 MEQ PO TBCR
40.0000 meq | EXTENDED_RELEASE_TABLET | Freq: Two times a day (BID) | ORAL | Status: AC
  Administered 2022-01-07 (×2): 40 meq via ORAL
  Filled 2022-01-07 (×2): qty 2

## 2022-01-07 NOTE — Progress Notes (Addendum)
PROGRESS NOTE    Russell Baldwin  HCW:[id number] DOB: 11-Oct-1970 DOA: 01/05/2022 PCP: Pcp, No  52 y.o. male with a hx of essential hypertension, HLD, ischemic cardiomyopathy, coronary artery disease status post STEMI with PCI and DES to his proximal RCA in 2016. EF 40-45%, G2 DD, mild mitral valve regurgitation, had a cath 9/22 which noted single-vessel CAD, 100% stenosis of mid RCA with collaterals that was managed medically presented to the ED with worsening shortness of breath, abdominal wall tightness and leg swelling for 3 to 4 days, history of PND and orthopnea and some pleuritic chest pain with cough. -He has been off his medications for a few months, has not seen a cardiologist last year In the ED hypertensive blood pressure 174/131, chest x-ray with pulmonary edema, creatinine 1.2, BNP 2539, troponin 163, COVID-positive -1/6, CTA chest positive for bilateral pulmonary embolism, pulmonary infarct -Echo with drop in EF to 20-25% with severe RV dysfunction  Subjective: -Feels better, mild chest tightness  Assessment and Plan:  Acute on chronic systolic CHF -Known ischemic cardiomyopathy with EF of 40%, repeat echo with EF down to 20-25%, severely reduced RV function and severe LVH -Recent poor compliance with diuretics and follow-up -Urine output inaccurate in the ED, weight down 5lbs, continue IV Lasix today -Continue Farxiga, Aldactone, Imdur hydralazine -Cardiology following, needs ischemic eval -Needs TOC follow-up  CAD -Prior RCA stent 2016 -Cath 9/22 with 100% mid RCA stenosis with collaterals, managed medically -Continue aspirin, Plavix, statin, beta-blocker -Cards consulting, see discussion above, significant drop in EF  Acute bilateral PE -Could be in the setting of COVID, patient reports Covid Symptoms over a month ago -Continue heparin, transition to oral Eliquis after ischemic workup completed  SARS COVID-19 infection -No recent symptoms, patient reportedly had  symptoms in late November -Monitor clinically, airborne precautions X 10 days, continue Paxlovid day 3 -No hypoxia, CT chest without evidence of COVID-pneumonia  Essential hypertension -Imdur, Coreg, Aldactone, Lasix   DVT prophylaxis: IV heparin Code Status: Full code Family Communication: None present Disposition Plan: Home likely 2 to 3 days  Consultants: Cardiology   Procedures:   Antimicrobials:    Objective: Vitals:   01/06/22 2055 01/07/22 0045 01/07/22 0605 01/07/22 0747  BP: 117/83 121/79 (!) 136/99 (!) 139/106  Pulse: 77 74 78 80  Resp:   19 17  Temp: 98.1 F (36.7 C) 97.6 F (36.4 C) 98 F (36.7 C) 99 F (37.2 C)  TempSrc: Oral Oral Oral Oral  SpO2: 95% 99% 97% 100%  Weight:   79.6 kg   Height:        Intake/Output Summary (Last 24 hours) at 01/07/2022 1127 Last data filed at 01/07/2022 0600 Gross per 24 hour  Intake 1687.6 ml  Output 1500 ml  Net 187.6 ml   Filed Weights   01/06/22 0800 01/06/22 1654 01/07/22 0605  Weight: 81.6 kg 81 kg 79.6 kg    Examination:  General exam: Pleasant young male sitting up in bed, AAOx3, no distress HEENT: Positive JVD CVS: S1-S2, regular rhythm Lungs: Few rales Abdomen: Soft, nontender, bowel sounds present Extremities: 2+ edema  Skin: No rashes Psychiatry:  Mood & affect appropriate.     Data Reviewed:   CBC: Recent Labs  Lab 01/05/22 2116 01/06/22 0220 01/06/22 0326 01/07/22 0039  WBC 8.4 9.5 10.7* 12.7*  NEUTROABS 7.1  --   --   --   HGB 10.6* 10.5* 10.5* 9.1*  HCT 35.5* 34.8* 34.9* 30.3*  MCV 70.2* 70.3* 69.8* 69.7*  PLT  318 286 300 279   Basic Metabolic Panel: Recent Labs  Lab 01/05/22 2116 01/06/22 0220 01/06/22 0326 01/07/22 0039  NA 135  --  135 134*  K 3.7  --  3.6 3.4*  CL 102  --  99 98  CO2 23  --  27 27  GLUCOSE 112*  --  141* 110*  BUN 13  --  13 17  CREATININE 1.29* 1.30* 1.36* 1.26*  CALCIUM 8.6*  --  8.6* 8.2*   GFR: Estimated Creatinine Clearance: 72.4 mL/min (A)  (by C-G formula based on SCr of 1.26 mg/dL (H)). Liver Function Tests: Recent Labs  Lab 01/05/22 2116 01/06/22 0326  AST 60* 54*  ALT 88* 86*  ALKPHOS 54 56  BILITOT 1.6* 1.6*  PROT 6.2* 6.3*  ALBUMIN 3.2* 3.2*   No results for input(s): "LIPASE", "AMYLASE" in the last 168 hours. No results for input(s): "AMMONIA" in the last 168 hours. Coagulation Profile: Recent Labs  Lab 01/06/22 0326  INR 1.3*   Cardiac Enzymes: No results for input(s): "CKTOTAL", "CKMB", "CKMBINDEX", "TROPONINI" in the last 168 hours. BNP (last 3 results) No results for input(s): "PROBNP" in the last 8760 hours. HbA1C: No results for input(s): "HGBA1C" in the last 72 hours. CBG: Recent Labs  Lab 01/07/22 0744  GLUCAP 90   Lipid Profile: No results for input(s): "CHOL", "HDL", "LDLCALC", "TRIG", "CHOLHDL", "LDLDIRECT" in the last 72 hours. Thyroid Function Tests: Recent Labs    01/06/22 0220  TSH 1.662   Anemia Panel: Recent Labs    01/06/22 0220 01/07/22 0039  FERRITIN 30 61   Urine analysis:    Component Value Date/Time   COLORURINE YELLOW 05/23/2010 1009   APPEARANCEUR CLEAR 05/23/2010 1009   LABSPEC >=1.030 08/27/2012 1820   PHURINE 6.0 08/27/2012 1820   GLUCOSEU NEGATIVE 08/27/2012 1820   HGBUR NEGATIVE 08/27/2012 1820   BILIRUBINUR NEGATIVE 08/27/2012 1820   KETONESUR NEGATIVE 08/27/2012 1820   PROTEINUR 30 (A) 08/27/2012 1820   UROBILINOGEN 0.2 08/27/2012 1820   NITRITE NEGATIVE 08/27/2012 1820   LEUKOCYTESUR NEGATIVE 08/27/2012 1820   Sepsis Labs: @LABRCNTIP (procalcitonin:4,lacticidven:4)  ) Recent Results (from the past 240 hour(s))  Resp panel by RT-PCR (RSV, Flu A&B, Covid) Anterior Nasal Swab     Status: Abnormal   Collection Time: 01/05/22  9:11 PM   Specimen: Anterior Nasal Swab  Result Value Ref Range Status   SARS Coronavirus 2 by RT PCR POSITIVE (A) NEGATIVE Final    Comment: (NOTE) SARS-CoV-2 target nucleic acids are DETECTED.  The SARS-CoV-2 RNA is  generally detectable in upper respiratory specimens during the acute phase of infection. Positive results are indicative of the presence of the identified virus, but do not rule out bacterial infection or co-infection with other pathogens not detected by the test. Clinical correlation with patient history and other diagnostic information is necessary to determine patient infection status. The expected result is Negative.  Fact Sheet for Patients: 03/06/22  Fact Sheet for Healthcare Providers: BloggerCourse.com  This test is not yet approved or cleared by the [place] FDA and  has been authorized for detection and/or diagnosis of SARS-CoV-2 by FDA under an Emergency Use Authorization (EUA).  This EUA will remain in effect (meaning this test can be used) for the duration of  the COVID-19 declaration under Section 564(b)(1) of the A ct, 21 U.S.C. section 360bbb-3(b)(1), unless the authorization is terminated or revoked sooner.     Influenza A by PCR NEGATIVE NEGATIVE Final   Influenza B by  PCR NEGATIVE NEGATIVE Final    Comment: (NOTE) The Xpert Xpress SARS-CoV-2/FLU/RSV plus assay is intended as an aid in the diagnosis of influenza from Nasopharyngeal swab specimens and should not be used as a sole basis for treatment. Nasal washings and aspirates are unacceptable for Xpert Xpress SARS-CoV-2/FLU/RSV testing.  Fact Sheet for Patients: BloggerCourse.com  Fact Sheet for Healthcare Providers: [employer]ious[employer]ker[employer]it  This test is not yet approved or cleared by the [place] FDA and has been authorized for detection and/or diagnosis of SARS-CoV-2 by FDA under an Emergency Use Authorization (EUA). This EUA will remain in effect (meaning this test can be used) for the duration of the COVID-19 declaration under Section 564(b)(1) of the Act, 21 U.S.C. section 360bbb-3(b)(1),  unless the authorization is terminated or revoked.     Resp Syncytial Virus by PCR NEGATIVE NEGATIVE Final    Comment: (NOTE) Fact Sheet for Patients: BloggerCourse.com  Fact Sheet for Healthcare Providers: [employer]it  This test is not yet approved or cleared by the [place] FDA and has been authorized for detection and/or diagnosis of SARS-CoV-2 by FDA under an Emergency Use Authorization (EUA). This EUA will remain in effect (meaning this test can be used) for the duration of the COVID-19 declaration under Section 564(b)(1) of the Act, 21 U.S.C. section 360bbb-3(b)(1), unless the authorization is terminated or revoked.  Performed at [facility], [address] [place], [place] [address]      Radiology Studies: VAS [place] LOWER EXTREMITY VENOUS (DVT)  Result Date: 01/07/2022  Lower Venous DVT Study Patient Name:  GARVIS DOWNUM  Date of Exam:   01/06/2022 Medical Rec #: [id number]       Accession #:    [id number] Date of Birth: [birth year]       Patient Gender: M Patient Age:   53 years Exam Location:  [facility] Procedure:      VAS [place] LOWER EXTREMITY VENOUS (DVT) Referring Phys: Skip Mayer --------------------------------------------------------------------------------  Indications: Pulmonary embolism. Covid-19. Elevated d-dimer.  Limitations: Poor ultrasound/tissue interface. Performing Technologist: Jean Rosenthal RDMS, RVT  Examination Guidelines: A complete evaluation includes B-mode imaging, spectral Doppler, color Doppler, and power Doppler as needed of all accessible portions of each vessel. Bilateral testing is considered an integral part of a complete examination. Limited examinations for reoccurring indications may be performed as noted. The reflux portion of the exam is performed with the patient in reverse Trendelenburg.  +---------+---------------+---------+-----------+----------+--------------+ RIGHT     CompressibilityPhasicitySpontaneityPropertiesThrombus Aging +---------+---------------+---------+-----------+----------+--------------+ CFV      Full           No       Yes                                 +---------+---------------+---------+-----------+----------+--------------+ SFJ      Full                                                        +---------+---------------+---------+-----------+----------+--------------+ FV Prox  Full                                                        +---------+---------------+---------+-----------+----------+--------------+  FV Mid   Full                                                        +---------+---------------+---------+-----------+----------+--------------+ FV DistalFull                                                        +---------+---------------+---------+-----------+----------+--------------+ PFV      Full                                                        +---------+---------------+---------+-----------+----------+--------------+ POP      Full           No       Yes                                 +---------+---------------+---------+-----------+----------+--------------+ PTV      Full                                                        +---------+---------------+---------+-----------+----------+--------------+ PERO     Full                                                        +---------+---------------+---------+-----------+----------+--------------+   +---------+---------------+---------+-----------+----------+--------------+ LEFT     CompressibilityPhasicitySpontaneityPropertiesThrombus Aging +---------+---------------+---------+-----------+----------+--------------+ CFV      Full           No       Yes                                 +---------+---------------+---------+-----------+----------+--------------+ SFJ      Full                                                         +---------+---------------+---------+-----------+----------+--------------+ FV Prox  Full                                                        +---------+---------------+---------+-----------+----------+--------------+ FV Mid   Full                                                        +---------+---------------+---------+-----------+----------+--------------+  FV DistalFull                                                        +---------+---------------+---------+-----------+----------+--------------+ PFV      Full                                                        +---------+---------------+---------+-----------+----------+--------------+ POP      Full           No       Yes                                 +---------+---------------+---------+-----------+----------+--------------+ PTV      Full                                                        +---------+---------------+---------+-----------+----------+--------------+ PERO     Full                                                        +---------+---------------+---------+-----------+----------+--------------+     Summary: RIGHT: - There is no evidence of deep vein thrombosis in the lower extremity.  - No cystic structure found in the popliteal fossa.  LEFT: - There is no evidence of deep vein thrombosis in the lower extremity.  - No cystic structure found in the popliteal fossa.  *See table(s) above for measurements and observations. Electronically signed by Orlie Pollen on 01/07/2022 at 7:54:27 AM.    Final    ECHOCARDIOGRAM COMPLETE  Result Date: 01/06/2022    ECHOCARDIOGRAM REPORT   Patient Name:   LAMARR FEENSTRA Date of Exam: 01/06/2022 Medical Rec #:  [id number]      Height:       70.0 in Accession #:    [id number]     Weight:       180.0 lb Date of Birth:  1970/04/19      BSA:          1.996 m Patient Age:    34 years       BP:           134/103 mmHg Patient Gender: M               HR:           86 bpm. Exam Location:  Inpatient Procedure: 2D Echo and Intracardiac Opacification Agent Indications:    CHF  History:        Patient has prior history of Echocardiogram examinations, most                 recent 09/19/2020. CHF, CAD; Risk Factors:Hypertension and                 Dyslipidemia.  Sonographer:  Cathie HoopsWendy Porter Referring Phys: [id number] SARA-MAIZ A THOMAS IMPRESSIONS  1. Left ventricular ejection fraction, by estimation, is 20 to 25%. The left ventricle has severely decreased function. The left ventricle demonstrates global hypokinesis. The left ventricular internal cavity size was mildly to moderately dilated. Moderate to severe left ventricular hypertrophy. Left ventricular diastolic function could not be evaluated.  2. Right ventricular systolic function is severely reduced. The right ventricular size is moderately enlarged. There is mildly elevated pulmonary artery systolic pressure. The estimated right ventricular systolic pressure is 37.3 mmHg.  3. Left atrial size was mildly dilated.  4. Right atrial size was mild to moderately dilated.  5. The mitral valve is normal in structure. Trivial mitral valve regurgitation. No evidence of mitral stenosis.  6. The aortic valve is tricuspid. Aortic valve regurgitation is not visualized. No aortic stenosis is present.  7. The inferior vena cava is dilated in size with <50% respiratory variability, suggesting right atrial pressure of 15 mmHg. Comparison(s): Changes from prior study are noted. LVEF worsened to 20-25% now compared to 40-45% from 2022. FINDINGS  Left Ventricle: Left ventricular ejection fraction, by estimation, is 20 to 25%. The left ventricle has severely decreased function. The left ventricle demonstrates global hypokinesis. Definity contrast agent was given IV to delineate the left ventricular endocardial borders. The left ventricular internal cavity size was mildly to moderately dilated. Moderate to severe left ventricular  hypertrophy. Left ventricular diastolic function could not be evaluated due to nondiagnostic images. Left ventricular diastolic function could not be evaluated. Right Ventricle: The right ventricular size is moderately enlarged. No increase in right ventricular wall thickness. Right ventricular systolic function is severely reduced. There is mildly elevated pulmonary artery systolic pressure. The tricuspid regurgitant velocity is 2.36 m/s, and with an assumed right atrial pressure of 15 mmHg, the estimated right ventricular systolic pressure is 37.3 mmHg. Left Atrium: Left atrial size was mildly dilated. Right Atrium: Right atrial size was mild to moderately dilated. Pericardium: Trivial pericardial effusion is present. Mitral Valve: The mitral valve is normal in structure. Trivial mitral valve regurgitation. No evidence of mitral valve stenosis. Tricuspid Valve: The tricuspid valve is normal in structure. Tricuspid valve regurgitation is trivial. No evidence of tricuspid stenosis. Aortic Valve: The aortic valve is tricuspid. Aortic valve regurgitation is not visualized. No aortic stenosis is present. Aortic valve mean gradient measures 3.7 mmHg. Aortic valve peak gradient measures 6.9 mmHg. Aortic valve area, by VTI measures 1.35 cm. Pulmonic Valve: The pulmonic valve was normal in structure. Pulmonic valve regurgitation is mild to moderate. No evidence of pulmonic stenosis. Aorta: The aortic root and ascending aorta are structurally normal, with no evidence of dilitation. Venous: The inferior vena cava is dilated in size with less than 50% respiratory variability, suggesting right atrial pressure of 15 mmHg. IAS/Shunts: No atrial level shunt detected by color flow Doppler.  LEFT VENTRICLE PLAX 2D LVIDd:         5.80 cm      Diastology LVIDs:         4.90 cm      LV e' medial:  3.70 cm/s LV PW:         1.50 cm      LV e' lateral: 9.57 cm/s LV IVS:        1.50 cm LVOT diam:     2.00 cm LV SV:         31 LV SV  Index:   15 LVOT Area:     3.14 cm  LV Volumes (MOD) LV vol d, MOD A2C: 282.5 ml LV vol d, MOD A4C: 260.5 ml LV vol s, MOD A2C: 219.5 ml LV vol s, MOD A4C: 176.0 ml LV SV MOD A2C:     63.0 ml LV SV MOD A4C:     260.5 ml LV SV MOD BP:      77.2 ml RIGHT VENTRICLE RV Basal diam:  4.70 cm RV Mid diam:    3.90 cm RV S prime:     8.38 cm/s TAPSE (M-mode): 1.0 cm LEFT ATRIUM             Index        RIGHT ATRIUM           Index LA diam:        4.00 cm 2.00 cm/m   RA Area:     22.30 cm LA Vol (A2C):   88.7 ml 44.44 ml/m  RA Volume:   70.10 ml  35.12 ml/m LA Vol (A4C):   58.3 ml 29.21 ml/m LA Biplane Vol: 71.4 ml 35.77 ml/m  AORTIC VALVE                    PULMONIC VALVE AV Area (Vmax):    1.87 cm     PV Vmax:          1.07 m/s AV Area (Vmean):   1.67 cm     PV Peak grad:     4.6 mmHg AV Area (VTI):     1.35 cm     PR End Diast Vel: 3.51 msec AV Vmax:           131.00 cm/s AV Vmean:          86.967 cm/s AV VTI:            0.229 m AV Peak Grad:      6.9 mmHg AV Mean Grad:      3.7 mmHg LVOT Vmax:         78.10 cm/s LVOT Vmean:        46.100 cm/s LVOT VTI:          0.098 m LVOT/AV VTI ratio: 0.43  AORTA Ao Root diam: 3.20 cm Ao Asc diam:  3.60 cm MR Peak grad: 28.6 mmHg   TRICUSPID VALVE MR Vmax:      267.50 cm/s TR Peak grad:   22.3 mmHg                           TR Vmax:        236.00 cm/s                            SHUNTS                           Systemic VTI:  0.10 m                           Systemic Diam: 2.00 cm Vishnu Priya Mallipeddi Electronically signed by Winfield RastVishnu Priya Mallipeddi Signature Date/Time: 01/06/2022/1:15:15 PM    Final    CT Angio Chest Pulmonary Embolism (PE) W or WO Contrast  Result Date: 01/06/2022 CLINICAL DATA:  Shortness of breath with pulmonary embolism suspected EXAM: CT ANGIOGRAPHY CHEST WITH CONTRAST TECHNIQUE: Multidetector CT imaging of the chest was performed using the standard  protocol during bolus administration of intravenous contrast. Multiplanar CT image reconstructions and  MIPs were obtained to evaluate the vascular anatomy. RADIATION DOSE REDUCTION: This exam was performed according to the departmental dose-optimization program which includes automated exposure control, adjustment of the mA and/or kV according to patient size and/or use of iterative reconstruction technique. CONTRAST:  68mL OMNIPAQUE IOHEXOL 350 MG/ML SOLN COMPARISON:  None Available. FINDINGS: Cardiovascular: Satisfactory opacification of the pulmonary arteries. Posterior segment pulmonary embolism to the left upper lobe. Non filling of right lower lobe subsegmental branches where there are changes of pulmonary infarct is well. Medial segmental right middle lobe pulmonary embolism with pulmonary infarct. Cannot accurately assess the RV to LV ratio given non opacification of the left ventricle, there is multi chamber cardiac enlargement at baseline is well. Non-opacified aorta Mediastinum/Nodes: No mass or adenopathy Lungs/Pleura: Ground-glass opacity in the right middle and lower lobes associated with emboli. Interlobular septal thickening diffusely with small right pleural effusion Upper Abdomen: Hepatic venous reflux attributed to elevated right heart pressures, possibly chronic given the cardiac size. Musculoskeletal: No acute finding Review of the MIP images confirms the above findings. Critical Value/emergent results were called by telephone at the time of interpretation on 01/06/2022 at 7:40 am to provider Dr. Jomarie Longs, who verbally acknowledged these results. IMPRESSION: 1. Positive for multifocal acute pulmonary emboli, segmental to subsegmental, with infarcts in the right middle and lower lobes. 2. Cardiomegaly with mild interstitial pulmonary edema and small right pleural effusion. Electronically Signed   By: Tiburcio Pea M.D.   On: 01/06/2022 07:40   DG Chest 2 View  Result Date: 01/05/2022 CLINICAL DATA:  Shortness of breath EXAM: CHEST - 2 VIEW COMPARISON:  09/19/2020 FINDINGS: Transverse diameter of  heart is increased. Central pulmonary vessels are more prominent. Increased interstitial markings are seen in parahilar regions and lower lung fields. Linear patchy densities are seen in right lower lung field. There is blunting of right lateral CP angle. There is no pneumothorax. IMPRESSION: Cardiomegaly. Central pulmonary vessels are more prominent suggesting CHF. Increased markings are seen in right lower lung fields suggesting atelectasis/pneumonia. Part of this finding may be due to asymmetric pulmonary edema. Small right pleural effusion is seen. Electronically Signed   By: Ernie Avena M.D.   On: 01/05/2022 21:27     Scheduled Meds:  aspirin  81 mg Oral Daily   atorvastatin  80 mg Oral q1800   carvedilol  12.5 mg Oral BID WC   clopidogrel  75 mg Oral Daily   dapagliflozin propanediol  10 mg Oral Daily   furosemide  60 mg Intravenous BID   isosorbide mononitrate  30 mg Oral Daily   nirmatrelvir/ritonavir  3 tablet Oral BID   potassium chloride  40 mEq Oral BID   spironolactone  25 mg Oral Daily   Continuous Infusions:  heparin 1,450 Units/hr (01/06/22 2241)     LOS: 1 day    Time spent:    Zannie Cove, MD [facility]   01/07/2022, 11:27 AM

## 2022-01-07 NOTE — Progress Notes (Signed)
ANTICOAGULATION CONSULT NOTE  Pharmacy Consult for heparin Indication: pulmonary embolus  Heparin Dosing Weight: 81.6 kg  Labs: Recent Labs    01/06/22 0220 01/06/22 0326 01/06/22 1232 01/06/22 1620 01/07/22 0039  HGB 10.5* 10.5*  --   --  9.1*  HCT 34.8* 34.9*  --   --  30.3*  PLT 286 300  --   --  279  LABPROT  --  16.3*  --   --   --   INR  --  1.3*  --   --   --   HEPARINUNFRC  --   --  0.54 0.58 0.42  CREATININE 1.30* 1.36*  --   --  1.26*     Assessment: 74 yom with hx CAD s/p inferior wall STEMI with DES to RCA in 2012 presenting with SOB/CP, leg swelling. Noted Covid-19+, unclear timing of infection. Patient is not on anticoagulation PTA and has reportedly been noncompliant with his home medications for a year. D-dimer elevated, and CTA positive for acute multifocal PE. Pharmacy consulted to dose heparin.  Heparin level today is therapeutic at 0.42, on 1450 units/hr. Hgb 9.1, plt 279--stable. No line issues or signs/symptoms of bleeding reported.  Goal of Therapy:  Heparin level 0.3-0.7 units/ml Monitor platelets by anticoagulation protocol: Yes   Plan:  Continue heparin at current rate 1450 units/hr  Monitor heparin level, CBC, s/sx bleeding daily  F/u long-term plan for anticoagulation   Billey Gosling, PharmD PGY1 Pharmacy Resident 1/7/20247:11 AM

## 2022-01-07 NOTE — Progress Notes (Addendum)
Progress Note  Patient Name: Russell Baldwin Date of Encounter: 01/07/2022  Primary Cardiologist: Evalina Field, MD  Subjective   No acute events overnight. Denied having symptoms except for chest tightness.  Inpatient Medications    Scheduled Meds:  aspirin  81 mg Oral Daily   atorvastatin  80 mg Oral q1800   carvedilol  12.5 mg Oral BID WC   clopidogrel  75 mg Oral Daily   dapagliflozin propanediol  10 mg Oral Daily   furosemide  40 mg Intravenous BID   isosorbide mononitrate  30 mg Oral Daily   nirmatrelvir/ritonavir  3 tablet Oral BID   potassium chloride  40 mEq Oral BID   spironolactone  25 mg Oral Daily   Continuous Infusions:  heparin 1,450 Units/hr (01/06/22 2241)   PRN Meds: acetaminophen **OR** acetaminophen, albuterol, chlorpheniramine-HYDROcodone, fentaNYL (SUBLIMAZE) injection, guaiFENesin-dextromethorphan, hydrALAZINE, nitroGLYCERIN, ondansetron **OR** ondansetron (ZOFRAN) IV, mouth rinse   Vital Signs    Vitals:   01/06/22 2055 01/07/22 0045 01/07/22 0605 01/07/22 0747  BP: 117/83 121/79 (!) 136/99 (!) 139/106  Pulse: 77 74 78 80  Resp:   19 17  Temp: 98.1 F (36.7 C) 97.6 F (36.4 C) 98 F (36.7 C) 99 F (37.2 C)  TempSrc: Oral Oral Oral Oral  SpO2: 95% 99% 97% 100%  Weight:   79.6 kg   Height:        Intake/Output Summary (Last 24 hours) at 01/07/2022 1024 Last data filed at 01/07/2022 0600 Gross per 24 hour  Intake 1687.6 ml  Output 1500 ml  Net 187.6 ml   Filed Weights   01/06/22 0800 01/06/22 1654 01/07/22 0605  Weight: 81.6 kg 81 kg 79.6 kg    Telemetry     Personally reviewed, HR controlled  ECG   NSR and LVH with repol  Physical Exam   GEN: No acute distress.   Neck:  JVD +. Cardiac: RRR, no murmur, rub, or gallop.  Respiratory: Nonlabored. Clear to auscultation bilaterally. GI: Soft, nontender, bowel sounds present. MS: 1+ pitting edema; No deformity. Neuro:  Nonfocal. Psych: Alert and oriented x 3. Normal  affect.  Labs    Chemistry Recent Labs  Lab 01/05/22 2116 01/06/22 0220 01/06/22 0326 01/07/22 0039  NA 135  --  135 134*  K 3.7  --  3.6 3.4*  CL 102  --  99 98  CO2 23  --  27 27  GLUCOSE 112*  --  141* 110*  BUN 13  --  13 17  CREATININE 1.29* 1.30* 1.36* 1.26*  CALCIUM 8.6*  --  8.6* 8.2*  PROT 6.2*  --  6.3*  --   ALBUMIN 3.2*  --  3.2*  --   AST 60*  --  54*  --   ALT 88*  --  86*  --   ALKPHOS 54  --  56  --   BILITOT 1.6*  --  1.6*  --   GFRNONAA >60 >60 >60 >60  ANIONGAP 10  --  9 9     Hematology Recent Labs  Lab 01/06/22 0220 01/06/22 0326 01/07/22 0039  WBC 9.5 10.7* 12.7*  RBC 4.95 5.00 4.35  HGB 10.5* 10.5* 9.1*  HCT 34.8* 34.9* 30.3*  MCV 70.3* 69.8* 69.7*  MCH 21.2* 21.0* 20.9*  MCHC 30.2 30.1 30.0  RDW 18.2* 18.1* 17.9*  PLT 286 300 279    Cardiac Enzymes Recent Labs  Lab 01/05/22 2116 01/05/22 2352  TROPONINIHS 163* 164*    BNP  Recent Labs  Lab 01/05/22 2116  BNP 2,538.5*     DDimer Recent Labs  Lab 01/06/22 0220  DDIMER 4.68*     Radiology    VAS [place] LOWER EXTREMITY VENOUS (DVT)  Result Date: 01/07/2022  Lower Venous DVT Study Patient Name:  MAXENCE LAPRAIRIE  Date of Exam:   01/06/2022 Medical Rec #: [id number]       Accession #:    [id number] Date of Birth: 24-Dec-1970       Patient Gender: M Patient Age:   52 years Exam Location:  [facility] Procedure:      VAS [place] LOWER EXTREMITY VENOUS (DVT) Referring Phys: Myles Rosenthal --------------------------------------------------------------------------------  Indications: Pulmonary embolism. Covid-19. Elevated d-dimer.  Limitations: Poor ultrasound/tissue interface. Performing Technologist: Darlin Coco RDMS, RVT  Examination Guidelines: A complete evaluation includes B-mode imaging, spectral Doppler, color Doppler, and power Doppler as needed of all accessible portions of each vessel. Bilateral testing is considered an integral part of a complete examination. Limited  examinations for reoccurring indications may be performed as noted. The reflux portion of the exam is performed with the patient in reverse Trendelenburg.  +---------+---------------+---------+-----------+----------+--------------+ RIGHT    CompressibilityPhasicitySpontaneityPropertiesThrombus Aging +---------+---------------+---------+-----------+----------+--------------+ CFV      Full           No       Yes                                 +---------+---------------+---------+-----------+----------+--------------+ SFJ      Full                                                        +---------+---------------+---------+-----------+----------+--------------+ FV Prox  Full                                                        +---------+---------------+---------+-----------+----------+--------------+ FV Mid   Full                                                        +---------+---------------+---------+-----------+----------+--------------+ FV DistalFull                                                        +---------+---------------+---------+-----------+----------+--------------+ PFV      Full                                                        +---------+---------------+---------+-----------+----------+--------------+ POP      Full           No       Yes                                 +---------+---------------+---------+-----------+----------+--------------+  PTV      Full                                                        +---------+---------------+---------+-----------+----------+--------------+ PERO     Full                                                        +---------+---------------+---------+-----------+----------+--------------+   +---------+---------------+---------+-----------+----------+--------------+ LEFT     CompressibilityPhasicitySpontaneityPropertiesThrombus Aging  +---------+---------------+---------+-----------+----------+--------------+ CFV      Full           No       Yes                                 +---------+---------------+---------+-----------+----------+--------------+ SFJ      Full                                                        +---------+---------------+---------+-----------+----------+--------------+ FV Prox  Full                                                        +---------+---------------+---------+-----------+----------+--------------+ FV Mid   Full                                                        +---------+---------------+---------+-----------+----------+--------------+ FV DistalFull                                                        +---------+---------------+---------+-----------+----------+--------------+ PFV      Full                                                        +---------+---------------+---------+-----------+----------+--------------+ POP      Full           No       Yes                                 +---------+---------------+---------+-----------+----------+--------------+ PTV      Full                                                        +---------+---------------+---------+-----------+----------+--------------+  PERO     Full                                                        +---------+---------------+---------+-----------+----------+--------------+     Summary: RIGHT: - There is no evidence of deep vein thrombosis in the lower extremity.  - No cystic structure found in the popliteal fossa.  LEFT: - There is no evidence of deep vein thrombosis in the lower extremity.  - No cystic structure found in the popliteal fossa.  *See table(s) above for measurements and observations. Electronically signed by Orlie Pollen on 01/07/2022 at 7:54:27 AM.    Final    ECHOCARDIOGRAM COMPLETE  Result Date: 01/06/2022    ECHOCARDIOGRAM REPORT   Patient Name:    VARAD TITMAN Date of Exam: 01/06/2022 Medical Rec #:  [id number]      Height:       70.0 in Accession #:    [id number]     Weight:       180.0 lb Date of Birth:  1970/09/24      BSA:          1.996 m Patient Age:    52 years       BP:           134/103 mmHg Patient Gender: M              HR:           86 bpm. Exam Location:  Inpatient Procedure: 2D Echo and Intracardiac Opacification Agent Indications:    CHF  History:        Patient has prior history of Echocardiogram examinations, most                 recent 09/19/2020. CHF, CAD; Risk Factors:Hypertension and                 Dyslipidemia.  Sonographer:    Harvie Junior Referring Phys: [id number] [place]  1. Left ventricular ejection fraction, by estimation, is 20 to 25%. The left ventricle has severely decreased function. The left ventricle demonstrates global hypokinesis. The left ventricular internal cavity size was mildly to moderately dilated. Moderate to severe left ventricular hypertrophy. Left ventricular diastolic function could not be evaluated.  2. Right ventricular systolic function is severely reduced. The right ventricular size is moderately enlarged. There is mildly elevated pulmonary artery systolic pressure. The estimated right ventricular systolic pressure is 123XX123 mmHg.  3. Left atrial size was mildly dilated.  4. Right atrial size was mild to moderately dilated.  5. The mitral valve is normal in structure. Trivial mitral valve regurgitation. No evidence of mitral stenosis.  6. The aortic valve is tricuspid. Aortic valve regurgitation is not visualized. No aortic stenosis is present.  7. The inferior vena cava is dilated in size with <50% respiratory variability, suggesting right atrial pressure of 15 mmHg. Comparison(s): Changes from prior study are noted. LVEF worsened to 20-25% now compared to 40-45% from 2022. FINDINGS  Left Ventricle: Left ventricular ejection fraction, by estimation, is 20 to 25%. The left ventricle has  severely decreased function. The left ventricle demonstrates global hypokinesis. Definity contrast agent was given IV to delineate the left ventricular endocardial borders. The left ventricular internal cavity size was mildly to moderately dilated. Moderate to severe left ventricular hypertrophy.  Left ventricular diastolic function could not be evaluated due to nondiagnostic images. Left ventricular diastolic function could not be evaluated. Right Ventricle: The right ventricular size is moderately enlarged. No increase in right ventricular wall thickness. Right ventricular systolic function is severely reduced. There is mildly elevated pulmonary artery systolic pressure. The tricuspid regurgitant velocity is 2.36 m/s, and with an assumed right atrial pressure of 15 mmHg, the estimated right ventricular systolic pressure is 123XX123 mmHg. Left Atrium: Left atrial size was mildly dilated. Right Atrium: Right atrial size was mild to moderately dilated. Pericardium: Trivial pericardial effusion is present. Mitral Valve: The mitral valve is normal in structure. Trivial mitral valve regurgitation. No evidence of mitral valve stenosis. Tricuspid Valve: The tricuspid valve is normal in structure. Tricuspid valve regurgitation is trivial. No evidence of tricuspid stenosis. Aortic Valve: The aortic valve is tricuspid. Aortic valve regurgitation is not visualized. No aortic stenosis is present. Aortic valve mean gradient measures 3.7 mmHg. Aortic valve peak gradient measures 6.9 mmHg. Aortic valve area, by VTI measures 1.35 cm. Pulmonic Valve: The pulmonic valve was normal in structure. Pulmonic valve regurgitation is mild to moderate. No evidence of pulmonic stenosis. Aorta: The aortic root and ascending aorta are structurally normal, with no evidence of dilitation. Venous: The inferior vena cava is dilated in size with less than 50% respiratory variability, suggesting right atrial pressure of 15 mmHg. IAS/Shunts: No atrial  level shunt detected by color flow Doppler.  LEFT VENTRICLE PLAX 2D LVIDd:         5.80 cm      Diastology LVIDs:         4.90 cm      LV e' medial:  3.70 cm/s LV PW:         1.50 cm      LV e' lateral: 9.57 cm/s LV IVS:        1.50 cm LVOT diam:     2.00 cm LV SV:         31 LV SV Index:   15 LVOT Area:     3.14 cm  LV Volumes (MOD) LV vol d, MOD A2C: 282.5 ml LV vol d, MOD A4C: 260.5 ml LV vol s, MOD A2C: 219.5 ml LV vol s, MOD A4C: 176.0 ml LV SV MOD A2C:     63.0 ml LV SV MOD A4C:     260.5 ml LV SV MOD BP:      77.2 ml RIGHT VENTRICLE RV Basal diam:  4.70 cm RV Mid diam:    3.90 cm RV S prime:     8.38 cm/s TAPSE (M-mode): 1.0 cm LEFT ATRIUM             Index        RIGHT ATRIUM           Index LA diam:        4.00 cm 2.00 cm/m   RA Area:     22.30 cm LA Vol (A2C):   88.7 ml 44.44 ml/m  RA Volume:   70.10 ml  35.12 ml/m LA Vol (A4C):   58.3 ml 29.21 ml/m LA Biplane Vol: 71.4 ml 35.77 ml/m  AORTIC VALVE                    PULMONIC VALVE AV Area (Vmax):    1.87 cm     PV Vmax:          1.07 m/s AV Area (Vmean):   1.67 cm  PV Peak grad:     4.6 mmHg AV Area (VTI):     1.35 cm     PR End Diast Vel: 3.51 msec AV Vmax:           131.00 cm/s AV Vmean:          86.967 cm/s AV VTI:            0.229 m AV Peak Grad:      6.9 mmHg AV Mean Grad:      3.7 mmHg LVOT Vmax:         78.10 cm/s LVOT Vmean:        46.100 cm/s LVOT VTI:          0.098 m LVOT/AV VTI ratio: 0.43  AORTA Ao Root diam: 3.20 cm Ao Asc diam:  3.60 cm MR Peak grad: 28.6 mmHg   TRICUSPID VALVE MR Vmax:      267.50 cm/s TR Peak grad:   22.3 mmHg                           TR Vmax:        236.00 cm/s                            SHUNTS                           Systemic VTI:  0.10 m                           Systemic Diam: 2.00 cm Tailor Westfall Priya Danzig Macgregor Electronically signed by Lorelee Cover Orris Perin Signature Date/Time: 01/06/2022/1:15:15 PM    Final    CT Angio Chest Pulmonary Embolism (PE) W or WO Contrast  Result Date: 01/06/2022 CLINICAL  DATA:  Shortness of breath with pulmonary embolism suspected EXAM: CT ANGIOGRAPHY CHEST WITH CONTRAST TECHNIQUE: Multidetector CT imaging of the chest was performed using the standard protocol during bolus administration of intravenous contrast. Multiplanar CT image reconstructions and MIPs were obtained to evaluate the vascular anatomy. RADIATION DOSE REDUCTION: This exam was performed according to the departmental dose-optimization program which includes automated exposure control, adjustment of the mA and/or kV according to patient size and/or use of iterative reconstruction technique. CONTRAST:  76mL OMNIPAQUE IOHEXOL 350 MG/ML SOLN COMPARISON:  None Available. FINDINGS: Cardiovascular: Satisfactory opacification of the pulmonary arteries. Posterior segment pulmonary embolism to the left upper lobe. Non filling of right lower lobe subsegmental branches where there are changes of pulmonary infarct is well. Medial segmental right middle lobe pulmonary embolism with pulmonary infarct. Cannot accurately assess the RV to LV ratio given non opacification of the left ventricle, there is multi chamber cardiac enlargement at baseline is well. Non-opacified aorta Mediastinum/Nodes: No mass or adenopathy Lungs/Pleura: Ground-glass opacity in the right middle and lower lobes associated with emboli. Interlobular septal thickening diffusely with small right pleural effusion Upper Abdomen: Hepatic venous reflux attributed to elevated right heart pressures, possibly chronic given the cardiac size. Musculoskeletal: No acute finding Review of the MIP images confirms the above findings. Critical Value/emergent results were called by telephone at the time of interpretation on 01/06/2022 at 7:40 am to provider Dr. Broadus John, who verbally acknowledged these results. IMPRESSION: 1. Positive for multifocal acute pulmonary emboli, segmental to subsegmental, with infarcts in the right middle and lower lobes. 2. Cardiomegaly with  mild  interstitial pulmonary edema and small right pleural effusion. Electronically Signed   By: Tiburcio Pea M.D.   On: 01/06/2022 07:40   DG Chest 2 View  Result Date: 01/05/2022 CLINICAL DATA:  Shortness of breath EXAM: CHEST - 2 VIEW COMPARISON:  09/19/2020 FINDINGS: Transverse diameter of heart is increased. Central pulmonary vessels are more prominent. Increased interstitial markings are seen in parahilar regions and lower lung fields. Linear patchy densities are seen in right lower lung field. There is blunting of right lateral CP angle. There is no pneumothorax. IMPRESSION: Cardiomegaly. Central pulmonary vessels are more prominent suggesting CHF. Increased markings are seen in right lower lung fields suggesting atelectasis/pneumonia. Part of this finding may be due to asymmetric pulmonary edema. Small right pleural effusion is seen. Electronically Signed   By: Ernie Avena M.D.   On: 01/05/2022 21:27    Cardiac Studies   Echo from 01/06/2022 LVEF 20 to 25% Moderate LVH RV systolic function severely reduced Trivial MR  Assessment & Plan   Patient is a 52 y/o M known to have CAD s/p RCA PCI with recent LHC in 2022 showing RCA CTO after previously placed stents, RCA ISR 60% with LVEF 40-45%, HTN, HLD, ICM 40-45%, admitted to medicine team for ADHF and Covid infection. Cardiology consulted for troponin elevation and ADHF.  # ADHF / LVEF 20 to 25% with no device, new onset (dropped from 40% in 2022) / NYHA class III / ACC AHA stage C, currently decompensated -Increase IV Lasix from 40 mg to 60 mg twice daily (Lasix nave).  CMP in a.m. and BMP in the p.m.  Keep K>4 and <5; Mg>2 and <3. -Continue carvedilol 12.5 mg twice daily (home dose is 25 mg twice daily) -Will start Entresto when serum creatinine is normalized -Continue spironolactone 25 mg once daily -Continue Farxiga 10 mg once daily -Daily weights, ins and outs, 1.2L fluid restriction -Patient will need invasive ischemic  evaluation once compensated and COVID infection is resolved. Probably inpatient if he continues to have chest tightness despite volume optimization and appropriate management of COVID infection.  # CAD s/p RCA ISR 60% and RCA CTO distal to the stent -Reports chest tightness since admission.  Continue Imdur 30 mg once daily. -Continue aspirin 81 mg once daily and atorvastatin 80 mg nightly  #HTN, controlled -Increase IV Lasix from 40 mg to 60 mg twice daily -Continue Coreg 12.5 mg twice daily (home dose is 25 mg twice daily) -Will start Entresto when serum creatinine is normalized -Continue spironolactone 25 mg once daily  #Covid infection -Management per primary team  I have spent a total of 33 minutes with patient reviewing chart , telemetry, EKGs, labs and examining patient as well as establishing an assessment and plan that was discussed with the patient.  > 50% of time was spent in direct patient care.      Signed, Marjo Bicker, MD  01/07/2022, 10:24 AM

## 2022-01-07 NOTE — Plan of Care (Signed)
  Problem: Education: Goal: Ability to demonstrate management of disease process will improve Outcome: Progressing Goal: Ability to verbalize understanding of medication therapies will improve Outcome: Progressing Goal: Individualized Educational Video(s) Outcome: Progressing   Problem: Activity: Goal: Capacity to carry out activities will improve Outcome: Progressing   Problem: Cardiac: Goal: Ability to achieve and maintain adequate cardiopulmonary perfusion will improve Outcome: Progressing   Problem: Education: Goal: Knowledge of risk factors and measures for prevention of condition will improve Outcome: Progressing   Problem: Coping: Goal: Psychosocial and spiritual needs will be supported Outcome: Progressing   Problem: Respiratory: Goal: Will maintain a patent airway Outcome: Progressing Goal: Complications related to the disease process, condition or treatment will be avoided or minimized Outcome: Progressing   Problem: Education: Goal: Knowledge of General Education information will improve Description: Including pain rating scale, medication(s)/side effects and non-pharmacologic comfort measures Outcome: Progressing   Problem: Health Behavior/Discharge Planning: Goal: Ability to manage health-related needs will improve Outcome: Progressing   Problem: Clinical Measurements: Goal: Ability to maintain clinical measurements within normal limits will improve Outcome: Progressing Goal: Will remain free from infection Outcome: Progressing Goal: Diagnostic test results will improve Outcome: Progressing Goal: Respiratory complications will improve Outcome: Progressing Goal: Cardiovascular complication will be avoided Outcome: Progressing   Problem: Activity: Goal: Risk for activity intolerance will decrease Outcome: Progressing   Problem: Nutrition: Goal: Adequate nutrition will be maintained Outcome: Progressing   Problem: Coping: Goal: Level of anxiety  will decrease Outcome: Progressing   Problem: Elimination: Goal: Will not experience complications related to bowel motility Outcome: Progressing Goal: Will not experience complications related to urinary retention Outcome: Progressing   Problem: Pain Managment: Goal: General experience of comfort will improve Outcome: Progressing   Problem: Safety: Goal: Ability to remain free from injury will improve Outcome: Progressing   Problem: Skin Integrity: Goal: Risk for impaired skin integrity will decrease Outcome: Progressing   

## 2022-01-08 ENCOUNTER — Encounter (HOSPITAL_COMMUNITY): Payer: Self-pay | Admitting: Internal Medicine

## 2022-01-08 ENCOUNTER — Other Ambulatory Visit (HOSPITAL_COMMUNITY): Payer: Self-pay

## 2022-01-08 DIAGNOSIS — I25119 Atherosclerotic heart disease of native coronary artery with unspecified angina pectoris: Secondary | ICD-10-CM | POA: Diagnosis not present

## 2022-01-08 DIAGNOSIS — I5023 Acute on chronic systolic (congestive) heart failure: Secondary | ICD-10-CM | POA: Diagnosis not present

## 2022-01-08 DIAGNOSIS — I255 Ischemic cardiomyopathy: Secondary | ICD-10-CM | POA: Diagnosis not present

## 2022-01-08 DIAGNOSIS — I2609 Other pulmonary embolism with acute cor pulmonale: Secondary | ICD-10-CM

## 2022-01-08 DIAGNOSIS — I10 Essential (primary) hypertension: Secondary | ICD-10-CM | POA: Diagnosis not present

## 2022-01-08 LAB — BASIC METABOLIC PANEL
Anion gap: 8 (ref 5–15)
BUN: 22 mg/dL — ABNORMAL HIGH (ref 6–20)
CO2: 26 mmol/L (ref 22–32)
Calcium: 8.2 mg/dL — ABNORMAL LOW (ref 8.9–10.3)
Chloride: 96 mmol/L — ABNORMAL LOW (ref 98–111)
Creatinine, Ser: 1.43 mg/dL — ABNORMAL HIGH (ref 0.61–1.24)
GFR, Estimated: 60 mL/min — ABNORMAL LOW (ref >60)
Glucose, Bld: 138 mg/dL — ABNORMAL HIGH (ref 70–99)
Potassium: 3.8 mmol/L (ref 3.5–5.1)
Sodium: 130 mmol/L — ABNORMAL LOW (ref 135–145)

## 2022-01-08 LAB — HEPARIN LEVEL (UNFRACTIONATED)
Heparin Unfractionated: 0.19 IU/mL — ABNORMAL LOW (ref 0.30–0.70)
Heparin Unfractionated: 0.28 IU/mL — ABNORMAL LOW (ref 0.30–0.70)

## 2022-01-08 LAB — CBC
HCT: 30.5 % — ABNORMAL LOW (ref 39.0–52.0)
Hemoglobin: 9.2 g/dL — ABNORMAL LOW (ref 13.0–17.0)
MCH: 20.9 pg — ABNORMAL LOW (ref 26.0–34.0)
MCHC: 30.2 g/dL (ref 30.0–36.0)
MCV: 69.3 fL — ABNORMAL LOW (ref 80.0–100.0)
Platelets: 305 10*3/uL (ref 150–400)
RBC: 4.4 MIL/uL (ref 4.22–5.81)
RDW: 18.2 % — ABNORMAL HIGH (ref 11.5–15.5)
WBC: 12.2 10*3/uL — ABNORMAL HIGH (ref 4.0–10.5)
nRBC: 0 % (ref 0.0–0.2)

## 2022-01-08 LAB — FERRITIN: Ferritin: 92 ng/mL (ref 24–336)

## 2022-01-08 LAB — HEMOGLOBIN A1C
Hgb A1c MFr Bld: 6.6 % — ABNORMAL HIGH (ref 4.8–5.6)
Mean Plasma Glucose: 143 mg/dL

## 2022-01-08 LAB — C-REACTIVE PROTEIN: CRP: 12.4 mg/dL — ABNORMAL HIGH (ref <1.0)

## 2022-01-08 LAB — GLUCOSE, CAPILLARY: Glucose-Capillary: 152 mg/dL — ABNORMAL HIGH (ref 70–99)

## 2022-01-08 MED ORDER — APIXABAN 5 MG PO TABS
5.0000 mg | ORAL_TABLET | Freq: Two times a day (BID) | ORAL | Status: AC
  Administered 2022-01-08 – 2022-01-09 (×3): 5 mg via ORAL
  Filled 2022-01-08 (×3): qty 1

## 2022-01-08 MED ORDER — APIXABAN 5 MG PO TABS: 10.0000 mg | ORAL_TABLET | Freq: Two times a day (BID) | ORAL | Status: DC

## 2022-01-08 MED ORDER — APIXABAN 5 MG PO TABS: 5.0000 mg | ORAL_TABLET | Freq: Two times a day (BID) | ORAL | Status: DC

## 2022-01-08 MED ORDER — EMPAGLIFLOZIN 10 MG PO TABS
10.0000 mg | ORAL_TABLET | Freq: Every day | ORAL | Status: DC
  Administered 2022-01-09 – 2022-01-10 (×2): 10 mg via ORAL
  Filled 2022-01-08 (×2): qty 1

## 2022-01-08 MED ORDER — FUROSEMIDE 10 MG/ML IJ SOLN
40.0000 mg | Freq: Two times a day (BID) | INTRAMUSCULAR | Status: DC
  Administered 2022-01-08 – 2022-01-10 (×5): 40 mg via INTRAVENOUS
  Filled 2022-01-08 (×5): qty 4

## 2022-01-08 MED ORDER — RIVAROXABAN 15 MG PO TABS
15.0000 mg | ORAL_TABLET | Freq: Two times a day (BID) | ORAL | Status: DC
  Administered 2022-01-08: 15 mg via ORAL
  Filled 2022-01-08: qty 1

## 2022-01-08 MED ORDER — HEPARIN BOLUS VIA INFUSION
3000.0000 [IU] | Freq: Once | INTRAVENOUS | Status: AC
  Administered 2022-01-08: 3000 [IU] via INTRAVENOUS
  Filled 2022-01-08: qty 3000

## 2022-01-08 MED ORDER — APIXABAN 5 MG PO TABS
10.0000 mg | ORAL_TABLET | Freq: Two times a day (BID) | ORAL | Status: DC
  Administered 2022-01-10: 10 mg via ORAL
  Filled 2022-01-08 (×2): qty 2

## 2022-01-08 MED ORDER — ISOSORBIDE MONONITRATE ER 30 MG PO TB24: 15.0000 mg | ORAL_TABLET | Freq: Every day | ORAL | Status: DC

## 2022-01-08 MED ORDER — RIVAROXABAN 20 MG PO TABS: 20.0000 mg | ORAL_TABLET | Freq: Every day | ORAL | Status: DC

## 2022-01-08 MED ORDER — LOSARTAN POTASSIUM 25 MG PO TABS
25.0000 mg | ORAL_TABLET | Freq: Every day | ORAL | Status: DC
  Administered 2022-01-08: 25 mg via ORAL
  Filled 2022-01-08: qty 1

## 2022-01-08 MED ORDER — FUROSEMIDE 20 MG PO TABS
20.0000 mg | ORAL_TABLET | Freq: Every day | ORAL | Status: DC
  Administered 2022-01-08: 20 mg via ORAL
  Filled 2022-01-08: qty 1

## 2022-01-08 MED ORDER — POTASSIUM CHLORIDE CRYS ER 20 MEQ PO TBCR
40.0000 meq | EXTENDED_RELEASE_TABLET | Freq: Once | ORAL | Status: AC
  Administered 2022-01-08: 40 meq via ORAL
  Filled 2022-01-08: qty 2

## 2022-01-08 NOTE — Progress Notes (Signed)
Cardiologist:  Oneal  Subjective:  Denies SSCP, palpitations or Dyspnea   Objective:  Vitals:   01/08/22 0000 01/08/22 0001 01/08/22 0436 01/08/22 0600  BP:  123/87 125/84   Pulse:  72 74   Resp:   18   Temp: 97.8 F (36.6 C) 97.8 F (36.6 C) 100 F (37.8 C)   TempSrc:  Oral Oral   SpO2:  97% 98%   Weight:    77.2 kg  Height:        Intake/Output from previous day:  Intake/Output Summary (Last 24 hours) at 01/08/2022 0836 Last data filed at 01/08/2022 0400 Gross per 24 hour  Intake 292.76 ml  Output 2350 ml  Net -2057.24 ml    Physical Exam: Affect appropriate Black male in no distress  HEENT: normal Neck supple with no adenopathy JVP normal no bruits no thyromegaly Lungs clear with no wheezing and good diaphragmatic motion Heart:  S1/S2 no murmur, no rub, gallop or click PMI normal Abdomen: benighn, BS positve, no tenderness, no AAA no bruit.  No HSM or HJR Distal pulses intact with no bruits No edema Neuro non-focal Skin warm and dry No muscular weakness   Lab Results: Basic Metabolic Panel: Recent Labs    01/07/22 0039 01/08/22 0048  NA 134* 130*  K 3.4* 3.8  CL 98 96*  CO2 27 26  GLUCOSE 110* 138*  BUN 17 22*  CREATININE 1.26* 1.43*  CALCIUM 8.2* 8.2*   Liver Function Tests: Recent Labs    01/05/22 2116 01/06/22 0326  AST 60* 54*  ALT 88* 86*  ALKPHOS 54 56  BILITOT 1.6* 1.6*  PROT 6.2* 6.3*  ALBUMIN 3.2* 3.2*   No results for input(s): "LIPASE", "AMYLASE" in the last 72 hours. CBC: Recent Labs    01/05/22 2116 01/06/22 0220 01/07/22 0039 01/08/22 0048  WBC 8.4   < > 12.7* 12.2*  NEUTROABS 7.1  --   --   --   HGB 10.6*   < > 9.1* 9.2*  HCT 35.5*   < > 30.3* 30.5*  MCV 70.2*   < > 69.7* 69.3*  PLT 318   < > 279 305   < > = values in this interval not displayed.   Cardiac Enzymes: No results for input(s): "CKTOTAL", "CKMB", "CKMBINDEX", "TROPONINI" in the last 72 hours. BNP: Invalid input(s): "POCBNP" D-Dimer: Recent  Labs    01/06/22 0220  DDIMER 4.68*    Thyroid Function Tests: Recent Labs    01/06/22 0220  TSH 1.662   Anemia Panel: Recent Labs    01/08/22 0048  FERRITIN 92    Imaging: VAS [place] LOWER EXTREMITY VENOUS (DVT)  Result Date: 01/07/2022  Lower Venous DVT Study Patient Name:  Russell Baldwin  Date of Exam:   01/06/2022 Medical Rec #: [id number]       Accession #:    [id number] Date of Birth: 1970-01-27       Patient Gender: M Patient Age:   52 years Exam Location:  [facility] Procedure:      VAS [place] LOWER EXTREMITY VENOUS (DVT) Referring Phys: Myles Rosenthal --------------------------------------------------------------------------------  Indications: Pulmonary embolism. Covid-19. Elevated d-dimer.  Limitations: Poor ultrasound/tissue interface. Performing Technologist: Darlin Coco RDMS, RVT  Examination Guidelines: A complete evaluation includes B-mode imaging, spectral Doppler, color Doppler, and power Doppler as needed of all accessible portions of each vessel. Bilateral testing is considered an integral part of a complete examination. Limited examinations for reoccurring indications may be performed as noted. The  reflux portion of the exam is performed with the patient in reverse Trendelenburg.  +---------+---------------+---------+-----------+----------+--------------+ RIGHT    CompressibilityPhasicitySpontaneityPropertiesThrombus Aging +---------+---------------+---------+-----------+----------+--------------+ CFV      Full           No       Yes                                 +---------+---------------+---------+-----------+----------+--------------+ SFJ      Full                                                        +---------+---------------+---------+-----------+----------+--------------+ FV Prox  Full                                                        +---------+---------------+---------+-----------+----------+--------------+ FV Mid   Full                                                         +---------+---------------+---------+-----------+----------+--------------+ FV DistalFull                                                        +---------+---------------+---------+-----------+----------+--------------+ PFV      Full                                                        +---------+---------------+---------+-----------+----------+--------------+ POP      Full           No       Yes                                 +---------+---------------+---------+-----------+----------+--------------+ PTV      Full                                                        +---------+---------------+---------+-----------+----------+--------------+ PERO     Full                                                        +---------+---------------+---------+-----------+----------+--------------+   +---------+---------------+---------+-----------+----------+--------------+ LEFT     CompressibilityPhasicitySpontaneityPropertiesThrombus Aging +---------+---------------+---------+-----------+----------+--------------+ CFV      Full           No  Yes                                 +---------+---------------+---------+-----------+----------+--------------+ SFJ      Full                                                        +---------+---------------+---------+-----------+----------+--------------+ FV Prox  Full                                                        +---------+---------------+---------+-----------+----------+--------------+ FV Mid   Full                                                        +---------+---------------+---------+-----------+----------+--------------+ FV DistalFull                                                        +---------+---------------+---------+-----------+----------+--------------+ PFV      Full                                                         +---------+---------------+---------+-----------+----------+--------------+ POP      Full           No       Yes                                 +---------+---------------+---------+-----------+----------+--------------+ PTV      Full                                                        +---------+---------------+---------+-----------+----------+--------------+ PERO     Full                                                        +---------+---------------+---------+-----------+----------+--------------+     Summary: RIGHT: - There is no evidence of deep vein thrombosis in the lower extremity.  - No cystic structure found in the popliteal fossa.  LEFT: - There is no evidence of deep vein thrombosis in the lower extremity.  - No cystic structure found in the popliteal fossa.  *See table(s) above for measurements and observations. Electronically signed by Orlie Pollen on 01/07/2022 at 7:54:27 AM.    Final  ECHOCARDIOGRAM COMPLETE  Result Date: 01/06/2022    ECHOCARDIOGRAM REPORT   Patient Name:   Russell Baldwin Date of Exam: 01/06/2022 Medical Rec #:  [id number]      Height:       70.0 in Accession #:    [id number]     Weight:       180.0 lb Date of Birth:  02-Oct-1970      BSA:          1.996 m Patient Age:    42 years       BP:           134/103 mmHg Patient Gender: M              HR:           86 bpm. Exam Location:  Inpatient Procedure: 2D Echo and Intracardiac Opacification Agent Indications:    CHF  History:        Patient has prior history of Echocardiogram examinations, most                 recent 09/19/2020. CHF, CAD; Risk Factors:Hypertension and                 Dyslipidemia.  Sonographer:    Harvie Junior Referring Phys: [id number] [place]  1. Left ventricular ejection fraction, by estimation, is 20 to 25%. The left ventricle has severely decreased function. The left ventricle demonstrates global hypokinesis. The left ventricular internal cavity size was mildly to  moderately dilated. Moderate to severe left ventricular hypertrophy. Left ventricular diastolic function could not be evaluated.  2. Right ventricular systolic function is severely reduced. The right ventricular size is moderately enlarged. There is mildly elevated pulmonary artery systolic pressure. The estimated right ventricular systolic pressure is 123XX123 mmHg.  3. Left atrial size was mildly dilated.  4. Right atrial size was mild to moderately dilated.  5. The mitral valve is normal in structure. Trivial mitral valve regurgitation. No evidence of mitral stenosis.  6. The aortic valve is tricuspid. Aortic valve regurgitation is not visualized. No aortic stenosis is present.  7. The inferior vena cava is dilated in size with <50% respiratory variability, suggesting right atrial pressure of 15 mmHg. Comparison(s): Changes from prior study are noted. LVEF worsened to 20-25% now compared to 40-45% from 2022. FINDINGS  Left Ventricle: Left ventricular ejection fraction, by estimation, is 20 to 25%. The left ventricle has severely decreased function. The left ventricle demonstrates global hypokinesis. Definity contrast agent was given IV to delineate the left ventricular endocardial borders. The left ventricular internal cavity size was mildly to moderately dilated. Moderate to severe left ventricular hypertrophy. Left ventricular diastolic function could not be evaluated due to nondiagnostic images. Left ventricular diastolic function could not be evaluated. Right Ventricle: The right ventricular size is moderately enlarged. No increase in right ventricular wall thickness. Right ventricular systolic function is severely reduced. There is mildly elevated pulmonary artery systolic pressure. The tricuspid regurgitant velocity is 2.36 m/s, and with an assumed right atrial pressure of 15 mmHg, the estimated right ventricular systolic pressure is 123XX123 mmHg. Left Atrium: Left atrial size was mildly dilated. Right Atrium:  Right atrial size was mild to moderately dilated. Pericardium: Trivial pericardial effusion is present. Mitral Valve: The mitral valve is normal in structure. Trivial mitral valve regurgitation. No evidence of mitral valve stenosis. Tricuspid Valve: The tricuspid valve is normal in structure. Tricuspid valve regurgitation is trivial. No evidence of tricuspid stenosis. Aortic Valve:  The aortic valve is tricuspid. Aortic valve regurgitation is not visualized. No aortic stenosis is present. Aortic valve mean gradient measures 3.7 mmHg. Aortic valve peak gradient measures 6.9 mmHg. Aortic valve area, by VTI measures 1.35 cm. Pulmonic Valve: The pulmonic valve was normal in structure. Pulmonic valve regurgitation is mild to moderate. No evidence of pulmonic stenosis. Aorta: The aortic root and ascending aorta are structurally normal, with no evidence of dilitation. Venous: The inferior vena cava is dilated in size with less than 50% respiratory variability, suggesting right atrial pressure of 15 mmHg. IAS/Shunts: No atrial level shunt detected by color flow Doppler.  LEFT VENTRICLE PLAX 2D LVIDd:         5.80 cm      Diastology LVIDs:         4.90 cm      LV e' medial:  3.70 cm/s LV PW:         1.50 cm      LV e' lateral: 9.57 cm/s LV IVS:        1.50 cm LVOT diam:     2.00 cm LV SV:         31 LV SV Index:   15 LVOT Area:     3.14 cm  LV Volumes (MOD) LV vol d, MOD A2C: 282.5 ml LV vol d, MOD A4C: 260.5 ml LV vol s, MOD A2C: 219.5 ml LV vol s, MOD A4C: 176.0 ml LV SV MOD A2C:     63.0 ml LV SV MOD A4C:     260.5 ml LV SV MOD BP:      77.2 ml RIGHT VENTRICLE RV Basal diam:  4.70 cm RV Mid diam:    3.90 cm RV S prime:     8.38 cm/s TAPSE (M-mode): 1.0 cm LEFT ATRIUM             Index        RIGHT ATRIUM           Index LA diam:        4.00 cm 2.00 cm/m   RA Area:     22.30 cm LA Vol (A2C):   88.7 ml 44.44 ml/m  RA Volume:   70.10 ml  35.12 ml/m LA Vol (A4C):   58.3 ml 29.21 ml/m LA Biplane Vol: 71.4 ml 35.77 ml/m   AORTIC VALVE                    PULMONIC VALVE AV Area (Vmax):    1.87 cm     PV Vmax:          1.07 m/s AV Area (Vmean):   1.67 cm     PV Peak grad:     4.6 mmHg AV Area (VTI):     1.35 cm     PR End Diast Vel: 3.51 msec AV Vmax:           131.00 cm/s AV Vmean:          86.967 cm/s AV VTI:            0.229 m AV Peak Grad:      6.9 mmHg AV Mean Grad:      3.7 mmHg LVOT Vmax:         78.10 cm/s LVOT Vmean:        46.100 cm/s LVOT VTI:          0.098 m LVOT/AV VTI ratio: 0.43  AORTA Ao Root diam: 3.20 cm Ao  Asc diam:  3.60 cm MR Peak grad: 28.6 mmHg   TRICUSPID VALVE MR Vmax:      267.50 cm/s TR Peak grad:   22.3 mmHg                           TR Vmax:        236.00 cm/s                            SHUNTS                           Systemic VTI:  0.10 m                           Systemic Diam: 2.00 cm Vishnu Priya Mallipeddi Electronically signed by Winfield Rast Mallipeddi Signature Date/Time: 01/06/2022/1:15:15 PM    Final     Cardiac Studies:  ECG: SR bi atrial enlargement inferior lateral T wave changes    Telemetry:  NSR 01/08/2022   Echo: EF 20-25% RV dysfunction   Medications:    aspirin  81 mg Oral Daily   atorvastatin  80 mg Oral q1800   carvedilol  12.5 mg Oral BID WC   clopidogrel  75 mg Oral Daily   dapagliflozin propanediol  10 mg Oral Daily   furosemide  60 mg Intravenous BID   isosorbide mononitrate  30 mg Oral Daily   nirmatrelvir/ritonavir  3 tablet Oral BID   spironolactone  25 mg Oral Daily      heparin 1,700 Units/hr (01/08/22 0400)    Assessment/Plan:   PE:   explains chest tightness and dyspnea ? 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

## 2022-01-08 NOTE — Progress Notes (Signed)
   01/08/22 1057  Mobility  Activity Ambulated independently in hallway  Level of Assistance Independent  Assistive Device None  Distance Ambulated (ft) 60 ft  Activity Response Tolerated well  Mobility Referral Yes  $Mobility charge 1 Mobility   Mobility Specialist Progress Note  Pre-Mobility: 100% SpO2 During Mobility: 98% SpO2 Post-Mobility: 100% SpO2  Received pt EOB having no complaints and agreeable to mobility. Pt was asymptomatic throughout ambulation and returned to room w/o fault. Left EOB w/ call bell in reach and all needs met.  Lucious Groves Mobility Specialist  Please contact via SecureChat or Rehab office at [phone]

## 2022-01-08 NOTE — Progress Notes (Signed)
ANTICOAGULATION CONSULT NOTE  Pharmacy Consult for heparin >> Rivaroxaban  Indication: pulmonary embolus  Heparin Dosing Weight: 81.6 kg  Labs: Recent Labs    01/06/22 0326 01/06/22 1232 01/07/22 0039 01/08/22 0048 01/08/22 0818  HGB 10.5*  --  9.1* 9.2*  --   HCT 34.9*  --  30.3* 30.5*  --   PLT 300  --  279 305  --   LABPROT 16.3*  --   --   --   --   INR 1.3*  --   --   --   --   HEPARINUNFRC  --    < > 0.42 0.19* 0.28*  CREATININE 1.36*  --  1.26* 1.43*  --    < > = values in this interval not displayed.    Assessment: 52 yo m with hx CAD s/p inferior wall STEMI with DES to RCA in 2012 presenting acute segmental to subsegmental PE, no RHS and Covid-19 positive. Patient is not on anticoagulation PTA and has reportedly been noncompliant with his home medications for a year.  Pharmacy consulted to dose heparin.  Heparin level 0.28 is subtherapeutic on 1700 units/hr and after 3000 unit bolus. No reported bleeding. Discussed with team, no plan for cath so will transition to rivaroxaban.    Goal of Therapy:  Heparin level 0.3-0.7 units/ml Monitor platelets by anticoagulation protocol: Yes   Plan:  Stop heparin Start rivaroxaban 15mg  BID x21 days then decrease to 20mg  daily  Monitor for signs/symptoms of bleeding    Benetta Spar, PharmD, BCPS, BCCP Clinical Pharmacist  Please check AMION for all [place] phone numbers After 10:00 PM, call [place]

## 2022-01-08 NOTE — Progress Notes (Addendum)
PROGRESS NOTE    Russell Baldwin  MWN:[id number] DOB: 12/18/1970 DOA: 01/05/2022 PCP: Pcp, No  52 y.o. male with a hx of essential hypertension, HLD, ischemic cardiomyopathy, coronary artery disease status post STEMI with PCI and DES to his proximal RCA in 2016. EF 40-45%, G2 DD, mild mitral valve regurgitation, had a cath 9/22 which noted single-vessel CAD, 100% stenosis of mid RCA with collaterals that was managed medically presented to the ED with worsening shortness of breath, abdominal wall tightness and leg swelling for 3 to 4 days, history of PND and orthopnea and some pleuritic chest pain with cough. -He has been off his medications for a few months, has not seen a cardiologist last year In the ED hypertensive blood pressure 174/131, chest x-ray with pulmonary edema, creatinine 1.2, BNP 2539, troponin 163, COVID-positive -1/6, CTA chest positive for bilateral pulmonary embolism, pulmonary infarct -Echo with drop in EF to 20-25% with severe RV dysfunction, cardiology consulting  Subjective: -Feels better overall, breathing is improving, swelling starting to improve  Assessment and Plan:  Acute on chronic systolic CHF BiV failure -Known ischemic cardiomyopathy with EF of 40%, repeat echo with EF down to 20-25%, severely reduced RV function and severe LVH -Recent poor compliance with diuretics and follow-up -Urine output inaccurate initially, now 2.5 L negative, weight down 10lbs -Continue Farxiga, Aldactone, Imdur, started on low-dose losartan, monitor kidney function -Still has 1-2+ edema, continue IV Lasix today, per cards ischemic workup deferred at this time -Needs TOC follow-up, med assistance  CAD -Prior RCA stent 2016 -Cath 9/22 with 100% mid RCA stenosis with collaterals, managed medically -Continue aspirin, Plavix, statin, beta-blocker -Cards consulting, see discussion above, significant drop in EF  Acute bilateral PE -Could be in the setting of COVID, patient reports  Covid Symptoms over a month ago -On IV heparin, changed to Xarelto  SARS COVID-19 infection -No recent symptoms, patient reportedly had symptoms in late November -Monitor clinically, airborne precautions X 10 days, continue Paxlovid day 4/5 -No hypoxia, CT chest without evidence of COVID-pneumonia  Essential hypertension -Imdur, Coreg, Aldactone, Lasix   DVT prophylaxis: Xarelto Code Status: Full code Family Communication: None present Disposition Plan: Home likely 1 to 2 days  Consultants: Cardiology   Procedures:   Antimicrobials:    Objective: Vitals:   01/08/22 0436 01/08/22 0600 01/08/22 0924 01/08/22 1123  BP: 125/84  (!) 113/91 126/81  Pulse: 74  74 74  Resp: 18   18  Temp: 100 F (37.8 C)  97.8 F (36.6 C) 98.2 F (36.8 C)  TempSrc: Oral  Axillary Oral  SpO2: 98%   99%  Weight:  77.2 kg    Height:        Intake/Output Summary (Last 24 hours) at 01/08/2022 1141 Last data filed at 01/08/2022 1000 Gross per 24 hour  Intake 512.76 ml  Output 2350 ml  Net -1837.24 ml   Filed Weights   01/06/22 1654 01/07/22 0605 01/08/22 0600  Weight: 81 kg 79.6 kg 77.2 kg    Examination:  General exam: Pleasant young male sitting up in bed, AAOx3, no distress HEENT: Positive JVD CVS: S1-S2, regular rhythm Lungs: Decreased breath sounds to bases Abdomen: Soft, nontender, bowel sounds present Extremities: 1-2+ edema  Skin: No rashes Psychiatry:  Mood & affect appropriate.     Data Reviewed:   CBC: Recent Labs  Lab 01/05/22 2116 01/06/22 0220 01/06/22 0326 01/07/22 0039 01/08/22 0048  WBC 8.4 9.5 10.7* 12.7* 12.2*  NEUTROABS 7.1  --   --   --   --  HGB 10.6* 10.5* 10.5* 9.1* 9.2*  HCT 35.5* 34.8* 34.9* 30.3* 30.5*  MCV 70.2* 70.3* 69.8* 69.7* 69.3*  PLT 318 286 300 279 [id number]   Basic Metabolic Panel: Recent Labs  Lab 01/05/22 2116 01/06/22 0220 01/06/22 0326 01/07/22 0039 01/08/22 0048  NA 135  --  135 134* 130*  K 3.7  --  3.6 3.4* 3.8  CL 102   --  99 98 96*  CO2 23  --  27 27 26   GLUCOSE 112*  --  141* 110* 138*  BUN 13  --  13 17 22*  CREATININE 1.29* 1.30* 1.36* 1.26* 1.43*  CALCIUM 8.6*  --  8.6* 8.2* 8.2*   GFR: Estimated Creatinine Clearance: 63.8 mL/min (A) (by C-G formula based on SCr of 1.43 mg/dL (H)). Liver Function Tests: Recent Labs  Lab 01/05/22 2116 01/06/22 0326  AST 60* 54*  ALT 88* 86*  ALKPHOS 54 56  BILITOT 1.6* 1.6*  PROT 6.2* 6.3*  ALBUMIN 3.2* 3.2*   No results for input(s): "LIPASE", "AMYLASE" in the last 168 hours. No results for input(s): "AMMONIA" in the last 168 hours. Coagulation Profile: Recent Labs  Lab 01/06/22 0326  INR 1.3*   Cardiac Enzymes: No results for input(s): "CKTOTAL", "CKMB", "CKMBINDEX", "TROPONINI" in the last 168 hours. BNP (last 3 results) No results for input(s): "PROBNP" in the last 8760 hours. HbA1C: Recent Labs    01/06/22 0220  HGBA1C 6.6*   CBG: Recent Labs  Lab 01/07/22 0744 01/08/22 0616  GLUCAP 90 152*   Lipid Profile: No results for input(s): "CHOL", "HDL", "LDLCALC", "TRIG", "CHOLHDL", "LDLDIRECT" in the last 72 hours. Thyroid Function Tests: Recent Labs    01/06/22 0220  TSH 1.662   Anemia Panel: Recent Labs    01/07/22 0039 01/08/22 0048  FERRITIN 61 92   Urine analysis:    Component Value Date/Time   COLORURINE YELLOW 05/23/2010 [place] 05/23/2010 1009   LABSPEC >=1.030 08/27/2012 1820   PHURINE 6.0 08/27/2012 1820   GLUCOSEU NEGATIVE 08/27/2012 1820   HGBUR NEGATIVE 08/27/2012 1820   BILIRUBINUR NEGATIVE 08/27/2012 1820   KETONESUR NEGATIVE 08/27/2012 1820   PROTEINUR 30 (A) 08/27/2012 1820   UROBILINOGEN 0.2 08/27/2012 1820   NITRITE NEGATIVE 08/27/2012 1820   LEUKOCYTESUR NEGATIVE 08/27/2012 1820   Sepsis Labs: @LABRCNTIP (procalcitonin:4,lacticidven:4)  ) Recent Results (from the past 240 hour(s))  Resp panel by RT-PCR (RSV, Flu A&B, Covid) Anterior Nasal Swab     Status: Abnormal   Collection  Time: 01/05/22  9:11 PM   Specimen: Anterior Nasal Swab  Result Value Ref Range Status   SARS Coronavirus 2 by RT PCR POSITIVE (A) NEGATIVE Final    Comment: (NOTE) SARS-CoV-2 target nucleic acids are DETECTED.  The SARS-CoV-2 RNA is generally detectable in upper respiratory specimens during the acute phase of infection. Positive results are indicative of the presence of the identified virus, but do not rule out bacterial infection or co-infection with other pathogens not detected by the test. Clinical correlation with patient history and other diagnostic information is necessary to determine patient infection status. The expected result is Negative.  Fact Sheet for Patients: EntrepreneurPulse.com.au  Fact Sheet for Healthcare Providers: IncredibleEmployment.be  This test is not yet approved or cleared by the [place] FDA and  has been authorized for detection and/or diagnosis of SARS-CoV-2 by FDA under an Emergency Use Authorization (EUA).  This EUA will remain in effect (meaning this test can be used) for the duration of  the COVID-19 declaration under Section 564(b)(1) of the A ct, 21 U.S.C. section 360bbb-3(b)(1), unless the authorization is terminated or revoked sooner.     Influenza A by PCR NEGATIVE NEGATIVE Final   Influenza B by PCR NEGATIVE NEGATIVE Final    Comment: (NOTE) The Xpert Xpress SARS-CoV-2/FLU/RSV plus assay is intended as an aid in the diagnosis of influenza from Nasopharyngeal swab specimens and should not be used as a sole basis for treatment. Nasal washings and aspirates are unacceptable for Xpert Xpress SARS-CoV-2/FLU/RSV testing.  Fact Sheet for Patients: EntrepreneurPulse.com.au  Fact Sheet for Healthcare Providers: IncredibleEmployment.be  This test is not yet approved or cleared by the [place] FDA and has been authorized for detection and/or diagnosis of  SARS-CoV-2 by FDA under an Emergency Use Authorization (EUA). This EUA will remain in effect (meaning this test can be used) for the duration of the COVID-19 declaration under Section 564(b)(1) of the Act, 21 U.S.C. section 360bbb-3(b)(1), unless the authorization is terminated or revoked.     Resp Syncytial Virus by PCR NEGATIVE NEGATIVE Final    Comment: (NOTE) Fact Sheet for Patients: EntrepreneurPulse.com.au  Fact Sheet for Healthcare Providers: IncredibleEmployment.be  This test is not yet approved or cleared by the [place] FDA and has been authorized for detection and/or diagnosis of SARS-CoV-2 by FDA under an Emergency Use Authorization (EUA). This EUA will remain in effect (meaning this test can be used) for the duration of the COVID-19 declaration under Section 564(b)(1) of the Act, 21 U.S.C. section 360bbb-3(b)(1), unless the authorization is terminated or revoked.  Performed at [facility], [place] [address] [place], [place] [address]      Radiology Studies: VAS [place] LOWER EXTREMITY VENOUS (DVT)  Result Date: 01/07/2022  Lower Venous DVT Study Patient Name:  URBANO DUNSWORTH  Date of Exam:   01/06/2022 Medical Rec #: [id number]       Accession #:    [id number] Date of Birth: [birth year]       Patient Gender: M Patient Age:   72 years Exam Location:  [facility] Procedure:      VAS [place] LOWER EXTREMITY VENOUS (DVT) Referring Phys: Myles Rosenthal --------------------------------------------------------------------------------  Indications: Pulmonary embolism. Covid-19. Elevated d-dimer.  Limitations: Poor ultrasound/tissue interface. Performing Technologist: Darlin Coco RDMS, RVT  Examination Guidelines: A complete evaluation includes B-mode imaging, spectral Doppler, color Doppler, and power Doppler as needed of all accessible portions of each vessel. Bilateral testing is considered an integral part of a complete examination.  Limited examinations for reoccurring indications may be performed as noted. The reflux portion of the exam is performed with the patient in reverse Trendelenburg.  +---------+---------------+---------+-----------+----------+--------------+ RIGHT    CompressibilityPhasicitySpontaneityPropertiesThrombus Aging +---------+---------------+---------+-----------+----------+--------------+ CFV      Full           No       Yes                                 +---------+---------------+---------+-----------+----------+--------------+ SFJ      Full                                                        +---------+---------------+---------+-----------+----------+--------------+ FV Prox  Full                                                        +---------+---------------+---------+-----------+----------+--------------+  FV Mid   Full                                                        +---------+---------------+---------+-----------+----------+--------------+ FV DistalFull                                                        +---------+---------------+---------+-----------+----------+--------------+ PFV      Full                                                        +---------+---------------+---------+-----------+----------+--------------+ POP      Full           No       Yes                                 +---------+---------------+---------+-----------+----------+--------------+ PTV      Full                                                        +---------+---------------+---------+-----------+----------+--------------+ PERO     Full                                                        +---------+---------------+---------+-----------+----------+--------------+   +---------+---------------+---------+-----------+----------+--------------+ LEFT     CompressibilityPhasicitySpontaneityPropertiesThrombus Aging  +---------+---------------+---------+-----------+----------+--------------+ CFV      Full           No       Yes                                 +---------+---------------+---------+-----------+----------+--------------+ SFJ      Full                                                        +---------+---------------+---------+-----------+----------+--------------+ FV Prox  Full                                                        +---------+---------------+---------+-----------+----------+--------------+ FV Mid   Full                                                        +---------+---------------+---------+-----------+----------+--------------+  FV DistalFull                                                        +---------+---------------+---------+-----------+----------+--------------+ PFV      Full                                                        +---------+---------------+---------+-----------+----------+--------------+ POP      Full           No       Yes                                 +---------+---------------+---------+-----------+----------+--------------+ PTV      Full                                                        +---------+---------------+---------+-----------+----------+--------------+ PERO     Full                                                        +---------+---------------+---------+-----------+----------+--------------+     Summary: RIGHT: - There is no evidence of deep vein thrombosis in the lower extremity.  - No cystic structure found in the popliteal fossa.  LEFT: - There is no evidence of deep vein thrombosis in the lower extremity.  - No cystic structure found in the popliteal fossa.  *See table(s) above for measurements and observations. Electronically signed by Orlie Pollen on 01/07/2022 at 7:54:27 AM.    Final    ECHOCARDIOGRAM COMPLETE  Result Date: 01/06/2022    ECHOCARDIOGRAM REPORT   Patient Name:    YGNACIO FECTEAU Date of Exam: 01/06/2022 Medical Rec #:  [id number]      Height:       70.0 in Accession #:    [id number]     Weight:       180.0 lb Date of Birth:  [birth year]      BSA:          1.996 m Patient Age:    59 years       BP:           134/103 mmHg Patient Gender: M              HR:           86 bpm. Exam Location:  Inpatient Procedure: 2D Echo and Intracardiac Opacification Agent Indications:    CHF  History:        Patient has prior history of Echocardiogram examinations, most                 recent 09/19/2020. CHF, CAD; Risk Factors:Hypertension and                 Dyslipidemia.  Sonographer:  Harvie Junior Referring Phys: [id number] [place]  1. Left ventricular ejection fraction, by estimation, is 20 to 25%. The left ventricle has severely decreased function. The left ventricle demonstrates global hypokinesis. The left ventricular internal cavity size was mildly to moderately dilated. Moderate to severe left ventricular hypertrophy. Left ventricular diastolic function could not be evaluated.  2. Right ventricular systolic function is severely reduced. The right ventricular size is moderately enlarged. There is mildly elevated pulmonary artery systolic pressure. The estimated right ventricular systolic pressure is 123XX123 mmHg.  3. Left atrial size was mildly dilated.  4. Right atrial size was mild to moderately dilated.  5. The mitral valve is normal in structure. Trivial mitral valve regurgitation. No evidence of mitral stenosis.  6. The aortic valve is tricuspid. Aortic valve regurgitation is not visualized. No aortic stenosis is present.  7. The inferior vena cava is dilated in size with <50% respiratory variability, suggesting right atrial pressure of 15 mmHg. Comparison(s): Changes from prior study are noted. LVEF worsened to 20-25% now compared to 40-45% from 2022. FINDINGS  Left Ventricle: Left ventricular ejection fraction, by estimation, is 20 to 25%. The left ventricle has  severely decreased function. The left ventricle demonstrates global hypokinesis. Definity contrast agent was given IV to delineate the left ventricular endocardial borders. The left ventricular internal cavity size was mildly to moderately dilated. Moderate to severe left ventricular hypertrophy. Left ventricular diastolic function could not be evaluated due to nondiagnostic images. Left ventricular diastolic function could not be evaluated. Right Ventricle: The right ventricular size is moderately enlarged. No increase in right ventricular wall thickness. Right ventricular systolic function is severely reduced. There is mildly elevated pulmonary artery systolic pressure. The tricuspid regurgitant velocity is 2.36 m/s, and with an assumed right atrial pressure of 15 mmHg, the estimated right ventricular systolic pressure is 123XX123 mmHg. Left Atrium: Left atrial size was mildly dilated. Right Atrium: Right atrial size was mild to moderately dilated. Pericardium: Trivial pericardial effusion is present. Mitral Valve: The mitral valve is normal in structure. Trivial mitral valve regurgitation. No evidence of mitral valve stenosis. Tricuspid Valve: The tricuspid valve is normal in structure. Tricuspid valve regurgitation is trivial. No evidence of tricuspid stenosis. Aortic Valve: The aortic valve is tricuspid. Aortic valve regurgitation is not visualized. No aortic stenosis is present. Aortic valve mean gradient measures 3.7 mmHg. Aortic valve peak gradient measures 6.9 mmHg. Aortic valve area, by VTI measures 1.35 cm. Pulmonic Valve: The pulmonic valve was normal in structure. Pulmonic valve regurgitation is mild to moderate. No evidence of pulmonic stenosis. Aorta: The aortic root and ascending aorta are structurally normal, with no evidence of dilitation. Venous: The inferior vena cava is dilated in size with less than 50% respiratory variability, suggesting right atrial pressure of 15 mmHg. IAS/Shunts: No atrial  level shunt detected by color flow Doppler.  LEFT VENTRICLE PLAX 2D LVIDd:         5.80 cm      Diastology LVIDs:         4.90 cm      LV e' medial:  3.70 cm/s LV PW:         1.50 cm      LV e' lateral: 9.57 cm/s LV IVS:        1.50 cm LVOT diam:     2.00 cm LV SV:         31 LV SV Index:   15 LVOT Area:     3.14 cm  LV Volumes (MOD) LV vol d, MOD A2C: 282.5 ml LV vol d, MOD A4C: 260.5 ml LV vol s, MOD A2C: 219.5 ml LV vol s, MOD A4C: 176.0 ml LV SV MOD A2C:     63.0 ml LV SV MOD A4C:     260.5 ml LV SV MOD BP:      77.2 ml RIGHT VENTRICLE RV Basal diam:  4.70 cm RV Mid diam:    3.90 cm RV S prime:     8.38 cm/s TAPSE (M-mode): 1.0 cm LEFT ATRIUM             Index        RIGHT ATRIUM           Index LA diam:        4.00 cm 2.00 cm/m   RA Area:     22.30 cm LA Vol (A2C):   88.7 ml 44.44 ml/m  RA Volume:   70.10 ml  35.12 ml/m LA Vol (A4C):   58.3 ml 29.21 ml/m LA Biplane Vol: 71.4 ml 35.77 ml/m  AORTIC VALVE                    PULMONIC VALVE AV Area (Vmax):    1.87 cm     PV Vmax:          1.07 m/s AV Area (Vmean):   1.67 cm     PV Peak grad:     4.6 mmHg AV Area (VTI):     1.35 cm     PR End Diast Vel: 3.51 msec AV Vmax:           131.00 cm/s AV Vmean:          86.967 cm/s AV VTI:            0.229 m AV Peak Grad:      6.9 mmHg AV Mean Grad:      3.7 mmHg LVOT Vmax:         78.10 cm/s LVOT Vmean:        46.100 cm/s LVOT VTI:          0.098 m LVOT/AV VTI ratio: 0.43  AORTA Ao Root diam: 3.20 cm Ao Asc diam:  3.60 cm MR Peak grad: 28.6 mmHg   TRICUSPID VALVE MR Vmax:      267.50 cm/s TR Peak grad:   22.3 mmHg                           TR Vmax:        236.00 cm/s                            SHUNTS                           Systemic VTI:  0.10 m                           Systemic Diam: 2.00 cm Vishnu Priya Mallipeddi Electronically signed by Winfield RastVishnu Priya Mallipeddi Signature Date/Time: 01/06/2022/1:15:15 PM    Final      Scheduled Meds:  aspirin  81 mg Oral Daily   atorvastatin  80 mg Oral q1800   carvedilol   12.5 mg Oral BID WC   [START ON 01/09/2022] empagliflozin  10 mg Oral Daily  furosemide  40 mg Intravenous BID   isosorbide mononitrate  30 mg Oral Daily   losartan  25 mg Oral Daily   nirmatrelvir/ritonavir  3 tablet Oral BID   Rivaroxaban  15 mg Oral BID WC   Followed by   Derrill Memo ON 01/29/2022] rivaroxaban  20 mg Oral Q supper   spironolactone  25 mg Oral Daily   Continuous Infusions:     LOS: 2 days    Time spent: 62min    Domenic Polite, MD [facility]   01/08/2022, 11:41 AM

## 2022-01-08 NOTE — Progress Notes (Signed)
Heart Failure Nurse Navigator Progress Note  PCP: Pcp, No PCP-Cardiologist: [facility] Admission Diagnosis: Acute on chronic heart failure Admitted from: Home  Presentation:   Russell Baldwin presented with shortness of breath, cough, LE edema, constant chest tightness. BP 161/119, HR 96, 3+ edema to LE, Covid +, BNP 2,538, Troponin 163, IV lasix given, CXR with cardiomegaly and edema, CTA confirmed bilateral PE, pulmonary edema, and small right pleural effusion.   Patient and father were educated on the sign and symptoms of heart failure, daily weights, when to call the doctor or go to the ED, Diet/ fluid restrictions, taking all medications as prescribed, reported that patient had been off his meds for a few months due to costs, attending all medical appointments, patient verbalized his understanding, a Hf TOC appointment was scheduled for 01/19/2022. ECHO/ LVEF: 20-25 % G2DD  Clinical Course:  Past Medical History:  Diagnosis Date   Coronary artery disease    a. 2012: inferior STEMI s/p DES to RCA,  b. 2016 inferior STEMI for late ISR s/p DES to RCA, c. LHC 09/2020 severe single vessel CAD with 100% stenosis of mid RCA with bridging and collaterals (felt to be CTO - treated medically)   HLD (hyperlipidemia)    HTN (hypertension)    Ischemic cardiomyopathy    a. LV gram EF 40% (03/2014); normal EF by echo   Prediabetes      Social History   Socioeconomic History   Marital status: Divorced    Spouse name: Not on file   Number of children: Not on file   Years of education: Not on file   Highest education level: Not on file  Occupational History   Not on file  Tobacco Use   Smoking status: Never   Smokeless tobacco: Never  Substance and Sexual Activity   Alcohol use: Yes   Drug use: No   Sexual activity: Not on file  Other Topics Concern   Not on file  Social History Narrative   Not on file   Social Determinants of Health   Financial Resource Strain: Not on file  Food  Insecurity: No Food Insecurity (01/06/2022)   Hunger Vital Sign    Worried About Running Out of Food in the Last Year: Never true    Ran Out of Food in the Last Year: Never true  Transportation Needs: No Transportation Needs (01/06/2022)   PRAPARE - Hydrologist (Medical): No    Lack of Transportation (Non-Medical): No  Physical Activity: Not on file  Stress: Not on file  Social Connections: Not on file   Education Assessment and Provision:  Detailed education and instructions provided on heart failure disease management including the following:  Signs and symptoms of Heart Failure When to call the physician Importance of daily weights Low sodium diet Fluid restriction Medication management Anticipated future follow-up appointments  Patient education given on each of the above topics.  Patient acknowledges understanding via teach back method and acceptance of all instructions.  Education Materials:  "Living Better With Heart Failure" Booklet, HF zone tool, & Daily Weight Tracker Tool.  Patient has scale at home: yes Patient has pill box at home: NA    High Risk Criteria for Readmission and/or Poor Patient Outcomes: Heart failure hospital admissions (last 6 months): 1  No Show rate: 7 % Difficult social situation: No Demonstrates medication adherence: No, Cost Primary Language: English Literacy level: Reading, writing, and comprehension  Barriers of Care:   Medication compliance d/t costs (  Pharm ) Diet/ fluid Charletta Cousin Continued HF Education  Considerations/Referrals:   Referral made to Heart Failure Pharmacist Stewardship: Yes Referral made to Heart Failure CSW/NCM TOC: No Referral made to Heart & Vascular TOC clinic: Yes, 01/19/2022  Items for Follow-up on DC/TOC: Medication compliance R/T cost ( Pharm) Diet/ fluids/ daily weights Continued HF education   Rhae Hammock, BSN, RN Heart Failure Print production planner Chat Only

## 2022-01-08 NOTE — Progress Notes (Signed)
ANTICOAGULATION CONSULT NOTE - Follow Up Consult  Pharmacy Consult for heparin Indication: pulmonary embolus  Labs: Recent Labs    01/05/22 2116 01/05/22 2352 01/06/22 0220 01/06/22 0326 01/06/22 1232 01/06/22 1620 01/07/22 0039 01/08/22 0048  HGB 10.6*  --    < > 10.5*  --   --  9.1* 9.2*  HCT 35.5*  --    < > 34.9*  --   --  30.3* 30.5*  PLT 318  --    < > 300  --   --  279 305  LABPROT  --   --   --  16.3*  --   --   --   --   INR  --   --   --  1.3*  --   --   --   --   HEPARINUNFRC  --   --   --   --    < > 0.58 0.42 0.19*  CREATININE 1.29*  --    < > 1.36*  --   --  1.26* 1.43*  TROPONINIHS 163* 164*  --   --   --   --   --   --    < > = values in this interval not displayed.    Assessment: 52yo male subtherapeutic on heparin after levels at goal; no infusion issues or signs of bleeding per RN.  Goal of Therapy:  Heparin level 0.3-0.7 units/ml   Plan:  Will rebolus with heparin 3000 units and increase heparin infusion by 3 units/kg/hr to 1700 units/hr and check level in 6 hours.    Wynona Neat, PharmD, BCPS  01/08/2022,2:15 AM

## 2022-01-08 NOTE — Progress Notes (Signed)
   Heart Failure Stewardship Pharmacist Progress Note   PCP: Pcp, No PCP-Cardiologist: Evalina Field, MD    HPI:  52 yo M with PMH of HTN, HLD, ICM, CAD s/p STEMI with PCI and DES to RCA.  He presented to the ED on 1/5 with shortness of breath, cough, LE edema, and orthopnea. Reports constant chest tightness. CXR with cardiomegaly and edema. CTA confirmed bilateral PE, cardiomegaly, pulmonary edema, and small right pleural effusion. ECHO 1/6 showed LVEF 20-25% (was 40-45% in 09/2020), global hypokinesis, moderate to severe LVH, RV severely reduced. DVT scan negative. Also found to be COVID+, likely culprit for provoked PE.   Current HF Medications: Diuretic: furosemide 40 mg IV BID Beta Blocker: carvedilol 12.5 mg BID ACE/ARB/ARNI: losartan 25 mg daily MRA: spironolactone 25 mg daily SGLT2i: Jardiance 10 mg daily Other: Imdur 15 mg daily  Prior to admission HF Medications: None - not taking any prescribed medications  Pertinent Lab Values: Serum creatinine 1.43, BUN 22, Potassium 3.8, Sodium 130, BNP 2538.5, A1c 6.6   Vital Signs: Weight: 170 lbs (admission weight: 178 lbs) Blood pressure: 120/80s  Heart rate: 70s  I/O: -1.7L yesterday; net -2.3L  Medication Assistance / Insurance Benefits Check: Does the patient have prescription insurance?  Yes Type of insurance plan: [place]:  [facility] Is the patient willing to use [place] at discharge? Yes Is the patient willing to transition their outpatient pharmacy to utilize a [facility] outpatient pharmacy?   Pending    Assessment: 1. Acute on chronic systolic CHF (LVEF 18-56%), known ICM. NYHA class III symptoms. - Agree with reducing to furosemide 40 mg IV BID. Strict I/Os and daily weights. Keep K>4 - Continue carvedilol 12.5 mg BID  - Agree with adding losartan 25 mg daily - Continue spironolactone 25 mg daily - Continue Jardiance 10 mg daily - Agree  with reducing to Imdur 15 mg daily   Plan: 1) Medication changes recommended at this time: - Agree with changes  2) Patient assistance: - has Pharmacist, community, can use monthly copay cards - Jardiance/Farxiga card only pay up to $175 per month, this will leave a copay >$400 left each month - will investigate why insurance is not paying anything toward brand name meds  3)  Education  - To be completed prior to discharge  [name], PharmD, BCPS Heart Failure Cytogeneticist Phone [phone]

## 2022-01-08 NOTE — TOC Benefit Eligibility Note (Addendum)
Patient Teacher, English as a foreign language completed.    The patient is currently admitted and upon discharge could be taking Entresto 24-26 mg.  The current 30 day co-pay is $632.08.   The patient is currently admitted and upon discharge could be taking Eliquis 5 mg.  The current 30 day co-pay is $562.51.   The patient is currently admitted and upon discharge could be taking Xarelto 20 mg.  The current 30 day co-pay is $513.40.   The patient is currently admitted and upon discharge could be taking Jardiance 10 mg.  The current 30 day co-pay is $578.31.   The patient is currently admitted and upon discharge could be taking Farxiga 10 mg.  Product Not Covered  The patient is insured through [place], [place] Patient [place] Patient Advocate Team Direct Number: [phone]  Fax: [phone]

## 2022-01-09 DIAGNOSIS — I5023 Acute on chronic systolic (congestive) heart failure: Secondary | ICD-10-CM | POA: Diagnosis not present

## 2022-01-09 DIAGNOSIS — I25119 Atherosclerotic heart disease of native coronary artery with unspecified angina pectoris: Secondary | ICD-10-CM | POA: Diagnosis not present

## 2022-01-09 DIAGNOSIS — I10 Essential (primary) hypertension: Secondary | ICD-10-CM | POA: Diagnosis not present

## 2022-01-09 DIAGNOSIS — I255 Ischemic cardiomyopathy: Secondary | ICD-10-CM | POA: Diagnosis not present

## 2022-01-09 LAB — C-REACTIVE PROTEIN: CRP: 14.1 mg/dL — ABNORMAL HIGH (ref <1.0)

## 2022-01-09 LAB — GLUCOSE, CAPILLARY: Glucose-Capillary: 101 mg/dL — ABNORMAL HIGH (ref 70–99)

## 2022-01-09 LAB — CBC
HCT: 29 % — ABNORMAL LOW (ref 39.0–52.0)
Hemoglobin: 8.8 g/dL — ABNORMAL LOW (ref 13.0–17.0)
MCH: 20.7 pg — ABNORMAL LOW (ref 26.0–34.0)
MCHC: 30.3 g/dL (ref 30.0–36.0)
MCV: 68.2 fL — ABNORMAL LOW (ref 80.0–100.0)
Platelets: 306 10*3/uL (ref 150–400)
RBC: 4.25 MIL/uL (ref 4.22–5.81)
RDW: 17.9 % — ABNORMAL HIGH (ref 11.5–15.5)
WBC: 12 10*3/uL — ABNORMAL HIGH (ref 4.0–10.5)
nRBC: 0 % (ref 0.0–0.2)

## 2022-01-09 LAB — FERRITIN: Ferritin: 97 ng/mL (ref 24–336)

## 2022-01-09 LAB — BASIC METABOLIC PANEL
Anion gap: 6 (ref 5–15)
BUN: 21 mg/dL — ABNORMAL HIGH (ref 6–20)
CO2: 26 mmol/L (ref 22–32)
Calcium: 8.3 mg/dL — ABNORMAL LOW (ref 8.9–10.3)
Chloride: 98 mmol/L (ref 98–111)
Creatinine, Ser: 1.24 mg/dL (ref 0.61–1.24)
GFR, Estimated: >60 mL/min (ref >60)
Glucose, Bld: 128 mg/dL — ABNORMAL HIGH (ref 70–99)
Potassium: 4 mmol/L (ref 3.5–5.1)
Sodium: 130 mmol/L — ABNORMAL LOW (ref 135–145)

## 2022-01-09 MED ORDER — LOSARTAN POTASSIUM 25 MG PO TABS
25.0000 mg | ORAL_TABLET | Freq: Every day | ORAL | Status: DC
  Administered 2022-01-10: 25 mg via ORAL
  Filled 2022-01-09 (×2): qty 1

## 2022-01-09 NOTE — Plan of Care (Signed)
PT alert and oriented X4; able to make needs known. No s/s distress. Denies pain. Education provided for Pulmonary Emboli, air exchange, management of disease, diet. Uses call bell appropriately. VSS.  Problem: Education: Goal: Ability to demonstrate management of disease process will improve Outcome: Progressing Goal: Ability to verbalize understanding of medication therapies will improve Outcome: Progressing Goal: Individualized Educational Video(s) Outcome: Progressing   Problem: Activity: Goal: Capacity to carry out activities will improve Outcome: Progressing   Problem: Cardiac: Goal: Ability to achieve and maintain adequate cardiopulmonary perfusion will improve Outcome: Progressing   Problem: Education: Goal: Knowledge of risk factors and measures for prevention of condition will improve Outcome: Progressing   Problem: Coping: Goal: Psychosocial and spiritual needs will be supported Outcome: Progressing   Problem: Respiratory: Goal: Will maintain a patent airway Outcome: Progressing Goal: Complications related to the disease process, condition or treatment will be avoided or minimized Outcome: Progressing   Problem: Education: Goal: Knowledge of General Education information will improve Description: Including pain rating scale, medication(s)/side effects and non-pharmacologic comfort measures Outcome: Progressing   Problem: Health Behavior/Discharge Planning: Goal: Ability to manage health-related needs will improve Outcome: Progressing   Problem: Clinical Measurements: Goal: Ability to maintain clinical measurements within normal limits will improve Outcome: Progressing Goal: Will remain free from infection Outcome: Progressing Goal: Diagnostic test results will improve Outcome: Progressing Goal: Respiratory complications will improve Outcome: Progressing Goal: Cardiovascular complication will be avoided Outcome: Progressing   Problem: Activity: Goal:  Risk for activity intolerance will decrease Outcome: Progressing   Problem: Nutrition: Goal: Adequate nutrition will be maintained Outcome: Progressing   Problem: Coping: Goal: Level of anxiety will decrease Outcome: Progressing   Problem: Elimination: Goal: Will not experience complications related to bowel motility Outcome: Progressing Goal: Will not experience complications related to urinary retention Outcome: Progressing   Problem: Pain Managment: Goal: General experience of comfort will improve Outcome: Progressing   Problem: Safety: Goal: Ability to remain free from injury will improve Outcome: Progressing   Problem: Skin Integrity: Goal: Risk for impaired skin integrity will decrease Outcome: Progressing

## 2022-01-09 NOTE — Progress Notes (Signed)
PROGRESS NOTE    Russell Baldwin  WNU:[id number] DOB: [birth year] DOA: 01/05/2022 PCP: Pcp, No  52 y.o. male with a hx of essential hypertension, HLD, ischemic cardiomyopathy, coronary artery disease status post STEMI with PCI and DES to his proximal RCA in 2016. EF 40-45%, G2 DD, mild mitral valve regurgitation, had a cath 9/22 which noted single-vessel CAD, 100% stenosis of mid RCA with collaterals that was managed medically presented to the ED with worsening shortness of breath, abdominal wall tightness and leg swelling for 3 to 4 days, history of PND and orthopnea and some pleuritic chest pain with cough. -He has been off his medications for a few months, has not seen a cardiologist last year In the ED hypertensive blood pressure 174/131, chest x-ray with pulmonary edema, creatinine 1.2, BNP 2539, troponin 163, COVID-positive -1/6, CTA chest positive for bilateral pulmonary embolism, pulmonary infarct -Echo with drop in EF to 20-25% with severe RV dysfunction, cardiology consulting -Improving on diuretics  Subjective: -Feels much better, breathing considerably better, still with swelling  Assessment and Plan:  Acute on chronic systolic CHF BiV failure -Known ischemic cardiomyopathy with EF of 40%, repeat echo with EF down to 20-25%, severely reduced RV function and severe LVH -Recent poor compliance with diuretics and follow-up -Urine output inaccurate initially, now 3.3 L negative, weight down 17lbs -Continue IV Lasix 1 more day, still with 1+ edema -Cards, no plan for ischemic eval at this time -Continue Farxiga, Aldactone, losartan, BMP in a.m. -TOC working on medication assistance, will need meds sent to [facility], follow-up arranged  CAD -Prior RCA stent 2016 -Cath 9/22 with 100% mid RCA stenosis with collaterals, managed medically -Continue aspirin, Plavix, statin, beta-blocker -Cards consulting, see discussion above, significant drop in EF  Acute bilateral PE -Could be in the  setting of COVID, patient reports Covid Symptoms over a month ago -On IV heparin, changed to Xarelto  SARS COVID-19 infection -No recent symptoms, patient reportedly had symptoms in late November -Discontinued Paxlovid after 4-day course, ?interaction with anticoagulation -No hypoxia, CT chest without evidence of COVID-pneumonia  Essential hypertension - Coreg, Aldactone, Lasix   DVT prophylaxis: Eliquis Code Status: Full code Family Communication: None present Disposition Plan: Home tomorrow  Consultants: Cardiology   Procedures:   Antimicrobials:    Objective: Vitals:   01/08/22 2018 01/09/22 0014 01/09/22 0619 01/09/22 1030  BP: 120/68 109/69 120/82 120/83  Pulse: 77 69 72 79  Resp:      Temp: 98.6 F (37 C) 98.4 F (36.9 C) 98.7 F (37.1 C) 99.3 F (37.4 C)  TempSrc: Oral Oral Oral Oral  SpO2: 91% 96% 100% 98%  Weight:   74.3 kg   Height:        Intake/Output Summary (Last 24 hours) at 01/09/2022 1207 Last data filed at 01/09/2022 0816 Gross per 24 hour  Intake 840 ml  Output 1700 ml  Net -860 ml   Filed Weights   01/07/22 0605 01/08/22 0600 01/09/22 0619  Weight: 79.6 kg 77.2 kg 74.3 kg    Examination:  General exam: Pleasant young male sitting up in bed, AAOx3, no distress HEENT: Positive JVD CVS: S1-S2, regular rhythm Lungs: Decreased breath sounds at the bases Abdomen: Soft, nontender, bowel sounds present Extremities: 1+ edema  Skin: No rashes Psychiatry:  Mood & affect appropriate.     Data Reviewed:   CBC: Recent Labs  Lab 01/05/22 2116 01/06/22 0220 01/06/22 0326 01/07/22 0039 01/08/22 0048 01/09/22 0041  WBC 8.4 9.5 10.7* 12.7* 12.2* 12.0*  NEUTROABS 7.1  --   --   --   --   --   HGB 10.6* 10.5* 10.5* 9.1* 9.2* 8.8*  HCT 35.5* 34.8* 34.9* 30.3* 30.5* 29.0*  MCV 70.2* 70.3* 69.8* 69.7* 69.3* 68.2*  PLT 318 286 300 279 305 306   Basic Metabolic Panel: Recent Labs  Lab 01/05/22 2116 01/06/22 0220 01/06/22 0326  01/07/22 0039 01/08/22 0048 01/09/22 0041  NA 135  --  135 134* 130* 130*  K 3.7  --  3.6 3.4* 3.8 4.0  CL 102  --  99 98 96* 98  CO2 23  --  27 27 26 26   GLUCOSE 112*  --  141* 110* 138* 128*  BUN 13  --  13 17 22* 21*  CREATININE 1.29* 1.30* 1.36* 1.26* 1.43* 1.24  CALCIUM 8.6*  --  8.6* 8.2* 8.2* 8.3*   GFR: Estimated Creatinine Clearance: 73.6 mL/min (by C-G formula based on SCr of 1.24 mg/dL). Liver Function Tests: Recent Labs  Lab 01/05/22 2116 01/06/22 0326  AST 60* 54*  ALT 88* 86*  ALKPHOS 54 56  BILITOT 1.6* 1.6*  PROT 6.2* 6.3*  ALBUMIN 3.2* 3.2*   No results for input(s): "LIPASE", "AMYLASE" in the last 168 hours. No results for input(s): "AMMONIA" in the last 168 hours. Coagulation Profile: Recent Labs  Lab 01/06/22 0326  INR 1.3*   Cardiac Enzymes: No results for input(s): "CKTOTAL", "CKMB", "CKMBINDEX", "TROPONINI" in the last 168 hours. BNP (last 3 results) No results for input(s): "PROBNP" in the last 8760 hours. HbA1C: No results for input(s): "HGBA1C" in the last 72 hours.  CBG: Recent Labs  Lab 01/07/22 0744 01/08/22 0616 01/09/22 0614  GLUCAP 90 152* 101*   Lipid Profile: No results for input(s): "CHOL", "HDL", "LDLCALC", "TRIG", "CHOLHDL", "LDLDIRECT" in the last 72 hours. Thyroid Function Tests: No results for input(s): "TSH", "T4TOTAL", "FREET4", "T3FREE", "THYROIDAB" in the last 72 hours.  Anemia Panel: Recent Labs    01/08/22 0048 01/09/22 0041  FERRITIN 92 97   Urine analysis:    Component Value Date/Time   COLORURINE YELLOW 05/23/2010 1009   APPEARANCEUR CLEAR 05/23/2010 1009   LABSPEC >=1.030 08/27/2012 1820   PHURINE 6.0 08/27/2012 1820   GLUCOSEU NEGATIVE 08/27/2012 1820   HGBUR NEGATIVE 08/27/2012 1820   BILIRUBINUR NEGATIVE 08/27/2012 1820   KETONESUR NEGATIVE 08/27/2012 1820   PROTEINUR 30 (A) 08/27/2012 1820   UROBILINOGEN 0.2 08/27/2012 1820   NITRITE NEGATIVE 08/27/2012 1820   LEUKOCYTESUR NEGATIVE  08/27/2012 1820   Sepsis Labs: @LABRCNTIP (procalcitonin:4,lacticidven:4)  ) Recent Results (from the past 240 hour(s))  Resp panel by RT-PCR (RSV, Flu A&B, Covid) Anterior Nasal Swab     Status: Abnormal   Collection Time: 01/05/22  9:11 PM   Specimen: Anterior Nasal Swab  Result Value Ref Range Status   SARS Coronavirus 2 by RT PCR POSITIVE (A) NEGATIVE Final    Comment: (NOTE) SARS-CoV-2 target nucleic acids are DETECTED.  The SARS-CoV-2 RNA is generally detectable in upper respiratory specimens during the acute phase of infection. Positive results are indicative of the presence of the identified virus, but do not rule out bacterial infection or co-infection with other pathogens not detected by the test. Clinical correlation with patient history and other diagnostic information is necessary to determine patient infection status. The expected result is Negative.  Fact Sheet for Patients:  Fact Sheet for Healthcare Providers: 03/06/22  This test is not yet approved or cleared by the [employer] and  has been authorized for detection and/or diagnosis of SARS-CoV-2 by FDA under an Emergency Use Authorization (EUA).  This EUA will remain in effect (meaning this test can be used) for the duration of  the COVID-19 declaration under Section 564(b)(1) of the A ct, 21 U.S.C. section 360bbb-3(b)(1), unless the authorization is terminated or revoked sooner.     Influenza A by PCR NEGATIVE NEGATIVE Final   Influenza B by PCR NEGATIVE NEGATIVE Final    Comment: (NOTE) The Xpert Xpress SARS-CoV-2/FLU/RSV plus assay is intended as an aid in the diagnosis of influenza from Nasopharyngeal swab specimens and should not be used as a sole basis for treatment. Nasal washings and aspirates are unacceptable for Xpert Xpress SARS-CoV-2/FLU/RSV testing.  Fact Sheet for  Patients: EntrepreneurPulse.com.au  Fact Sheet for Healthcare Providers: IncredibleEmployment.be  This test is not yet approved or cleared by the [place] FDA and has been authorized for detection and/or diagnosis of SARS-CoV-2 by FDA under an Emergency Use Authorization (EUA). This EUA will remain in effect (meaning this test can be used) for the duration of the COVID-19 declaration under Section 564(b)(1) of the Act, 21 U.S.C. section 360bbb-3(b)(1), unless the authorization is terminated or revoked.     Resp Syncytial Virus by PCR NEGATIVE NEGATIVE Final    Comment: (NOTE) Fact Sheet for Patients: EntrepreneurPulse.com.au  Fact Sheet for Healthcare Providers: IncredibleEmployment.be  This test is not yet approved or cleared by the [place] FDA and has been authorized for detection and/or diagnosis of SARS-CoV-2 by FDA under an Emergency Use Authorization (EUA). This EUA will remain in effect (meaning this test can be used) for the duration of the COVID-19 declaration under Section 564(b)(1) of the Act, 21 U.S.C. section 360bbb-3(b)(1), unless the authorization is terminated or revoked.  Performed at [facility], [place] [address] [place], [place] [address]      Radiology Studies: No results found.   Scheduled Meds:  apixaban  5 mg Oral BID   Followed by   Derrill Memo ON 01/10/2022] apixaban  10 mg Oral BID   Followed by   Derrill Memo ON 01/15/2022] apixaban  5 mg Oral BID   aspirin  81 mg Oral Daily   atorvastatin  80 mg Oral q1800   carvedilol  12.5 mg Oral BID WC   empagliflozin  10 mg Oral Daily   furosemide  40 mg Intravenous BID   [START ON 01/10/2022] losartan  25 mg Oral Daily   spironolactone  25 mg Oral Daily   Continuous Infusions:   LOS: 3 days    Time spent: 80min    Domenic Polite, MD [facility]   01/09/2022, 12:07 PM

## 2022-01-09 NOTE — Progress Notes (Signed)
Cardiologist:  Oneal  Subjective:  Denies SSCP, palpitations or Dyspnea   Objective:  Vitals:   01/08/22 1832 01/08/22 2018 01/09/22 0014 01/09/22 0619  BP: 111/74 120/68 109/69 120/82  Pulse: 82 77 69 72  Resp:      Temp:  98.6 F (37 C) 98.4 F (36.9 C) 98.7 F (37.1 C)  TempSrc:  Oral Oral Oral  SpO2:  91% 96% 100%  Weight:    74.3 kg  Height:        Intake/Output from previous day:  Intake/Output Summary (Last 24 hours) at 01/09/2022 0902 Last data filed at 01/09/2022 0816 Gross per 24 hour  Intake 1060 ml  Output 1700 ml  Net -640 ml    Physical Exam: Affect appropriate Black male in no distress  HEENT: normal Neck supple with no adenopathy JVP normal no bruits no thyromegaly Lungs clear with no wheezing and good diaphragmatic motion Heart:  S1/S2 no murmur, no rub, gallop or click PMI normal Abdomen: benighn, BS positve, no tenderness, no AAA no bruit.  No HSM or HJR Distal pulses intact with no bruits No edema Neuro non-focal Skin warm and dry No muscular weakness   Lab Results: Basic Metabolic Panel: Recent Labs    01/08/22 0048 01/09/22 0041  NA 130* 130*  K 3.8 4.0  CL 96* 98  CO2 26 26  GLUCOSE 138* 128*  BUN 22* 21*  CREATININE 1.43* 1.24  CALCIUM 8.2* 8.3*   Liver Function Tests: No results for input(s): "AST", "ALT", "ALKPHOS", "BILITOT", "PROT", "ALBUMIN" in the last 72 hours.  No results for input(s): "LIPASE", "AMYLASE" in the last 72 hours. CBC: Recent Labs    01/08/22 0048 01/09/22 0041  WBC 12.2* 12.0*  HGB 9.2* 8.8*  HCT 30.5* 29.0*  MCV 69.3* 68.2*  PLT 305 306   Cardiac Enzymes: No results for input(s): "CKTOTAL", "CKMB", "CKMBINDEX", "TROPONINI" in the last 72 hours. BNP: Invalid input(s): "POCBNP" D-Dimer: No results for input(s): "DDIMER" in the last 72 hours.   Thyroid Function Tests: No results for input(s): "TSH", "T4TOTAL", "T3FREE", "THYROIDAB" in the last 72 hours.  Invalid input(s):  "FREET3"  Anemia Panel: Recent Labs    01/09/22 0041  FERRITIN 97    Imaging: No results found.  Cardiac Studies:  ECG: SR bi atrial enlargement inferior lateral T wave changes    Telemetry:  NSR 01/09/2022   Echo: EF 20-25% RV dysfunction   Medications:    apixaban  5 mg Oral BID   Followed by   Derrill Memo ON 01/10/2022] apixaban  10 mg Oral BID   Followed by   Derrill Memo ON 01/15/2022] apixaban  5 mg Oral BID   aspirin  81 mg Oral Daily   atorvastatin  80 mg Oral q1800   carvedilol  12.5 mg Oral BID WC   empagliflozin  10 mg Oral Daily   furosemide  40 mg Intravenous BID   losartan  25 mg Oral Daily   spironolactone  25 mg Oral Daily        Assessment/Plan:   PE:   explains chest tightness and dyspnea ? 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

## 2022-01-09 NOTE — Progress Notes (Signed)
   Heart Failure Stewardship Pharmacist Progress Note   PCP: Pcp, No PCP-Cardiologist: Evalina Field, MD    HPI:  52 yo M with PMH of HTN, HLD, ICM, CAD s/p STEMI with PCI and DES to RCA.  He presented to the ED on 1/5 with shortness of breath, cough, LE edema, and orthopnea. Reports constant chest tightness. CXR with cardiomegaly and edema. CTA confirmed bilateral PE, cardiomegaly, pulmonary edema, and small right pleural effusion. ECHO 1/6 showed LVEF 20-25% (was 40-45% in 09/2020), global hypokinesis, moderate to severe LVH, RV severely reduced. DVT scan negative. Also found to be COVID+, likely culprit for provoked PE.   Current HF Medications: Diuretic: furosemide 40 mg IV BID Beta Blocker: carvedilol 12.5 mg BID ACE/ARB/ARNI: losartan 25 mg daily MRA: spironolactone 25 mg daily SGLT2i: Jardiance 10 mg daily  Prior to admission HF Medications: None - not taking any prescribed medications  Pertinent Lab Values: Serum creatinine 1.24, BUN 21, Potassium 4.0, Sodium 130, BNP 2538.5, A1c 6.6   Vital Signs: Weight: 163 lbs (admission weight: 178 lbs) Blood pressure: 120/80s  Heart rate: 70s  I/O: -0.5L yesterday; net -3.1L  Medication Assistance / Insurance Benefits Check: Does the patient have prescription insurance?  Yes Type of insurance plan: [place]:  [facility] Is the patient willing to use [place] at discharge? Yes Is the patient willing to transition their outpatient pharmacy to utilize a [facility] outpatient pharmacy?   Pending    Assessment: 1. Acute on chronic systolic CHF (LVEF 37-16%), known ICM. NYHA class III symptoms. - Continue furosemide 40 mg IV BID. Strict I/Os and daily weights. Keep K>4 - Continue carvedilol 12.5 mg BID  - Continue losartan 25 mg daily (holding dose today) - Continue spironolactone 25 mg daily - Continue Jardiance 10 mg daily   Plan: 1) Medication changes  recommended at this time: - Agree with changes  2) Patient assistance: - Has commercial insurance, can use monthly copay cards - Jardiance/Farxiga card only pay up to $175 per month, this will leave a copay >$400 left each month - Plan to use 30 day free card for Jardiance at discharge and then will enroll in patient assistance program during [facility] visit. - Xarelto switched to Eliquis - copay card for Xarelto has a monthly max benefit while Eliquis card only has an annual limit.   3)  Education  - To be completed prior to discharge  [name], PharmD, BCPS Heart Failure Stewardship Pharmacist Phone [phone]

## 2022-01-10 ENCOUNTER — Other Ambulatory Visit (HOSPITAL_COMMUNITY): Payer: Self-pay

## 2022-01-10 DIAGNOSIS — I2699 Other pulmonary embolism without acute cor pulmonale: Secondary | ICD-10-CM | POA: Diagnosis present

## 2022-01-10 DIAGNOSIS — N179 Acute kidney failure, unspecified: Secondary | ICD-10-CM | POA: Diagnosis present

## 2022-01-10 DIAGNOSIS — U071 COVID-19: Secondary | ICD-10-CM | POA: Diagnosis present

## 2022-01-10 DIAGNOSIS — I2609 Other pulmonary embolism with acute cor pulmonale: Secondary | ICD-10-CM | POA: Diagnosis not present

## 2022-01-10 DIAGNOSIS — R072 Precordial pain: Secondary | ICD-10-CM

## 2022-01-10 DIAGNOSIS — I5020 Unspecified systolic (congestive) heart failure: Secondary | ICD-10-CM

## 2022-01-10 DIAGNOSIS — I2510 Atherosclerotic heart disease of native coronary artery without angina pectoris: Secondary | ICD-10-CM | POA: Diagnosis not present

## 2022-01-10 DIAGNOSIS — I5023 Acute on chronic systolic (congestive) heart failure: Secondary | ICD-10-CM | POA: Diagnosis not present

## 2022-01-10 DIAGNOSIS — I10 Essential (primary) hypertension: Secondary | ICD-10-CM | POA: Diagnosis not present

## 2022-01-10 LAB — BASIC METABOLIC PANEL
Anion gap: 9 (ref 5–15)
BUN: 22 mg/dL — ABNORMAL HIGH (ref 6–20)
CO2: 26 mmol/L (ref 22–32)
Calcium: 8.6 mg/dL — ABNORMAL LOW (ref 8.9–10.3)
Chloride: 95 mmol/L — ABNORMAL LOW (ref 98–111)
Creatinine, Ser: 1.18 mg/dL (ref 0.61–1.24)
GFR, Estimated: >60 mL/min (ref >60)
Glucose, Bld: 104 mg/dL — ABNORMAL HIGH (ref 70–99)
Potassium: 4.4 mmol/L (ref 3.5–5.1)
Sodium: 130 mmol/L — ABNORMAL LOW (ref 135–145)

## 2022-01-10 LAB — TROPONIN I (HIGH SENSITIVITY)
Troponin I (High Sensitivity): 109 ng/L (ref <18)
Troponin I (High Sensitivity): 106 ng/L (ref <18)

## 2022-01-10 LAB — GLUCOSE, CAPILLARY: Glucose-Capillary: 105 mg/dL — ABNORMAL HIGH (ref 70–99)

## 2022-01-10 MED ORDER — EMPAGLIFLOZIN 10 MG PO TABS
10.0000 mg | ORAL_TABLET | Freq: Every day | ORAL | 0 refills | Status: AC
  Filled 2022-01-10: qty 30, 30d supply, fill #0

## 2022-01-10 MED ORDER — SPIRONOLACTONE 25 MG PO TABS
25.0000 mg | ORAL_TABLET | Freq: Every day | ORAL | 0 refills | Status: DC
  Filled 2022-01-10: qty 30, 30d supply, fill #0

## 2022-01-10 MED ORDER — ATORVASTATIN CALCIUM 80 MG PO TABS
80.0000 mg | ORAL_TABLET | Freq: Every day | ORAL | 0 refills | Status: DC
  Filled 2022-01-10: qty 30, 30d supply, fill #0

## 2022-01-10 MED ORDER — APIXABAN 5 MG PO TABS
ORAL_TABLET | ORAL | 0 refills | Status: DC
  Filled 2022-01-10: qty 69, 30d supply, fill #0

## 2022-01-10 MED ORDER — FUROSEMIDE 20 MG PO TABS
20.0000 mg | ORAL_TABLET | Freq: Every day | ORAL | 0 refills | Status: DC
  Filled 2022-01-10: qty 30, 30d supply, fill #0

## 2022-01-10 MED ORDER — LOSARTAN POTASSIUM 25 MG PO TABS
25.0000 mg | ORAL_TABLET | Freq: Every day | ORAL | 0 refills | Status: DC
  Filled 2022-01-10: qty 30, 30d supply, fill #0

## 2022-01-10 MED ORDER — CARVEDILOL 12.5 MG PO TABS
12.5000 mg | ORAL_TABLET | Freq: Two times a day (BID) | ORAL | 0 refills | Status: DC
  Filled 2022-01-10: qty 60, 30d supply, fill #0

## 2022-01-10 MED ORDER — FUROSEMIDE 20 MG PO TABS: 20.0000 mg | ORAL_TABLET | Freq: Every day | ORAL | Status: DC

## 2022-01-10 MED ORDER — NITROGLYCERIN 0.4 MG SL SUBL
SUBLINGUAL_TABLET | SUBLINGUAL | Status: AC
  Filled 2022-01-10: qty 1

## 2022-01-10 MED ORDER — NITROGLYCERIN 0.4 MG SL SUBL
0.4000 mg | SUBLINGUAL_TABLET | SUBLINGUAL | 0 refills | Status: DC | PRN
  Filled 2022-01-10: qty 25, 7d supply, fill #0

## 2022-01-10 MED ORDER — APIXABAN 5 MG PO TABS: 5.0000 mg | ORAL_TABLET | Freq: Two times a day (BID) | ORAL | 2 refills | Status: DC

## 2022-01-10 MED ORDER — ASPIRIN 81 MG PO CHEW
81.0000 mg | CHEWABLE_TABLET | Freq: Every day | ORAL | 0 refills | Status: AC
  Filled 2022-01-10: qty 30, 30d supply, fill #0

## 2022-01-10 NOTE — Progress Notes (Signed)
Cardiologist:  Oneal  Subjective:  Some chest pain last night on awakening Atypical sharp and right sided    Objective:  Vitals:   01/10/22 0049 01/10/22 0050 01/10/22 0521 01/10/22 0815  BP:    109/79  Pulse: 69 70  79  Resp:    18  Temp:  99 F (37.2 C) 98.8 F (37.1 C) 98.2 F (36.8 C)  TempSrc:  Oral Oral Oral  SpO2: 93% 93%  100%  Weight:   72.9 kg   Height:        Intake/Output from previous day:  Intake/Output Summary (Last 24 hours) at 01/10/2022 9735 Last data filed at 01/10/2022 0800 Gross per 24 hour  Intake 358 ml  Output 1475 ml  Net -1117 ml    Physical Exam: Affect appropriate Black male in no distress  HEENT: normal Neck supple with no adenopathy JVP normal no bruits no thyromegaly Lungs clear with no wheezing and good diaphragmatic motion Heart:  S1/S2 no murmur, no rub, gallop or click PMI normal Abdomen: benighn, BS positve, no tenderness, no AAA no bruit.  No HSM or HJR Distal pulses intact with no bruits No edema Neuro non-focal Skin warm and dry No muscular weakness   Lab Results: Basic Metabolic Panel: Recent Labs    01/09/22 0041 01/10/22 0045  NA 130* 130*  K 4.0 4.4  CL 98 95*  CO2 26 26  GLUCOSE 128* 104*  BUN 21* 22*  CREATININE 1.24 1.18  CALCIUM 8.3* 8.6*   Liver Function Tests: No results for input(s): "AST", "ALT", "ALKPHOS", "BILITOT", "PROT", "ALBUMIN" in the last 72 hours.  No results for input(s): "LIPASE", "AMYLASE" in the last 72 hours. CBC: Recent Labs    01/08/22 0048 01/09/22 0041  WBC 12.2* 12.0*  HGB 9.2* 8.8*  HCT 30.5* 29.0*  MCV 69.3* 68.2*  PLT 305 306   Cardiac Enzymes: No results for input(s): "CKTOTAL", "CKMB", "CKMBINDEX", "TROPONINI" in the last 72 hours. BNP: Invalid input(s): "POCBNP" D-Dimer: No results for input(s): "DDIMER" in the last 72 hours.   Thyroid Function Tests: No results for input(s): "TSH", "T4TOTAL", "T3FREE", "THYROIDAB" in the last 72 hours.  Invalid  input(s): "FREET3"  Anemia Panel: Recent Labs    01/09/22 0041  FERRITIN 97    Imaging: No results found.  Cardiac Studies:  ECG: SR bi atrial enlargement inferior lateral T wave changes    Telemetry:  NSR 01/10/2022   Echo: EF 20-25% RV dysfunction   Medications:    apixaban  10 mg Oral BID   Followed by   Derrill Memo ON 01/15/2022] apixaban  5 mg Oral BID   aspirin  81 mg Oral Daily   atorvastatin  80 mg Oral q1800   carvedilol  12.5 mg Oral BID WC   empagliflozin  10 mg Oral Daily   furosemide  40 mg Intravenous BID   losartan  25 mg Oral Daily   spironolactone  25 mg Oral Daily        Assessment/Plan:   PE:   explains chest tightness and dyspnea ? 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

## 2022-01-10 NOTE — Progress Notes (Signed)
Milderd Meager MD notified regarding patient's chest pain event from previous note.

## 2022-01-10 NOTE — Assessment & Plan Note (Signed)
Patient sp revascularization RCA stent in 2016, follow up coronary angiography on 09.22 with 100% mid RCS stenosis with collaterals.  Recommended medical therapy.  No acute coronary syndrome on this admission.   Continue medical therapy with aspirin and statin.  Hold on clopidogrel to decrease risk of bleeding in the setting of PE treatment with apixaban.

## 2022-01-10 NOTE — Assessment & Plan Note (Signed)
Hypertensive emergency on admission.  Patient had diuresis and resumed on antihypertensive medications.  At the time of his discharge his blood pressure is controlled and clinically euvolemic.

## 2022-01-10 NOTE — Assessment & Plan Note (Signed)
Patient was placed on IV heparin for anticoagulation for bilateral segmental and subsegmental pulmonary embolism. He has decreased RV systolic function.  Patient remained hemodynamically stable and was transitioned to apixaban with good toleration.   Considered provoked Pulmonary embolism in the setting of SARS COVID 19 infection. He will need anticoagulation for 6 months.

## 2022-01-10 NOTE — Discharge Summary (Signed)
Physician Discharge Summary   Patient: Russell Baldwin MRN: [id number] DOB: 05-26-1970  Admit date:     01/05/2022  Discharge date: 01/10/22  Discharge Physician: Jimmy Picket Tateanna Bach   PCP: Pcp, No   Recommendations at discharge:    Patient has been placed on guideline directed therapy for heart failure with carvedilol, losartan, empagliflozin and spironolactone.  Diuresis with furosemide.  Follow up renal function in 7 days. Anticoagulation with apixaban for pulmonary embolism, possible provoked by COVID 19 infection. Duration anticoagulation 6 months.  Follow up with cardiology as outpatient, will need to be evaluated for possible AICD after being on goal directed medical therapy for at least 3 months. Continue aspirin and statin for coronary artery disease. (Discontinue clopidogrel to decrease risk of bleeding).   Discharge Diagnoses: Principal Problem:   Acute on chronic systolic heart failure (HCC) Active Problems:   Primary hypertension   COVID-19 virus infection   Acute pulmonary embolism (HCC)   Coronary artery disease  Resolved Problems:   * No resolved hospital problems. [facility] Course: Russell Baldwin was admitted to the hospital with the working diagnosis of decompensated heart failure.   52 yo male with the past medical history of coronary artery disease, hypertension, dyslipidemia, and heart failure who presented with dyspnea and lower extremity edema. Patient had worsening dyspnea, PND, orthopnea and lower extremity along with cough and chest discomfort. Noted not compliant with his medications for about one year. On his initial physical examination his blood pressure was 174/131, HR 107, RR 18 and 02 saturation 97% on room air. Lungs with rales bilaterally, heart with S1 and S2 present and rhythmic, no gallops, abdomen with no distention, poistive 3+ lower extremity edema.   Na 135, K 3.7 Cl 102 bicarbonate 23, glucose 112, bun 13, cr 1.29, AST 60. ALT 88.  BNP  2,538  High sensitive troponin 163 and 164  Wbc 8.4 hgb 10.6 plt 318  SARS covid 19 positive.   Chest radiograph with cardiomegaly, bilateral interstitial infiltrates, hilar vascular congestion and cephalization of the vasculature.   CT chest with bilateral ground glass opacities, more predominant at the right base.  Positive for multifocal acute pulmonary emboli, segmental to subsegmental, with infarcts in the right middle and lower lobes.   EKG 104 bom, normal axis, normal intervals, sins rhythm with no significant ST segment changes, negative T wave in V6 (not new).   Echocardiogram showed decreased LV systolic function.   He was placed on diuretic therapy with improvement on his symptoms.  Placed on Paxlovid for 4 days.     Assessment and Plan: * Acute on chronic systolic heart failure (HCC) Echocardiogram with decreased LV systolic function EF 20 to 25%, global hypokinesis, moderate to severe LV hypertrophy, RV systolic function with severe reduction, RV cavity with moderate dilatation, RVSP 37,3.   Patient was placed on aggressive diuresis, negative fluid balance was achieved, -4.346 ml, with significant improvement in his symptoms.   Biventricular failure. Acute on chronic core pulmonale. Pulmonary hypertension.   Patient has been resumed on heart failure regimen with carvedilol, losartan, spironolactone and empagliflozin. Continue diuresis with furosemide.  Follow up with cardiology as outpatient.   He has been advices to be compliant with medical therapy to improve heart function and prevent re hospitalizations.   Primary hypertension Hypertensive emergency on admission.  Patient had diuresis and resumed on antihypertensive medications.  At the time of his discharge his blood pressure is controlled and clinically euvolemic.   Coronary artery disease Patient  sp revascularization RCA stent in 2016, follow up coronary angiography on 09.22 with 100% mid RCS stenosis with  collaterals.  Recommended medical therapy.  No acute coronary syndrome on this admission.   Continue medical therapy with aspirin and statin.  Hold on clopidogrel to decrease risk of bleeding in the setting of PE treatment with apixaban.   Acute pulmonary embolism (HCC) Patient was placed on IV heparin for anticoagulation for bilateral segmental and subsegmental pulmonary embolism. He has decreased RV systolic function.  Patient remained hemodynamically stable and was transitioned to apixaban with good toleration.   Considered provoked Pulmonary embolism in the setting of SARS COVID 19 infection. He will need anticoagulation for 6 months.    COVID-19 virus infection Patient was placed on Paxlovid for 4 days (interaction with anticoagulation). He had no clinical sings of viral pneumonia.  His respiratory symptoms were assumed to be related acute cardiogenic pulmonary edema.   AKI (acute kidney injury) ([place]) Patient responded well to diuresis, at the time of his discharge his serum cr is 1.18 from a peak of 1,43, his discharge K is 4,4 and serum bicarbonate at 26. Na 130.   Plan to continue diuresis with spironolactone, empagliflozine, and furosemide. Follow up renal function as outpatient in 7 days.         Consultants: cardiology  Procedures performed: none  Disposition: Home Diet recommendation:  Cardiac diet DISCHARGE MEDICATION: Allergies as of 01/10/2022   No Known Allergies      Medication List     STOP taking these medications    amLODipine 5 MG tablet Commonly known as: NORVASC   clopidogrel 75 MG tablet Commonly known as: PLAVIX   Farxiga 10 MG Tabs tablet Generic drug: dapagliflozin propanediol   isosorbide mononitrate 30 MG 24 hr tablet Commonly known as: IMDUR   lisinopril 40 MG tablet Commonly known as: ZESTRIL       TAKE these medications    apixaban 5 MG Tabs tablet Commonly known as: ELIQUIS 01/10/22 to 01/14/22:  Take 2 tablets  (10mg ) twice daily  1/15 and on: Decrease to 1 tablet (5mg ) twice daily Start taking on: January 15, 2022   aspirin 81 MG chewable tablet Chew 1 tablet (81 mg total) by mouth daily.   atorvastatin 80 MG tablet Commonly known as: LIPITOR Take 1 tablet (80 mg total) by mouth daily at 6 PM.   carvedilol 12.5 MG tablet Commonly known as: COREG Take 1 tablet (12.5 mg total) by mouth 2 (two) times daily with a meal. What changed:  medication strength how much to take   empagliflozin 10 MG Tabs tablet Commonly known as: JARDIANCE Take 1 tablet (10 mg total) by mouth daily. Start taking on: January 11, 2022   furosemide 20 MG tablet Commonly known as: LASIX Take 1 tablet (20 mg total) by mouth daily. Start taking on: January 11, 2022   losartan 25 MG tablet Commonly known as: COZAAR Take 1 tablet (25 mg total) by mouth daily. Start taking on: January 11, 2022   nitroGLYCERIN 0.4 MG SL tablet Commonly known as: NITROSTAT Place 1 tablet (0.4 mg total) under the tongue every 5 (five) minutes as needed for chest pain.   spironolactone 25 MG tablet Commonly known as: ALDACTONE Take 1 tablet (25 mg total) by mouth daily. Start taking on: January 11, 2022        Follow-up Information     Primary Care at [facility]. Go on 02/05/2022.   Specialty: Family Medicine Why: @10 :00am Contact  information: [address], [address] [place]        Chalmers Guest, MD. Call.   Specialties: Cardiology, Internal Medicine Why: Follow up with cardiology per MD instruciton Contact information: [address] [place] [address] [phone]         [facility]. Go in 9 day(s).   Specialty: Cardiology Why: Hospital follow up on 01/19/22 @ 3 pm PLEASE bring a current medication list to appointment FREE valet parking, Entrance C, off [employer] information: [address] [id number] [place] [phone]               Discharge Exam: Filed Weights   01/08/22 0600 01/09/22 0619 01/10/22 0521  Weight: 77.2 kg 74.3 kg 72.9 kg   BP 109/79 (BP Location: Right Arm)   Pulse 79   Temp 98.2 F (36.8 C) (Oral)   Resp 18   Ht 5\' 10"  (1.778 m)   Wt 72.9 kg   SpO2 100%   BMI 23.06 kg/m   Patient is feeling better, dyspnea and edema have improved, no chest pain.   Neurology awake and alert ENT with no pallor Cardiovascular with S1 and S2 present and rhythmic with no gallops, rubs or murmurs No JVD No lower extremity edema Respiratory with no rales or wheezing Abdomen with no distention   Condition at discharge: stable  The results of significant diagnostics from this hospitalization (including imaging, microbiology, ancillary and laboratory) are listed below for reference.   Imaging Studies: VAS [place] LOWER EXTREMITY VENOUS (DVT)  Result Date: 01/07/2022  Lower Venous DVT Study Patient Name:  Russell Baldwin  Date of Exam:   01/06/2022 Medical Rec #: [id number]       Accession #:    [id number] Date of Birth: 10-24-1970       Patient Gender: M Patient Age:   78 years Exam Location:  [facility] Procedure:      VAS [place] LOWER EXTREMITY VENOUS (DVT) Referring Phys: Myles Rosenthal --------------------------------------------------------------------------------  Indications: Pulmonary embolism. Covid-19. Elevated d-dimer.  Limitations: Poor ultrasound/tissue interface. Performing Technologist: Darlin Coco RDMS, RVT  Examination Guidelines: A complete evaluation includes B-mode imaging, spectral Doppler, color Doppler, and power Doppler as needed of all accessible portions of each vessel. Bilateral testing is considered an integral part of a complete examination. Limited examinations for reoccurring indications may be performed as noted. The reflux portion of the exam is performed with the patient in reverse Trendelenburg.   +---------+---------------+---------+-----------+----------+--------------+ RIGHT    CompressibilityPhasicitySpontaneityPropertiesThrombus Aging +---------+---------------+---------+-----------+----------+--------------+ CFV      Full           No       Yes                                 +---------+---------------+---------+-----------+----------+--------------+ SFJ      Full                                                        +---------+---------------+---------+-----------+----------+--------------+ FV Prox  Full                                                        +---------+---------------+---------+-----------+----------+--------------+  FV Mid   Full                                                        +---------+---------------+---------+-----------+----------+--------------+ FV DistalFull                                                        +---------+---------------+---------+-----------+----------+--------------+ PFV      Full                                                        +---------+---------------+---------+-----------+----------+--------------+ POP      Full           No       Yes                                 +---------+---------------+---------+-----------+----------+--------------+ PTV      Full                                                        +---------+---------------+---------+-----------+----------+--------------+ PERO     Full                                                        +---------+---------------+---------+-----------+----------+--------------+   +---------+---------------+---------+-----------+----------+--------------+ LEFT     CompressibilityPhasicitySpontaneityPropertiesThrombus Aging +---------+---------------+---------+-----------+----------+--------------+ CFV      Full           No       Yes                                  +---------+---------------+---------+-----------+----------+--------------+ SFJ      Full                                                        +---------+---------------+---------+-----------+----------+--------------+ FV Prox  Full                                                        +---------+---------------+---------+-----------+----------+--------------+ FV Mid   Full                                                        +---------+---------------+---------+-----------+----------+--------------+  FV DistalFull                                                        +---------+---------------+---------+-----------+----------+--------------+ PFV      Full                                                        +---------+---------------+---------+-----------+----------+--------------+ POP      Full           No       Yes                                 +---------+---------------+---------+-----------+----------+--------------+ PTV      Full                                                        +---------+---------------+---------+-----------+----------+--------------+ PERO     Full                                                        +---------+---------------+---------+-----------+----------+--------------+     Summary: RIGHT: - There is no evidence of deep vein thrombosis in the lower extremity.  - No cystic structure found in the popliteal fossa.  LEFT: - There is no evidence of deep vein thrombosis in the lower extremity.  - No cystic structure found in the popliteal fossa.  *See table(s) above for measurements and observations. Electronically signed by Gerarda Fraction on 01/07/2022 at 7:54:27 AM.    Final    ECHOCARDIOGRAM COMPLETE  Result Date: 01/06/2022    ECHOCARDIOGRAM REPORT   Patient Name:   Russell Baldwin Date of Exam: 01/06/2022 Medical Rec #:  [id number]      Height:       70.0 in Accession #:    [id number]     Weight:       180.0 lb Date of  Birth:  1970/08/15      BSA:          1.996 m Patient Age:    51 years       BP:           134/103 mmHg Patient Gender: M              HR:           86 bpm. Exam Location:  Inpatient Procedure: 2D Echo and Intracardiac Opacification Agent Indications:    CHF  History:        Patient has prior history of Echocardiogram examinations, most                 recent 09/19/2020. CHF, CAD; Risk Factors:Hypertension and                 Dyslipidemia.  Sonographer:  Harvie Junior Referring Phys: [id number] [place]  1. Left ventricular ejection fraction, by estimation, is 20 to 25%. The left ventricle has severely decreased function. The left ventricle demonstrates global hypokinesis. The left ventricular internal cavity size was mildly to moderately dilated. Moderate to severe left ventricular hypertrophy. Left ventricular diastolic function could not be evaluated.  2. Right ventricular systolic function is severely reduced. The right ventricular size is moderately enlarged. There is mildly elevated pulmonary artery systolic pressure. The estimated right ventricular systolic pressure is 02.7 mmHg.  3. Left atrial size was mildly dilated.  4. Right atrial size was mild to moderately dilated.  5. The mitral valve is normal in structure. Trivial mitral valve regurgitation. No evidence of mitral stenosis.  6. The aortic valve is tricuspid. Aortic valve regurgitation is not visualized. No aortic stenosis is present.  7. The inferior vena cava is dilated in size with <50% respiratory variability, suggesting right atrial pressure of 15 mmHg. Comparison(s): Changes from prior study are noted. LVEF worsened to 20-25% now compared to 40-45% from 2022. FINDINGS  Left Ventricle: Left ventricular ejection fraction, by estimation, is 20 to 25%. The left ventricle has severely decreased function. The left ventricle demonstrates global hypokinesis. Definity contrast agent was given IV to delineate the left ventricular  endocardial borders. The left ventricular internal cavity size was mildly to moderately dilated. Moderate to severe left ventricular hypertrophy. Left ventricular diastolic function could not be evaluated due to nondiagnostic images. Left ventricular diastolic function could not be evaluated. Right Ventricle: The right ventricular size is moderately enlarged. No increase in right ventricular wall thickness. Right ventricular systolic function is severely reduced. There is mildly elevated pulmonary artery systolic pressure. The tricuspid regurgitant velocity is 2.36 m/s, and with an assumed right atrial pressure of 15 mmHg, the estimated right ventricular systolic pressure is 74.1 mmHg. Left Atrium: Left atrial size was mildly dilated. Right Atrium: Right atrial size was mild to moderately dilated. Pericardium: Trivial pericardial effusion is present. Mitral Valve: The mitral valve is normal in structure. Trivial mitral valve regurgitation. No evidence of mitral valve stenosis. Tricuspid Valve: The tricuspid valve is normal in structure. Tricuspid valve regurgitation is trivial. No evidence of tricuspid stenosis. Aortic Valve: The aortic valve is tricuspid. Aortic valve regurgitation is not visualized. No aortic stenosis is present. Aortic valve mean gradient measures 3.7 mmHg. Aortic valve peak gradient measures 6.9 mmHg. Aortic valve area, by VTI measures 1.35 cm. Pulmonic Valve: The pulmonic valve was normal in structure. Pulmonic valve regurgitation is mild to moderate. No evidence of pulmonic stenosis. Aorta: The aortic root and ascending aorta are structurally normal, with no evidence of dilitation. Venous: The inferior vena cava is dilated in size with less than 50% respiratory variability, suggesting right atrial pressure of 15 mmHg. IAS/Shunts: No atrial level shunt detected by color flow Doppler.  LEFT VENTRICLE PLAX 2D LVIDd:         5.80 cm      Diastology LVIDs:         4.90 cm      LV e' medial:  3.70  cm/s LV PW:         1.50 cm      LV e' lateral: 9.57 cm/s LV IVS:        1.50 cm LVOT diam:     2.00 cm LV SV:         31 LV SV Index:   15 LVOT Area:     3.14 cm  LV Volumes (MOD) LV vol d, MOD A2C: 282.5 ml LV vol d, MOD A4C: 260.5 ml LV vol s, MOD A2C: 219.5 ml LV vol s, MOD A4C: 176.0 ml LV SV MOD A2C:     63.0 ml LV SV MOD A4C:     260.5 ml LV SV MOD BP:      77.2 ml RIGHT VENTRICLE RV Basal diam:  4.70 cm RV Mid diam:    3.90 cm RV S prime:     8.38 cm/s TAPSE (M-mode): 1.0 cm LEFT ATRIUM             Index        RIGHT ATRIUM           Index LA diam:        4.00 cm 2.00 cm/m   RA Area:     22.30 cm LA Vol (A2C):   88.7 ml 44.44 ml/m  RA Volume:   70.10 ml  35.12 ml/m LA Vol (A4C):   58.3 ml 29.21 ml/m LA Biplane Vol: 71.4 ml 35.77 ml/m  AORTIC VALVE                    PULMONIC VALVE AV Area (Vmax):    1.87 cm     PV Vmax:          1.07 m/s AV Area (Vmean):   1.67 cm     PV Peak grad:     4.6 mmHg AV Area (VTI):     1.35 cm     PR End Diast Vel: 3.51 msec AV Vmax:           131.00 cm/s AV Vmean:          86.967 cm/s AV VTI:            0.229 m AV Peak Grad:      6.9 mmHg AV Mean Grad:      3.7 mmHg LVOT Vmax:         78.10 cm/s LVOT Vmean:        46.100 cm/s LVOT VTI:          0.098 m LVOT/AV VTI ratio: 0.43  AORTA Ao Root diam: 3.20 cm Ao Asc diam:  3.60 cm MR Peak grad: 28.6 mmHg   TRICUSPID VALVE MR Vmax:      267.50 cm/s TR Peak grad:   22.3 mmHg                           TR Vmax:        236.00 cm/s                            SHUNTS                           Systemic VTI:  0.10 m                           Systemic Diam: 2.00 cm Vishnu Priya Mallipeddi Electronically signed by Lorelee Cover Mallipeddi Signature Date/Time: 01/06/2022/1:15:15 PM    Final    CT Angio Chest Pulmonary Embolism (PE) W or WO Contrast  Result Date: 01/06/2022 CLINICAL DATA:  Shortness of breath with pulmonary embolism suspected EXAM: CT ANGIOGRAPHY CHEST WITH CONTRAST TECHNIQUE: Multidetector CT imaging of the chest was  performed using the  standard protocol during bolus administration of intravenous contrast. Multiplanar CT image reconstructions and MIPs were obtained to evaluate the vascular anatomy. RADIATION DOSE REDUCTION: This exam was performed according to the departmental dose-optimization program which includes automated exposure control, adjustment of the mA and/or kV according to patient size and/or use of iterative reconstruction technique. CONTRAST:  4mL OMNIPAQUE IOHEXOL 350 MG/ML SOLN COMPARISON:  None Available. FINDINGS: Cardiovascular: Satisfactory opacification of the pulmonary arteries. Posterior segment pulmonary embolism to the left upper lobe. Non filling of right lower lobe subsegmental branches where there are changes of pulmonary infarct is well. Medial segmental right middle lobe pulmonary embolism with pulmonary infarct. Cannot accurately assess the RV to LV ratio given non opacification of the left ventricle, there is multi chamber cardiac enlargement at baseline is well. Non-opacified aorta Mediastinum/Nodes: No mass or adenopathy Lungs/Pleura: Ground-glass opacity in the right middle and lower lobes associated with emboli. Interlobular septal thickening diffusely with small right pleural effusion Upper Abdomen: Hepatic venous reflux attributed to elevated right heart pressures, possibly chronic given the cardiac size. Musculoskeletal: No acute finding Review of the MIP images confirms the above findings. Critical Value/emergent results were called by telephone at the time of interpretation on 01/06/2022 at 7:40 am to provider Dr. Broadus John, who verbally acknowledged these results. IMPRESSION: 1. Positive for multifocal acute pulmonary emboli, segmental to subsegmental, with infarcts in the right middle and lower lobes. 2. Cardiomegaly with mild interstitial pulmonary edema and small right pleural effusion. Electronically Signed   By: Jorje Guild M.D.   On: 01/06/2022 07:40   DG Chest 2  View  Result Date: 01/05/2022 CLINICAL DATA:  Shortness of breath EXAM: CHEST - 2 VIEW COMPARISON:  09/19/2020 FINDINGS: Transverse diameter of heart is increased. Central pulmonary vessels are more prominent. Increased interstitial markings are seen in parahilar regions and lower lung fields. Linear patchy densities are seen in right lower lung field. There is blunting of right lateral CP angle. There is no pneumothorax. IMPRESSION: Cardiomegaly. Central pulmonary vessels are more prominent suggesting CHF. Increased markings are seen in right lower lung fields suggesting atelectasis/pneumonia. Part of this finding may be due to asymmetric pulmonary edema. Small right pleural effusion is seen. Electronically Signed   By: Elmer Picker M.D.   On: 01/05/2022 21:27    Microbiology: Results for orders placed or performed during the hospital encounter of 01/05/22  Resp panel by RT-PCR (RSV, Flu A&B, Covid) Anterior Nasal Swab     Status: Abnormal   Collection Time: 01/05/22  9:11 PM   Specimen: Anterior Nasal Swab  Result Value Ref Range Status   SARS Coronavirus 2 by RT PCR POSITIVE (A) NEGATIVE Final    Comment: (NOTE) SARS-CoV-2 target nucleic acids are DETECTED.  The SARS-CoV-2 RNA is generally detectable in upper respiratory specimens during the acute phase of infection. Positive results are indicative of the presence of the identified virus, but do not rule out bacterial infection or co-infection with other pathogens not detected by the test. Clinical correlation with patient history and other diagnostic information is necessary to determine patient infection status. The expected result is Negative.  Fact Sheet for Patients: EntrepreneurPulse.com.au  Fact Sheet for Healthcare Providers: IncredibleEmployment.be  This test is not yet approved or cleared by the [place] FDA and  has been authorized for detection and/or diagnosis of SARS-CoV-2  by FDA under an Emergency Use Authorization (EUA).  This EUA will remain in effect (meaning this test can be used) for the duration of  the  COVID-19 declaration under Section 564(b)(1) of the A ct, 21 U.S.C. section 360bbb-3(b)(1), unless the authorization is terminated or revoked sooner.     Influenza A by PCR NEGATIVE NEGATIVE Final   Influenza B by PCR NEGATIVE NEGATIVE Final    Comment: (NOTE) The Xpert Xpress SARS-CoV-2/FLU/RSV plus assay is intended as an aid in the diagnosis of influenza from Nasopharyngeal swab specimens and should not be used as a sole basis for treatment. Nasal washings and aspirates are unacceptable for Xpert Xpress SARS-CoV-2/FLU/RSV testing.  Fact Sheet for Patients: EntrepreneurPulse.com.au  Fact Sheet for Healthcare Providers: IncredibleEmployment.be  This test is not yet approved or cleared by the [place] FDA and has been authorized for detection and/or diagnosis of SARS-CoV-2 by FDA under an Emergency Use Authorization (EUA). This EUA will remain in effect (meaning this test can be used) for the duration of the COVID-19 declaration under Section 564(b)(1) of the Act, 21 U.S.C. section 360bbb-3(b)(1), unless the authorization is terminated or revoked.     Resp Syncytial Virus by PCR NEGATIVE NEGATIVE Final    Comment: (NOTE) Fact Sheet for Patients: EntrepreneurPulse.com.au  Fact Sheet for Healthcare Providers: IncredibleEmployment.be  This test is not yet approved or cleared by the [place] FDA and has been authorized for detection and/or diagnosis of SARS-CoV-2 by FDA under an Emergency Use Authorization (EUA). This EUA will remain in effect (meaning this test can be used) for the duration of the COVID-19 declaration under Section 564(b)(1) of the Act, 21 U.S.C. section 360bbb-3(b)(1), unless the authorization is terminated or revoked.  Performed at [facility], [place] [address] [place], [place] [address]     Labs: CBC: Recent Labs  Lab 01/05/22 2116 01/06/22 0220 01/06/22 0326 01/07/22 0039 01/08/22 0048 01/09/22 0041  WBC 8.4 9.5 10.7* 12.7* 12.2* 12.0*  NEUTROABS 7.1  --   --   --   --   --   HGB 10.6* 10.5* 10.5* 9.1* 9.2* 8.8*  HCT 35.5* 34.8* 34.9* 30.3* 30.5* 29.0*  MCV 70.2* 70.3* 69.8* 69.7* 69.3* 68.2*  PLT 318 286 300 279 305 [id number]   Basic Metabolic Panel: Recent Labs  Lab 01/06/22 0326 01/07/22 0039 01/08/22 0048 01/09/22 0041 01/10/22 0045  NA 135 134* 130* 130* 130*  K 3.6 3.4* 3.8 4.0 4.4  CL 99 98 96* 98 95*  CO2 27 27 26 26 26   GLUCOSE 141* 110* 138* 128* 104*  BUN 13 17 22* 21* 22*  CREATININE 1.36* 1.26* 1.43* 1.24 1.18  CALCIUM 8.6* 8.2* 8.2* 8.3* 8.6*   Liver Function Tests: Recent Labs  Lab 01/05/22 2116 01/06/22 0326  AST 60* 54*  ALT 88* 86*  ALKPHOS 54 56  BILITOT 1.6* 1.6*  PROT 6.2* 6.3*  ALBUMIN 3.2* 3.2*   CBG: Recent Labs  Lab 01/07/22 0744 01/08/22 0616 01/09/22 0614 01/10/22 0723  GLUCAP 90 152* 101* 105*    Discharge time spent: greater than 30 minutes.  Signed: Tawni Millers, MD [facility] 01/10/2022

## 2022-01-10 NOTE — Progress Notes (Signed)
Patient c/o 8/10 chest pain and tightness around 0010. EKG performed. Given 3 does of nitro SL with some relief. Now pain 6/10. O2 sat 94 % RA.. see flowsheet for vitals. Denies pain in the arms, back, diaphoresis.patient asked for graham crackers, peanut butter and orange juice. Will continue to monitor.

## 2022-01-10 NOTE — Assessment & Plan Note (Signed)
Patient was placed on Paxlovid for 4 days (interaction with anticoagulation). He had no clinical sings of viral pneumonia.  His respiratory symptoms were assumed to be related acute cardiogenic pulmonary edema.

## 2022-01-10 NOTE — TOC Transition Note (Signed)
Transition of Care [facility]) - CM/SW Discharge Note   Patient Details  Name: Russell Baldwin MRN: [id number] Date of Birth: 23-Feb-1970  Transition of Care [facility]) CM/SW Contact:  [name], RN Phone Number: 01/10/2022, 11:30 AM   Clinical Narrative:    Patient is for dc today, [name] with working on patient assistance for HF medications.  He has no other needs. He has transport home.      Barriers to Discharge: Continued Medical Work up   Patient Goals and CMS Choice      Discharge Placement                         Discharge Plan and Services Additional resources added to the After Visit Summary for                                       Social Determinants of Health (SDOH) Interventions SDOH Screenings   Food Insecurity: No Food Insecurity (01/06/2022)  Housing: Low Risk  (01/08/2022)  Transportation Needs: No Transportation Needs (01/06/2022)  Utilities: Not At Risk (01/06/2022)  Alcohol Screen: Low Risk  (01/08/2022)  Financial Resource Strain: Medium Risk (01/08/2022)  Tobacco Use: Low Risk  (01/08/2022)     Readmission Risk Interventions     No data to display

## 2022-01-10 NOTE — Hospital Course (Signed)
Russell Baldwin was admitted to the hospital with the working diagnosis of decompensated heart failure.   52 yo male with the past medical history of coronary artery disease, hypertension, dyslipidemia, and heart failure who presented with dyspnea and lower extremity edema. Patient had worsening dyspnea, PND, orthopnea and lower extremity along with cough and chest discomfort. Noted not compliant with his medications for about one year. On his initial physical examination his blood pressure was 174/131, HR 107, RR 18 and 02 saturation 97% on room air. Lungs with rales bilaterally, heart with S1 and S2 present and rhythmic, no gallops, abdomen with no distention, poistive 3+ lower extremity edema.   Na 135, K 3.7 Cl 102 bicarbonate 23, glucose 112, bun 13, cr 1.29, AST 60. ALT 88.  BNP 2,538  High sensitive troponin 163 and 164  Wbc 8.4 hgb 10.6 plt 318  SARS covid 19 positive.   Chest radiograph with cardiomegaly, bilateral interstitial infiltrates, hilar vascular congestion and cephalization of the vasculature.   CT chest with bilateral ground glass opacities, more predominant at the right base.  Positive for multifocal acute pulmonary emboli, segmental to subsegmental, with infarcts in the right middle and lower lobes.   EKG 104 bom, normal axis, normal intervals, sins rhythm with no significant ST segment changes, negative T wave in V6 (not new).   Echocardiogram showed decreased LV systolic function.   He was placed on diuretic therapy with improvement on his symptoms.  Placed on Paxlovid for 4 days.

## 2022-01-10 NOTE — Assessment & Plan Note (Signed)
Patient responded well to diuresis, at the time of his discharge his serum cr is 1.18 from a peak of 1,43, his discharge K is 4,4 and serum bicarbonate at 26. Na 130.   Plan to continue diuresis with spironolactone, empagliflozine, and furosemide. Follow up renal function as outpatient in 7 days.

## 2022-01-10 NOTE — Progress Notes (Signed)
   Heart Failure Stewardship Pharmacist Progress Note   PCP: Pcp, No PCP-Cardiologist: Evalina Field, MD    HPI:  52 yo M with PMH of HTN, HLD, ICM, CAD s/p STEMI with PCI and DES to RCA.  He presented to the ED on 1/5 with shortness of breath, cough, LE edema, and orthopnea. Reports constant chest tightness. CXR with cardiomegaly and edema. CTA confirmed bilateral PE, cardiomegaly, pulmonary edema, and small right pleural effusion. ECHO 1/6 showed LVEF 20-25% (was 40-45% in 09/2020), global hypokinesis, moderate to severe LVH, RV severely reduced. DVT scan negative. Also found to be COVID+, likely culprit for provoked PE.   Discharge HF Medications: Diuretic: furosemide 20 mg daily Beta Blocker: carvedilol 12.5 mg BID ACE/ARB/ARNI: losartan 25 mg daily MRA: spironolactone 25 mg daily SGLT2i: Jardiance 10 mg daily  Prior to admission HF Medications: None - not taking any prescribed medications  Pertinent Lab Values: Serum creatinine 1.18, BUN 22, Potassium 4.4, Sodium 130, BNP 2538.5, A1c 6.6   Vital Signs: Weight: 160 lbs (admission weight: 178 lbs) Blood pressure: 110/70s  Heart rate: 70s  I/O: -0.7L yesterday; net -4.3L  Medication Assistance / Insurance Benefits Check: Does the patient have prescription insurance?  Yes Type of insurance plan: [place]:  Prior to admission outpatient pharmacy[facility] Is the patient willing to use [facility] at discharge? Yes Is the patient willing to transition their outpatient pharmacy to utilize a [facility] outpatient pharmacy?   Pending    Assessment: 1. Acute on chronic systolic CHF (LVEF 30-09%), known ICM. NYHA class II symptoms. - Agree with furosemide 20 mg daily for discharge. Strict I/Os and daily weights. Keep K>4 - Continue carvedilol 12.5 mg BID  - Continue losartan 25 mg daily, consider optimizing to [facility] as outpatient - Continue spironolactone 25 mg daily - Continue Jardiance 10 mg  daily   Plan: 1) Medication changes recommended at this time: - Discharge today  2) Patient assistance: - Has commercial insurance, can use monthly copay cards - Jardiance/Farxiga card only pay up to $175 per month, this will leave a copay >$400 left each month - Plan to use 30 day free card for Jardiance at discharge and then will enroll in patient assistance program during [facility] visit. - Xarelto switched to Eliquis - copay card for Xarelto has a monthly max benefit while Eliquis card only has an annual limit. Copay card provided to patient.    3)  Education  - Patient has been educated on current HF medications and potential additions to HF medication regimen - Patient verbalizes understanding that over the next few months, these medication doses may change and more medications may be added to optimize HF regimen - Patient has been educated on basic disease state pathophysiology and goals of therapy   [name], PharmD, BCPS Heart Failure Stewardship Pharmacist Phone [phone]

## 2022-01-10 NOTE — Assessment & Plan Note (Signed)
Echocardiogram with decreased LV systolic function EF 20 to 25%, global hypokinesis, moderate to severe LV hypertrophy, RV systolic function with severe reduction, RV cavity with moderate dilatation, RVSP 37,3.   Patient was placed on aggressive diuresis, negative fluid balance was achieved, -4.346 ml, with significant improvement in his symptoms.   Biventricular failure. Acute on chronic core pulmonale. Pulmonary hypertension.   Patient has been resumed on heart failure regimen with carvedilol, losartan, spironolactone and empagliflozin. Continue diuresis with furosemide.  Follow up with cardiology as outpatient.   He has been advices to be compliant with medical therapy to improve heart function and prevent re hospitalizations.

## 2022-01-18 ENCOUNTER — Telehealth (HOSPITAL_COMMUNITY): Payer: Self-pay

## 2022-01-18 ENCOUNTER — Other Ambulatory Visit (HOSPITAL_COMMUNITY): Payer: Self-pay

## 2022-01-18 NOTE — Telephone Encounter (Signed)
Called to confirm Heart & Vascular Transitions of Care appointment at 01/19/22. Patient reminded to bring all medications and pill box organizer with them. Confirmed patient has transportation. Gave directions, instructed to utilize valet parking.  Confirmed appointment prior to ending call.   

## 2022-01-18 NOTE — Progress Notes (Signed)
   Heart [facility] Vascular Center Transitions of Care Clinic Heart Failure Pharmacist Encounter  PCP: Pcp, No PCP-Cardiologist: Evalina Field, MD  HPI:  52 yo male with PMH significant for HTN, HLD, CAD, CHF, CAD s/p STEMI w/ PCI and DES to RCA, and h/o PE. He presented to the ED on 01/05/2022 with complaints of SOB, chest tightness, orthopnea and LE edema that began about 3-4 days prior. Reported this happened around Thanksgiving then resolved independently. He was also found to have a provoked PE, likely d/t COVID19. CXR showed cardiomegaly and pulmonary edema. ECHO on 01/06/2022 showed EF of 20-25% (previously 40-45% in 2022). LV had severely decreased function, global hypokinesis, moderate to severe LV hypertrophy and RV function severely reduced with mild elevated pulmonary artery systolic pressure.   Today, Russell Baldwin presents to the [facility] for follow up. He denies SOB or edema at this time. He endorses medication adherence.   HF Medications: Diuretic: furosemide 20mg  once daily Beta Blocker: carvedilol 12.5mg  BID ACE/ARB/ARNI: losartan 25mg  once daily MRA: spironolactone 25mg  once daily SGLT2i: Jardiance 10mg  once daily Other: Imdur 15mg  once daily   Has the patient been experiencing any side effects to the medications prescribed?  yes  Does the patient have any problems obtaining medications due to transportation or finances?   no  Understanding of regimen: good Understanding of indications: good Potential of compliance: good Patient understands to avoid NSAIDs. Patient understands to avoid decongestants.   Vital Signs: Weight: 162 lbs (discharge weight: 160 lbs) Blood pressure: 140/90 Heart rate: 67   Medication Assistance / Insurance Benefits Check: Does the patient have prescription insurance?  Yes Type of insurance plan: [employer]   Does the patient qualify for medication assistance through manufacturers or grants?   Pending Eligible grants and/or patient  assistance programs: pending Medication assistance applications in progress: Ferne Coe Medication assistance applications approved: pending Approved medication assistance renewals will be completed by: Dr. Daniel Nones  Outpatient Pharmacy:  [facility] Was the [facility] pharmacy used to supply discharge medications? yes  If TOC pharmacy was used, were the refills transferred out to current pharmacy yet? No refills provided at discharge; refills provided at this visit Is the patient willing to transition their outpatient pharmacy to utilize a [facility] outpatient pharmacy with or without mail order?   No  Assessment: 1) Chronic systolic CHF (EF 95-62%), due to NICM. NYHA class II symptoms. - Continue Jardiance 10mg  once daily -Continue carvedilol 12.5mg  BID -Continue spironolactone 25mg  once daily - Euvolemic on exam. With initiation of Entresto, would benefit from a dose decrease of his diuretic therapy.  -Would benefit from switching his current RAAS therapy for ARNI.  Plan: 1) Medication changes: -Initiate Entresto 24/26mg  BID -Discontinue losartan -Decrease furosemide to 20mg  three times weekly  2) Patient Assistance: - Filled out PAP for Jardiance and Entresto. Patient instructed to return with tax documents. -Provided 30-day trial card for Entresto  3) Follow up: - Next appointment with cardiology on 01/31/2022  Maryan Puls, PharmD PGY-1 [facility] Pharmacy Resident

## 2022-01-19 ENCOUNTER — Ambulatory Visit (HOSPITAL_COMMUNITY)
Admit: 2022-01-19 | Discharge: 2022-01-19 | Disposition: A | Discharge status: Home / Self Care | Payer: No Typology Code available for payment source | Attending: Cardiology | Admitting: Cardiology

## 2022-01-19 ENCOUNTER — Other Ambulatory Visit (HOSPITAL_COMMUNITY): Payer: Self-pay

## 2022-01-19 VITALS — BP 140/90 | HR 67 | Wt 162.0 lb

## 2022-01-19 DIAGNOSIS — Z79899 Other long term (current) drug therapy: Secondary | ICD-10-CM | POA: Insufficient documentation

## 2022-01-19 DIAGNOSIS — I255 Ischemic cardiomyopathy: Secondary | ICD-10-CM | POA: Diagnosis not present

## 2022-01-19 DIAGNOSIS — Z8249 Family history of ischemic heart disease and other diseases of the circulatory system: Secondary | ICD-10-CM | POA: Diagnosis not present

## 2022-01-19 DIAGNOSIS — I509 Heart failure, unspecified: Secondary | ICD-10-CM

## 2022-01-19 DIAGNOSIS — I110 Hypertensive heart disease with heart failure: Secondary | ICD-10-CM | POA: Insufficient documentation

## 2022-01-19 DIAGNOSIS — I2510 Atherosclerotic heart disease of native coronary artery without angina pectoris: Secondary | ICD-10-CM | POA: Insufficient documentation

## 2022-01-19 DIAGNOSIS — I252 Old myocardial infarction: Secondary | ICD-10-CM | POA: Diagnosis not present

## 2022-01-19 DIAGNOSIS — Z7984 Long term (current) use of oral hypoglycemic drugs: Secondary | ICD-10-CM | POA: Insufficient documentation

## 2022-01-19 DIAGNOSIS — I5022 Chronic systolic (congestive) heart failure: Secondary | ICD-10-CM | POA: Diagnosis not present

## 2022-01-19 DIAGNOSIS — Z955 Presence of coronary angioplasty implant and graft: Secondary | ICD-10-CM | POA: Diagnosis not present

## 2022-01-19 DIAGNOSIS — Z7901 Long term (current) use of anticoagulants: Secondary | ICD-10-CM | POA: Insufficient documentation

## 2022-01-19 LAB — BASIC METABOLIC PANEL
Anion gap: 8 (ref 5–15)
BUN: 8 mg/dL (ref 6–20)
CO2: 26 mmol/L (ref 22–32)
Calcium: 8.9 mg/dL (ref 8.9–10.3)
Chloride: 102 mmol/L (ref 98–111)
Creatinine, Ser: 0.96 mg/dL (ref 0.61–1.24)
GFR, Estimated: >60 mL/min (ref >60)
Glucose, Bld: 92 mg/dL (ref 70–99)
Potassium: 4.6 mmol/L (ref 3.5–5.1)
Sodium: 136 mmol/L (ref 135–145)

## 2022-01-19 LAB — BRAIN NATRIURETIC PEPTIDE: B Natriuretic Peptide: 402.3 pg/mL — ABNORMAL HIGH (ref 0.0–100.0)

## 2022-01-19 MED ORDER — SPIRONOLACTONE 25 MG PO TABS: 25.0000 mg | ORAL_TABLET | Freq: Every day | ORAL | 3 refills | Status: DC

## 2022-01-19 MED ORDER — ATORVASTATIN CALCIUM 80 MG PO TABS: 80.0000 mg | ORAL_TABLET | Freq: Every day | ORAL | 6 refills | Status: DC

## 2022-01-19 MED ORDER — FUROSEMIDE 20 MG PO TABS: 20.0000 mg | ORAL_TABLET | ORAL | 3 refills | Status: DC

## 2022-01-19 MED ORDER — CARVEDILOL 12.5 MG PO TABS: 12.5000 mg | ORAL_TABLET | Freq: Two times a day (BID) | ORAL | 3 refills | Status: DC

## 2022-01-19 MED ORDER — NITROGLYCERIN 0.4 MG SL SUBL: 0.4000 mg | SUBLINGUAL_TABLET | SUBLINGUAL | 1 refills | Status: AC | PRN

## 2022-01-19 MED ORDER — APIXABAN 5 MG PO TABS: 5.0000 mg | ORAL_TABLET | Freq: Two times a day (BID) | ORAL | 3 refills | Status: DC

## 2022-01-19 MED ORDER — ENTRESTO 24-26 MG PO TABS: 1.0000 | ORAL_TABLET | Freq: Two times a day (BID) | ORAL | 3 refills | Status: DC

## 2022-01-19 NOTE — Patient Instructions (Addendum)
Medication Changes:  STOP Losartan  START Entresto 24/26 mg Twice daily   DECREASE Furosemide to 20 mg 3 times a week (Every Monday, Wednesday, and Friday)  Lab Work:  Labs done today, your results will be available in MyChart, we will contact you for abnormal readings.  Testing/Procedures:  none  Referrals:  You have been referred to follow-up with our Advanced Heart Failure Clinic  Special Instructions // Education:  Do the following things EVERYDAY: Weigh yourself in the morning before breakfast. Write it down and keep it in a log. Take your medicines as prescribed Eat low salt foods--Limit salt (sodium) to 2000 mg per day.  Stay as active as you can everyday Limit all fluids for the day to less than 2 liters   Follow-Up in: Thank you for allowing [place] to provider your heart failure care after your recent hospitalization. Please follow-up with our [facility] on Wednesday 1/31 at 10:00 AM    At the [facility], you and your health needs are our priority. We have a designated team specialized in the treatment of Heart Failure. This Care Team includes your primary Heart Failure Specialized Cardiologist (physician), Advanced Practice Providers (APPs- Physician Assistants and Nurse Practitioners), and Pharmacist who all work together to provide you with the care you need, when you need it.   You may see any of the following providers on your designated Care Team at your next follow up:  Dr. Glori Bickers Dr. Loralie Champagne Dr. Roxana Hires, NP Lyda Jester, [place] [facility], [place] Forestine Na, NP Audry Riles, PharmD   Please be sure to bring in all your medications bottles to every appointment.   Need to Contact us:  If you have any questions or concerns before your next appointment please send [place] a message through [place] or call our office at [phone].    TO LEAVE A MESSAGE FOR THE NURSE  SELECT OPTION 2, PLEASE LEAVE A MESSAGE INCLUDING: YOUR NAME DATE OF BIRTH CALL BACK NUMBER REASON FOR CALL**this is important as we prioritize the call backs  YOU WILL RECEIVE A CALL BACK THE SAME DAY AS LONG AS YOU CALL BEFORE 4:00 PM

## 2022-01-19 NOTE — Progress Notes (Signed)
Medication Samples have been provided to the patient.  Drug name: Jardiance       Strength: 10 mg        Qty: 4  LOT: [id number]  Exp.Date: 03/2024  Dosing instructions: Take 1 tab daily  The patient has been instructed regarding the correct time, dose, and frequency of taking this medication, including desired effects and most common side effects.   Carollee Nussbaumer 4:00 PM 01/19/2022

## 2022-01-19 NOTE — Progress Notes (Addendum)
HEART & VASCULAR TRANSITION OF CARE CONSULT NOTE     Referring Physician: Dr. Cathlean Sauer  Primary Care: Pcp, No  Primary Cardiologist: Evalina Field, MD  HPI: Referred to clinic by Dr. Cathlean Sauer for heart failure consultation.   52 y/o AAM w/ h/o CAD s/p acute inferior wall STEMI in 2012, at the age of 33, c/b transient heart block necessitating TVP. Emergent cath showed severe single vessel disease w/ pRCA occlusion, treated w/ PCI + DES.  EF then was 45-50%.  Echo 03/2014 EF 50-55%, GIDD, RV normal   Admitted 9/22 w/ NSTEMI and CHF. Echo showed EF back down, 40-45%, RV normal. LHC showed severe single-vessel disease with CTO of the proximal to mid RCA just after previously placed stent -> distal vessel fills via bridging collaterals and right to right collaterals and large draping left coronary system, but no left-to-right collaterals. After reviewing images with Dr. Sophronia Simas and Dr. Hillery Hunter as well as Dr. 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 Vessel not intervened on. Medical management elected.   Pt recently admitted 1/24 w/ a/c CHF, COVID 19 infection and acute PE. LE Venous Dopplers were negative for DVT. Echo showed further reduction in EF and biventricular dysfunction.  EF down to 20-25%, RV severely reduced, mod enlarged, RVSP 37 mmHg. Mod-severe LVH also noted. Chest CT no mediastinal adenopathy. LHC deferred given absence of chest pain and only minimal Hs trop elevation, peaked at 109. He was treated w/ Eliquis, diuresed w/ IV Lasix and placed on GDMT. Discharged on 1/10. Referred to [facility] clinic.   He presents today for f/u. Reports feeling well. Denies resting dyspnea. No dyspnea w/ ALDs but has been cautious not to over-exert. He denies CP and palpitations. No LEE, orthopnea/PND. Reports full med compliance. Tolerating well w/o side effects. BP 140/90. No orthostatic symptoms. No abnormal bleeding w/ Eliquis. Wt has been stable since d/c. Euvolemic  on exam.   Of note, he reports a family h/o CHF on mother's side. Mother had CHF and died from it at age 53. 14 of her brothers also had CHF.   Pt denies tobacco and ETOH use. Works in Ambulance person as a Freight forwarder for [employer]   2D Echo 1/24 EF 20-25%, mod-severe LVH, RV moderately enlarged w/ severely reduced systolic function    LHC 0/86 Coronary Diagrams  Diagnostic Dominance: Right      Prox RCA overlapping stents are 60% stenosed.   Prox RCA to Mid RCA lesion is 99% stenosed followed by Mid RCA lesion is 100% stenosed -> likely CTO with bridging and R-R collaterals   Dist RCA lesion is 30% stenosed.   LV end diastolic pressure is moderately elevated.   There is no aortic valve stenosis.   SUMMARY Severe single-vessel disease with CTO of the proximal to mid RCA just after previously placed stent -> distal vessel fills via bridging collaterals and right to right collaterals. After reviewing images with Dr. Sophronia Simas and Dr. Hillery Hunter as well as Dr. 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 ->   Large draping left coronary system, but no left-to-right collaterals. Mild-moderately elevated LVEDP of 18 mmHg.    Review of Systems: [y] = yes, [ ]  = no   General: Weight gain [ ] ; Weight loss [ ] ; Anorexia [ ] ; Fatigue [ ] ; Fever [ ] ; Chills [ ] ; Weakness [ ]   Cardiac: Chest pain/pressure [ ] ;  Resting SOB [ ] ; Exertional SOB [ ] ; Orthopnea [ ] ; Pedal Edema [ ] ; Palpitations [ ] ; Syncope [ ] ; Presyncope [ ] ; Paroxysmal nocturnal dyspnea[ ]   Pulmonary: Cough [ ] ; Wheezing[ ] ; Hemoptysis[ ] ; Sputum [ ] ; Snoring [ ]   GI: Vomiting[ ] ; Dysphagia[ ] ; Melena[ ] ; Hematochezia [ ] ; Heartburn[ ] ; Abdominal pain [ ] ; Constipation [ ] ; Diarrhea [ ] ; BRBPR [ ]   GU: Hematuria[ ] ; Dysuria [ ] ; Nocturia[ ]   Vascular: Pain in legs with walking [ ] ; Pain in feet with lying flat [ ] ; Non-healing sores [ ] ; Stroke [ ] ; TIA [ ] ; Slurred speech [  ];  Neuro: Headaches[ ] ; Vertigo[ ] ; Seizures[ ] ; Paresthesias[ ] ;Blurred vision [ ] ; Diplopia [ ] ; Vision changes [ ]   Ortho/Skin: Arthritis [ ] ; Joint pain [ ] ; Muscle pain [ ] ; Joint swelling [ ] ; Back Pain [ ] ; Rash [ ]   Psych: Depression[ ] ; Anxiety[ ]   Heme: Bleeding problems [ ] ; Clotting disorders [ ] ; Anemia [ ]   Endocrine: Diabetes [ ] ; Thyroid dysfunction[ ]    Past Medical History:  Diagnosis Date   Coronary artery disease    a. 2012: inferior STEMI s/p DES to RCA,  b. 2016 inferior STEMI for late ISR s/p DES to RCA, c. LHC 09/2020 severe single vessel CAD with 100% stenosis of mid RCA with bridging and collaterals (felt to be CTO - treated medically)   HLD (hyperlipidemia)    HTN (hypertension)    Ischemic cardiomyopathy    a. LV gram EF 40% (03/2014); normal EF by echo   Prediabetes     Current Outpatient Medications  Medication Sig Dispense Refill   apixaban (ELIQUIS) 5 MG TABS tablet 01/10/22 to 01/14/22:  Take 2 tablets (10mg ) twice daily  1/15 and on: Decrease to 1 tablet (5mg ) twice daily 69 tablet 0   aspirin 81 MG chewable tablet Chew 1 tablet (81 mg total) by mouth daily. 30 tablet 0   atorvastatin (LIPITOR) 80 MG tablet Take 1 tablet (80 mg total) by mouth daily at 6 PM. 30 tablet 0   carvedilol (COREG) 12.5 MG tablet Take 1 tablet (12.5 mg total) by mouth 2 (two) times daily with a meal. 60 tablet 0   empagliflozin (JARDIANCE) 10 MG TABS tablet Take 1 tablet (10 mg total) by mouth daily. 30 tablet 0   furosemide (LASIX) 20 MG tablet Take 1 tablet (20 mg total) by mouth daily. 30 tablet 0   losartan (COZAAR) 25 MG tablet Take 1 tablet (25 mg total) by mouth daily. 30 tablet 0   nitroGLYCERIN (NITROSTAT) 0.4 MG SL tablet Place 1 tablet (0.4 mg total) under the tongue every 5 (five) minutes as needed for chest pain. 25 tablet 0   spironolactone (ALDACTONE) 25 MG tablet Take 1 tablet (25 mg total) by mouth daily. 30 tablet 0   No current facility-administered  medications for this encounter.    No Known Allergies    Social History   Socioeconomic History   Marital status: Single    Spouse name: Not on file   Number of children: 2   Years of education: Not on file   Highest education level: High school graduate  Occupational History   Occupation: Sports administrator and [employer]  Tobacco Use   Smoking status: Never   Smokeless tobacco: Never  Vaping Use   Vaping Use: Never used  Substance and Sexual Activity   Alcohol use: Yes    Comment: socially   Drug use: No  Sexual activity: Not on file  Other Topics Concern   Not on file  Social History Narrative   Not on file   Social Determinants of Health   Financial Resource Strain: Medium Risk (01/08/2022)   Overall Financial Resource Strain (CARDIA)    Difficulty of Paying Living Expenses: Somewhat hard  Food Insecurity: No Food Insecurity (01/06/2022)   Hunger Vital Sign    Worried About Running Out of Food in the Last Year: Never true    Ran Out of Food in the Last Year: Never true  Transportation Needs: No Transportation Needs (01/06/2022)   PRAPARE - Administrator, Civil Service (Medical): No    Lack of Transportation (Non-Medical): No  Physical Activity: Not on file  Stress: Not on file  Social Connections: Not on file  Intimate Partner Violence: Not At Risk (01/06/2022)   Humiliation, Afraid, Rape, and Kick questionnaire    Fear of Current or Ex-Partner: No    Emotionally Abused: No    Physically Abused: No    Sexually Abused: No      Family History  Problem Relation Age of Onset   Heart disease Mother    Diabetes Mother    Diabetes Father    Heart disease Father     Vitals:   01/19/22 1511  Weight: 73.5 kg (162 lb)    PHYSICAL EXAM: General:  Well appearing. No respiratory difficulty HEENT: normal Neck: supple. no JVD. Carotids 2+ bilat; no bruits. No lymphadenopathy or thryomegaly appreciated. Cor: PMI nondisplaced. Regular rate & rhythm. No  rubs, gallops or murmurs. Lungs: clear Abdomen: soft, nontender, nondistended. No hepatosplenomegaly. No bruits or masses. Good bowel sounds. Extremities: no cyanosis, clubbing, rash, edema Neuro: alert & oriented x 3, cranial nerves grossly intact. moves all 4 extremities w/o difficulty. Affect pleasant.  ECG: NSR 66 bpm, lateral TWI    ASSESSMENT & PLAN:  1. Chronic Systolic Heart Failure - H/o ischemic CM, EF 40% post large inferior STEMI in 2012, s/p RCA PCI - Echo 2016, EF improved 50-55%, RV nl - Echo 9/22 EF down 40-45%, RV nl>>repeat LHC w/ occluded RCA just distal to previously placed RCA stent w/ R>R collaterals (felt to be CTO, managed medically) - Echo 1/24 w/ severe biventricular dysfunction, LVEF 20-25%, RV mod enlarged and severely reduced, in setting of acute PE and acute COVID infection. Hs trop minimally elevated w/ flat trend, peaking at 109. Likely not ACS - Potential etiology for new drop in EF/BIV failure may be stressed induced/ viral from acute COVID infection. Also concern for possible familial CM (mother + 2 maternal uncles w/ CHF). Echo shows severe LVH. Has HTN but BPs have not been historically markedly high. Chest CT negative for  mediastinal adenopathy. - Will need aggressive GDMT and repeat echo in 3 months. If EF not improving will refer for cMRI and genetic testing  - [facility] to the [facility] for [facility] management  - NYHA Class II, Euvolemic on exam  - Stop Losartan  - Start Entresto 24-26 mg bid - Continue Jardiance 10 mg daily  - Continue Spironolacton 25 mg daily  - Continue Coreg 12.5 mg bid  - Reduce Lasix to 20 mg MWF    2. PE - recent diagnosis, LE venous dopplers negative for DVT,  RVSP only 37 mmHg on echo   - uncertain if cardioembolic, given severely reduced HK RV +  hypercoagulable state from acute COVID infection  - continue tx dose Eliquis - if RV remains severely reduced  in 3 months, may need to continue Eliquis indefinitely  3. CAD -  prior inferolateral STEMI 2012>>pRCA stent - NSTEMI 2022>>CTO of RCA just distal to previously placed stent w/ R>R collaterals, managed medically - denies recent CP - continue medical therapy    4. HTN - GDMT per above - adding Entresto     Referred to HFSW (PCP, Medications, Transportation, ETOH Abuse, Drug Abuse, Insurance, Financial ): No  Refer to Pharmacy: Yes  Refer to Home Health: No  Refer to Advanced Heart Failure Clinic: Yes (Dr. Gasper Lloyd)  Refer to General Cardiology: No   Follow up w/ Dr. Gasper Lloyd in 2 wks   Robbie Lis, PA-C

## 2022-01-23 ENCOUNTER — Telehealth (HOSPITAL_COMMUNITY): Payer: Self-pay | Admitting: Pharmacy Technician

## 2022-01-23 ENCOUNTER — Other Ambulatory Visit (HOSPITAL_COMMUNITY): Payer: Self-pay

## 2022-01-23 NOTE — Telephone Encounter (Signed)
Advanced Heart Failure Patient Advocate Encounter  Sent in [place] application to [employer] via fax. Of note, Entresto co-pay card will work for [employer] claim. Will hold off on sending in assistance to [employer].

## 2022-01-25 ENCOUNTER — Other Ambulatory Visit (HOSPITAL_COMMUNITY): Payer: Self-pay

## 2022-01-29 ENCOUNTER — Other Ambulatory Visit (HOSPITAL_COMMUNITY): Payer: Self-pay

## 2022-01-30 NOTE — Telephone Encounter (Signed)
Advanced Heart Failure Patient Advocate Encounter  Received notification from [place] that patient's application was denied due to having private insurance. Called and spoke with representative. Updated information on application via fax.  Will follow up.

## 2022-01-31 ENCOUNTER — Encounter (HOSPITAL_COMMUNITY): Payer: Self-pay | Admitting: Cardiology

## 2022-01-31 ENCOUNTER — Ambulatory Visit (HOSPITAL_COMMUNITY)
Admission: RE | Admit: 2022-01-31 | Discharge: 2022-01-31 | Disposition: A | Discharge status: Home / Self Care | Payer: No Typology Code available for payment source | Source: Ambulatory Visit | Attending: Cardiology | Admitting: Cardiology

## 2022-01-31 VITALS — BP 130/88 | HR 72 | Wt 165.4 lb

## 2022-01-31 DIAGNOSIS — Z7984 Long term (current) use of oral hypoglycemic drugs: Secondary | ICD-10-CM | POA: Diagnosis not present

## 2022-01-31 DIAGNOSIS — Z86711 Personal history of pulmonary embolism: Secondary | ICD-10-CM | POA: Diagnosis not present

## 2022-01-31 DIAGNOSIS — I110 Hypertensive heart disease with heart failure: Secondary | ICD-10-CM | POA: Insufficient documentation

## 2022-01-31 DIAGNOSIS — Z8616 Personal history of COVID-19: Secondary | ICD-10-CM | POA: Insufficient documentation

## 2022-01-31 DIAGNOSIS — Z7982 Long term (current) use of aspirin: Secondary | ICD-10-CM | POA: Diagnosis not present

## 2022-01-31 DIAGNOSIS — I2510 Atherosclerotic heart disease of native coronary artery without angina pectoris: Secondary | ICD-10-CM

## 2022-01-31 DIAGNOSIS — I10 Essential (primary) hypertension: Secondary | ICD-10-CM

## 2022-01-31 DIAGNOSIS — Z7901 Long term (current) use of anticoagulants: Secondary | ICD-10-CM | POA: Diagnosis not present

## 2022-01-31 DIAGNOSIS — I5022 Chronic systolic (congestive) heart failure: Secondary | ICD-10-CM | POA: Diagnosis present

## 2022-01-31 DIAGNOSIS — I252 Old myocardial infarction: Secondary | ICD-10-CM | POA: Diagnosis not present

## 2022-01-31 DIAGNOSIS — Z79899 Other long term (current) drug therapy: Secondary | ICD-10-CM | POA: Diagnosis not present

## 2022-01-31 DIAGNOSIS — I255 Ischemic cardiomyopathy: Secondary | ICD-10-CM | POA: Diagnosis not present

## 2022-01-31 LAB — CBC
HCT: 38.2 % — ABNORMAL LOW (ref 39.0–52.0)
Hemoglobin: 10.7 g/dL — ABNORMAL LOW (ref 13.0–17.0)
MCH: 20.4 pg — ABNORMAL LOW (ref 26.0–34.0)
MCHC: 28 g/dL — ABNORMAL LOW (ref 30.0–36.0)
MCV: 72.9 fL — ABNORMAL LOW (ref 80.0–100.0)
Platelets: 256 10*3/uL (ref 150–400)
RBC: 5.24 MIL/uL (ref 4.22–5.81)
RDW: 21.8 % — ABNORMAL HIGH (ref 11.5–15.5)
WBC: 3.5 10*3/uL — ABNORMAL LOW (ref 4.0–10.5)
nRBC: 0 % (ref 0.0–0.2)

## 2022-01-31 MED ORDER — ENTRESTO 49-51 MG PO TABS: 1.0000 | ORAL_TABLET | Freq: Two times a day (BID) | ORAL | 3 refills | Status: DC

## 2022-01-31 NOTE — Progress Notes (Signed)
ADVANCED HEART FAILURE CLINIC NOTE  Referring Physician: No ref. provider found  Primary Care: Pcp, No Primary Cardiologist:  HPI: Russell Baldwin is a 52 y.o. male who presents for initial visit for further evaluation and treatment of heart failure/cardiomyopathy. 52 y/o AAM w/ h/o CAD s/p acute inferior wall STEMI in 2012, at the age of 65, c/b transient heart block necessitating TVP. Emergent cath showed severe single vessel disease w/ pRCA occlusion, treated w/ PCI + DES.  EF then was 45-50%.Admitted 9/22 w/ NSTEMI and CHF. Echo showed EF back down, 40-45%, RV normal. LHC showed severe single-vessel disease with CTO of the proximal to mid RCA just after previously placed stent -> distal vessel fills via bridging collaterals and right to right collaterals and large draping left coronary system, but no left-to-right collaterals. After reviewing images with Dr. Sophronia Simas and Dr. Hillery Hunter as well as Dr. 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 Vessel not intervened on. Medical management elected. Admitted 1/24 w/ a/c CHF, COVID 19 infection and acute PE. LE Venous Dopplers were negative for DVT. Echo showed further reduction in EF and biventricular dysfunction.  EF down to 20-25%, RV severely reduced, mod enlarged, RVSP 37 mmHg. Mod-severe LVH also noted. Chest CT no mediastinal adenopathy. LHC deferred given absence of chest pain and only minimal Hs trop elevation, peaked at 109. He was treated w/ Eliquis, diuresed w/ IV Lasix and placed on GDMT. Discharged on 1/10. Referred to [facility] clinic.    Interval hx: Since his visit in [facility] clinic, he reports feeling much better. No further episodes of shortness of breath; no longer having to sit up in a chair to sleep; no lower extremity; no chest pressure. Last week he did notice some lower extremity edema, however, that improved after a few hours.   Activity level/exercise tolerance: NYHA IIB Orthopnea:  Sleeps on 1  pillows Paroxysmal noctural dyspnea:  no Chest pain/pressure:  no Orthostatic lightheadedness:  no Palpitations:  no Lower extremity edema:  no Presyncope/syncope:  no Cough:  no  Past Medical History:  Diagnosis Date   Coronary artery disease    a. 2012: inferior STEMI s/p DES to RCA,  b. 2016 inferior STEMI for late ISR s/p DES to RCA, c. LHC 09/2020 severe single vessel CAD with 100% stenosis of mid RCA with bridging and collaterals (felt to be CTO - treated medically)   HLD (hyperlipidemia)    HTN (hypertension)    Ischemic cardiomyopathy    a. LV gram EF 40% (03/2014); normal EF by echo   Prediabetes     Current Outpatient Medications  Medication Sig Dispense Refill   apixaban (ELIQUIS) 5 MG TABS tablet Take 1 tablet (5 mg total) by mouth 2 (two) times daily. 60 tablet 3   aspirin 81 MG chewable tablet Chew 1 tablet (81 mg total) by mouth daily. 30 tablet 0   atorvastatin (LIPITOR) 80 MG tablet Take 1 tablet (80 mg total) by mouth daily at 6 PM. 30 tablet 6   carvedilol (COREG) 12.5 MG tablet Take 1 tablet (12.5 mg total) by mouth 2 (two) times daily with a meal. 60 tablet 3   empagliflozin (JARDIANCE) 10 MG TABS tablet Take 1 tablet (10 mg total) by mouth daily. 30 tablet 0   furosemide (LASIX) 20 MG tablet Take 1 tablet (20 mg total) by mouth 3 (three) times a week. Every Mon, Wed, and Fri 15 tablet 3   nitroGLYCERIN (NITROSTAT) 0.4 MG SL tablet  Place 1 tablet (0.4 mg total) under the tongue every 5 (five) minutes as needed for chest pain. 25 tablet 1   sacubitril-valsartan (ENTRESTO) 24-26 MG Take 1 tablet by mouth 2 (two) times daily. 60 tablet 3   spironolactone (ALDACTONE) 25 MG tablet Take 1 tablet (25 mg total) by mouth daily. 30 tablet 3   No current facility-administered medications for this encounter.    No Known Allergies    Social History   Socioeconomic History   Marital status: Single    Spouse name: Not on file   Number of children: 2   Years of  education: Not on file   Highest education level: High school graduate  Occupational History   Occupation: Sports administrator and [employer]  Tobacco Use   Smoking status: Never   Smokeless tobacco: Never  Vaping Use   Vaping Use: Never used  Substance and Sexual Activity   Alcohol use: Yes    Comment: socially   Drug use: No   Sexual activity: Not on file  Other Topics Concern   Not on file  Social History Narrative   Not on file   Social Determinants of Health   Financial Resource Strain: Medium Risk (01/08/2022)   Overall Financial Resource Strain (CARDIA)    Difficulty of Paying Living Expenses: Somewhat hard  Food Insecurity: No Food Insecurity (01/06/2022)   Hunger Vital Sign    Worried About Running Out of Food in the Last Year: Never true    Ran Out of Food in the Last Year: Never true  Transportation Needs: No Transportation Needs (01/06/2022)   PRAPARE - Hydrologist (Medical): No    Lack of Transportation (Non-Medical): No  Physical Activity: Not on file  Stress: Not on file  Social Connections: Not on file  Intimate Partner Violence: Not At Risk (01/06/2022)   Humiliation, Afraid, Rape, and Kick questionnaire    Fear of Current or Ex-Partner: No    Emotionally Abused: No    Physically Abused: No    Sexually Abused: No      Family History  Problem Relation Age of Onset   Heart disease Mother    Diabetes Mother    Diabetes Father    Heart disease Father     PHYSICAL EXAM: Vitals:   01/31/22 1007  BP: 130/88  Pulse: 72  SpO2: 99%   GENERAL: Well nourished, well developed, and in no apparent distress at rest.  HEENT: Negative for arcus senilis or xanthelasma. There is no scleral icterus.  The mucous membranes are pink and moist.   NECK: Supple, No masses. Normal carotid upstrokes without bruits. No masses or thyromegaly.    CHEST: There are no chest wall deformities. There is no chest wall tenderness. Respirations are unlabored.   Lungs- CTA B/L CARDIAC:  JVP: 8 cm H2O         Normal S1, S2  Normal rate with regular rhythm. No murmurs, rubs or gallops.  Pulses are 2+ and symmetrical in upper and lower extremities. no edema.  ABDOMEN: Soft, non-tender, non-distended. There are no masses or hepatomegaly. There are normal bowel sounds.  EXTREMITIES: Warm and well perfused with no cyanosis, clubbing.  LYMPHATIC: No axillary or supraclavicular lymphadenopathy.  NEUROLOGIC: Patient is oriented x3 with no focal or lateralizing neurologic deficits.  PSYCH: Patients affect is appropriate, there is no evidence of anxiety or depression.  SKIN: Warm and dry; no lesions or wounds.   DATA REVIEW  ECG:NSR w/  inferolateral ST-T inversions (01/19/22)  ECHO:LVEF 20-25%, moderate to severe LVH, moderately enlarged RV.   CATH: 09/20/20:  Severe single-vessel disease with CTO of the proximal to mid RCA just after previously placed stent -> distal vessel fills via bridging collaterals and right to right collaterals. After reviewing images with Dr. Sophronia Simas and Dr. Hillery Hunter as well as Dr. 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 ->   Large draping left coronary system, but no left-to-right collaterals. Mild-moderately elevated LVEDP of 18 mmHg.   ASSESSMENT & PLAN:  Heart failure with reduced ejection fraction Etiology of HF: I believe his heart failure is multifactorial in etiology.  He had a large posterior STEMI in 2012 however that does not explain the degree of severe global hypokinesis with concurrent severe LVH.  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  Reports long history of hypertension.  Of note he did have reduction in biventricular function after COVID-19 diagnosis and acute PE so this could be exacerbated by stress cardiomyopathy.  Due to severe LVH will obtain cardiac MRI to rule out infiltrative cardiomyopathies.  Will hold off on genetic testing for the time  being. NYHA class / AHA Stage: NYHA II Volume status & Diuretics: Taking lasix 20mg  every other day; I've asked him to take additional doses as needed for edema or weight gain.  Vasodilators: Increase Entresto to 49/51 mg twice daily Beta-Blocker: Coreg 12.5 mg twice daily.  Plan to increase at follow-up. MRA: Spironolactone 25 mg daily Cardiometabolic: Jardiance 10 mg daily Devices therapies & Valvulopathies: Will plan for cardiac MRI and/or repeat echo to evaluate for ICD. Advanced therapies: Not currently indicated.  2 hypertension - Entresto 49/51 and Coreg 12.5 mg twice daily.  3. CAD -CTO of the RCA at previously placed stent.  Distal vessel filled by bridging collaterals. - Lipitor 80 mg, aspirin 81 mg.  Ramell Wacha Advanced Heart Failure Mechanical Circulatory Support

## 2022-01-31 NOTE — Patient Instructions (Signed)
Medication Changes:  CHANGE. Increase Entresto 49/51mg . Take one tablet, two times a day.   Lab Work:  Labs done today, your results will be available in MyChart, we will contact you for abnormal readings.  Testing/Procedures:  Your physician has requested that you have a cardiac MRI. Cardiac MRI uses a computer to create images of your heart as its beating, producing both still and moving pictures of your heart and major blood vessels. For further information please visit [email or web address] Please follow the instruction sheet given to you today for more information. The radiology department will call to schedule your appointment once approved by insurance.   Special Instructions // Education:  Do the following things EVERYDAY: Weigh yourself in the morning before breakfast. Write it down and keep it in a log. Take your medicines as prescribed Eat low salt foods--Limit salt (sodium) to 2000 mg per day.  Stay as active as you can everyday Limit all fluids for the day to less than 2 liters  Follow-Up in: 1 month with pharmacy.  2 months with Dr. Daniel Nones.      At the [facility], you and your health needs are our priority. We have a designated team specialized in the treatment of Heart Failure. This Care Team includes your primary Heart Failure Specialized Cardiologist (physician), Advanced Practice Providers (APPs- Physician Assistants and Nurse Practitioners), and Pharmacist who all work together to provide you with the care you need, when you need it.   You may see any of the following providers on your designated Care Team at your next follow up:  Dr. Glori Bickers Dr. Loralie Champagne Dr. Roxana Hires, NP Lyda Jester, [place] [facility] [address] [place], [place] Forestine Na, NP Audry Riles, PharmD   Please be sure to bring in all your medications bottles to every appointment.   Need to Contact us:  If you have any questions or concerns  before your next appointment please send [place] a message through [place] or call our office at [phone].    TO LEAVE A MESSAGE FOR THE NURSE SELECT OPTION 2, PLEASE LEAVE A MESSAGE INCLUDING: YOUR NAME DATE OF BIRTH CALL BACK NUMBER REASON FOR CALL**this is important as we prioritize the call backs  YOU WILL RECEIVE A CALL BACK THE SAME DAY AS LONG AS YOU CALL BEFORE 4:00 PM

## 2022-02-05 ENCOUNTER — Encounter: Payer: Self-pay | Admitting: Family Medicine

## 2022-02-05 ENCOUNTER — Ambulatory Visit (INDEPENDENT_AMBULATORY_CARE_PROVIDER_SITE_OTHER): Payer: No Typology Code available for payment source | Admitting: Family Medicine

## 2022-02-05 VITALS — BP 129/75 | HR 60 | Temp 98.1°F | Resp 16 | Ht 70.0 in | Wt 168.4 lb

## 2022-02-05 DIAGNOSIS — I10 Essential (primary) hypertension: Secondary | ICD-10-CM

## 2022-02-05 DIAGNOSIS — I2699 Other pulmonary embolism without acute cor pulmonale: Secondary | ICD-10-CM

## 2022-02-05 DIAGNOSIS — R7303 Prediabetes: Secondary | ICD-10-CM

## 2022-02-05 DIAGNOSIS — Z23 Encounter for immunization: Secondary | ICD-10-CM

## 2022-02-05 DIAGNOSIS — E7800 Pure hypercholesterolemia, unspecified: Secondary | ICD-10-CM

## 2022-02-05 DIAGNOSIS — N179 Acute kidney failure, unspecified: Secondary | ICD-10-CM

## 2022-02-05 DIAGNOSIS — Z7689 Persons encountering health services in other specified circumstances: Secondary | ICD-10-CM

## 2022-02-05 DIAGNOSIS — I5042 Chronic combined systolic (congestive) and diastolic (congestive) heart failure: Secondary | ICD-10-CM

## 2022-02-05 NOTE — Progress Notes (Signed)
Patient is here to established care with provider today. Patient has many health concern they would like to discuss with provider today  Care gaps discuss at appointment today  

## 2022-02-05 NOTE — Progress Notes (Signed)
New Patient Office Visit  Subjective    Patient ID: Russell Baldwin, male    DOB: 1970-11-22  Age: 52 y.o. MRN: [id number]  CC: No chief complaint on file.   HPI Russell Baldwin presents to establish care and for review of chronic med issues. Patient was discharged last month from the hospital and has follow up scheduled with cardiology   Outpatient Encounter Medications as of 02/05/2022  Medication Sig   apixaban (ELIQUIS) 5 MG TABS tablet Take 1 tablet (5 mg total) by mouth 2 (two) times daily.   aspirin 81 MG chewable tablet Chew 1 tablet (81 mg total) by mouth daily.   atorvastatin (LIPITOR) 80 MG tablet Take 1 tablet (80 mg total) by mouth daily at 6 PM.   carvedilol (COREG) 12.5 MG tablet Take 1 tablet (12.5 mg total) by mouth 2 (two) times daily with a meal.   empagliflozin (JARDIANCE) 10 MG TABS tablet Take 1 tablet (10 mg total) by mouth daily.   furosemide (LASIX) 20 MG tablet Take 1 tablet (20 mg total) by mouth 3 (three) times a week. Every Mon, Wed, and Fri   nitroGLYCERIN (NITROSTAT) 0.4 MG SL tablet Place 1 tablet (0.4 mg total) under the tongue every 5 (five) minutes as needed for chest pain.   sacubitril-valsartan (ENTRESTO) 49-51 MG Take 1 tablet by mouth 2 (two) times daily.   spironolactone (ALDACTONE) 25 MG tablet Take 1 tablet (25 mg total) by mouth daily.   No facility-administered encounter medications on file as of 02/05/2022.    Past Medical History:  Diagnosis Date   Coronary artery disease    a. 2012: inferior STEMI s/p DES to RCA,  b. 2016 inferior STEMI for late ISR s/p DES to RCA, c. LHC 09/2020 severe single vessel CAD with 100% stenosis of mid RCA with bridging and collaterals (felt to be CTO - treated medically)   HLD (hyperlipidemia)    HTN (hypertension)    Ischemic cardiomyopathy    a. LV gram EF 40% (03/2014); normal EF by echo   Prediabetes     Past Surgical History:  Procedure Laterality Date   CARDIAC CATHETERIZATION     LEFT HEART CATH N/A  03/21/2014   Procedure: LEFT HEART CATH;  Surgeon: Lorretta Harp, MD;  Location: [facility] CATH LAB;  Service: Cardiovascular;  Laterality: N/A;   LEFT HEART CATH AND CORONARY ANGIOGRAPHY N/A 09/20/2020   Procedure: LEFT HEART CATH AND CORONARY ANGIOGRAPHY;  Surgeon: Leonie Man, MD;  Location: [place] CV LAB;  Service: Cardiovascular;  Laterality: N/A;    Family History  Problem Relation Age of Onset   Heart disease Mother    Diabetes Mother    Diabetes Father    Heart disease Father     Social History   Socioeconomic History   Marital status: Single    Spouse name: Not on file   Number of children: 2   Years of education: Not on file   Highest education level: High school graduate  Occupational History   Occupation: Sports administrator and [employer]  Tobacco Use   Smoking status: Never   Smokeless tobacco: Never  Vaping Use   Vaping Use: Never used  Substance and Sexual Activity   Alcohol use: Yes    Comment: socially   Drug use: No   Sexual activity: Not on file  Other Topics Concern   Not on file  Social History Narrative   Not on file   Social Determinants of Health  Financial Resource Strain: Medium Risk (01/08/2022)   Overall Financial Resource Strain (CARDIA)    Difficulty of Paying Living Expenses: Somewhat hard  Food Insecurity: No Food Insecurity (01/06/2022)   Hunger Vital Sign    Worried About Running Out of Food in the Last Year: Never true    Ran Out of Food in the Last Year: Never true  Transportation Needs: No Transportation Needs (01/06/2022)   PRAPARE - Hydrologist (Medical): No    Lack of Transportation (Non-Medical): No  Physical Activity: Not on file  Stress: Not on file  Social Connections: Not on file  Intimate Partner Violence: Not At Risk (01/06/2022)   Humiliation, Afraid, Rape, and Kick questionnaire    Fear of Current or Ex-Partner: No    Emotionally Abused: No    Physically Abused: No    Sexually Abused: No     Review of Systems  All other systems reviewed and are negative.       Objective    BP 129/75   Pulse 60   Temp 98.1 F (36.7 C) (Oral)   Resp 16   Ht 5\' 10"  (1.778 m)   Wt 168 lb 6.4 oz (76.4 kg)   SpO2 98%   BMI 24.16 kg/m   Physical Exam Vitals and nursing note reviewed.  Constitutional:      General: He is not in acute distress. HENT:     Head: Normocephalic and atraumatic.  Cardiovascular:     Rate and Rhythm: Normal rate and regular rhythm.  Pulmonary:     Effort: Pulmonary effort is normal.     Breath sounds: Normal breath sounds.  Abdominal:     Palpations: Abdomen is soft.     Tenderness: There is no abdominal tenderness.  Musculoskeletal:     Right lower leg: No edema.     Left lower leg: No edema.  Neurological:     General: No focal deficit present.     Mental Status: He is alert and oriented to person, place, and time.         Assessment & Plan:   1. Essential hypertension Appear stable. continue - Basic Metabolic Panel  2. Chronic combined systolic and diastolic congestive heart failure ([place]) Management as per cardiology  3. Pure hypercholesterolemia Continue   4. AKI (acute kidney injury) ([place]) Will recheck bmp  5. Prediabetes Recent A1c at goal. On Jardiance. Discussed dietary and activity options.   6. PE (pulmonary thromboembolism) ([place]) Continue eliquis for 6 months as recommended  7. Need for shingles vaccine  - Varicella-zoster vaccine IM  8. Encounter to establish care     Return in about 3 months (around 05/06/2022) for follow up.   Russell Sax, MD

## 2022-02-06 LAB — BASIC METABOLIC PANEL
BUN/Creatinine Ratio: 6 — ABNORMAL LOW (ref 9–20)
BUN: 6 mg/dL (ref 6–24)
CO2: 18 mmol/L — ABNORMAL LOW (ref 20–29)
Calcium: 8.9 mg/dL (ref 8.7–10.2)
Chloride: 108 mmol/L — ABNORMAL HIGH (ref 96–106)
Creatinine, Ser: 0.94 mg/dL (ref 0.76–1.27)
Glucose: 104 mg/dL — ABNORMAL HIGH (ref 70–99)
Potassium: 4.5 mmol/L (ref 3.5–5.2)
Sodium: 141 mmol/L (ref 134–144)
eGFR: 99 mL/min/{1.73_m2} (ref >59)

## 2022-02-07 ENCOUNTER — Other Ambulatory Visit (HOSPITAL_COMMUNITY): Payer: Self-pay

## 2022-02-07 NOTE — Telephone Encounter (Signed)
Advanced Heart Failure Patient Advocate Encounter  Document scanned to chart.   Charlann Boxer, CPhT

## 2022-02-07 NOTE — Telephone Encounter (Signed)
Advanced Heart Failure Patient Advocate Encounter  Patient was approved to receive Jardiance from [place] Effective 02/07/22 to 01/01/23  Medication is being processed and should be delivered "within 5 business days" Left voicemail updating patient.  Clista Bernhardt, CPhT Rx Patient Advocate Phone: [phone]

## 2022-02-08 ENCOUNTER — Telehealth: Payer: Self-pay | Admitting: Cardiovascular Disease

## 2022-02-08 NOTE — Telephone Encounter (Signed)
Russell Hector, MD  P Cv Div Nl Scheduling Needs TOC post hospital with [facility] CHF/PE  needs  f/u with EP to consider AICD  LVM 3x to schedule appt with no success, will send MyChart message

## 2022-02-12 ENCOUNTER — Encounter: Payer: Self-pay | Admitting: Internal Medicine

## 2022-02-12 ENCOUNTER — Ambulatory Visit: Payer: No Typology Code available for payment source | Attending: Internal Medicine | Admitting: Internal Medicine

## 2022-02-12 VITALS — BP 136/84 | HR 52 | Ht 70.0 in | Wt 168.0 lb

## 2022-02-12 DIAGNOSIS — I509 Heart failure, unspecified: Secondary | ICD-10-CM | POA: Diagnosis not present

## 2022-02-12 DIAGNOSIS — D649 Anemia, unspecified: Secondary | ICD-10-CM

## 2022-02-12 DIAGNOSIS — I255 Ischemic cardiomyopathy: Secondary | ICD-10-CM | POA: Diagnosis not present

## 2022-02-12 NOTE — Patient Instructions (Addendum)
Medication Instructions:  Your physician recommends that you continue on your current medications as directed. Please refer to the Current Medication list given to you today.  *If you need a refill on your cardiac medications before your next appointment, please call your pharmacy*   Lab Work: CBC, Fe, TIBC and Ferritin Panel If you have labs (blood work) drawn today and your tests are completely normal, you will receive your results only by: [place] (if you have MyChart) OR A paper copy in the mail If you have any lab test that is abnormal or we need to change your treatment, we will call you to review the results.   Testing/Procedures: None ordered.    Follow-Up: At [facility], you and your health needs are our priority.  As part of our continuing mission to provide you with exceptional heart care, we have created designated Provider Care Teams.  These Care Teams include your primary Cardiologist (physician) and Advanced Practice Providers (APPs -  Physician Assistants and Nurse Practitioners) who all work together to provide you with the care you need, when you need it.  We recommend signing up for the patient portal called "MyChart".  Sign up information is provided on this After Visit Summary.  MyChart is used to connect with patients for Virtual Visits (Telemedicine).  Patients are able to view lab/test results, encounter notes, upcoming appointments, etc.  Non-urgent messages can be sent to your provider as well.   To learn more about what you can do with MyChart, go to [email or web address].    Your next appointment:   Follow up with Dr Caryl Comes as needed

## 2022-02-12 NOTE — Progress Notes (Signed)
ELECTROPHYSIOLOGY CONSULT NOTE  Patient ID: Tad Edmond, MRN: [id number], DOB/AGE: 51-21-1972 51 y.o. Admit date: (Not on file) Date of Consult: 02/12/2022  Primary Physician: Dorna Mai, MD Primary Cardiologist: AS     Kelcie Loo is a 52 y.o. male who is being seen today for the evaluation of cardiomyopathy at the request of AS.    HPI Elham Wyles is a 52 y.o. male referred for cardiomyopathy having presented 1/24 with acute heart failure.  Concomitant COVID and pulmonary embolism.  He had had problems with swelling in the weeks before orthopnea having to sleep in a chair fatigue and dyspnea on exertion.  History of coronary artery disease with an inferior wall MI at the age of 50.  Non-STEMI 922.  At this juncture feeling much better.  No nocturnal dyspnea no orthopnea no edema.  Minimal dyspnea.  Has had 2 episodes where of abrupt onset of lightheadedness and diaphoresis 1 lasted 3-5 minutes 1 lasting 1 or 2 minutes unassociated with palpitations. DATE TEST EF   3/16 Echo  50-55%   9/22 Echo   40-45 %   9/22 LHC    % R-T CTO  1/24 Echo  25% LVH 15 mm   cMRI   pending   Date Cr K Hgb/ MCV  9/22    13.4 (83)  2/24 0.94 4.5 10.7 (67)              Past Medical History:  Diagnosis Date   Coronary artery disease    a. 2012: inferior STEMI s/p DES to RCA,  b. 2016 inferior STEMI for late ISR s/p DES to RCA, c. LHC 09/2020 severe single vessel CAD with 100% stenosis of mid RCA with bridging and collaterals (felt to be CTO - treated medically)   HLD (hyperlipidemia)    HTN (hypertension)    Ischemic cardiomyopathy    a. LV gram EF 40% (03/2014); normal EF by echo   Prediabetes       Surgical History:  Past Surgical History:  Procedure Laterality Date   CARDIAC CATHETERIZATION     LEFT HEART CATH N/A 03/21/2014   Procedure: LEFT HEART CATH;  Surgeon: Lorretta Harp, MD;  Location: [facility] CATH LAB;  Service: Cardiovascular;  Laterality: N/A;   LEFT HEART  CATH AND CORONARY ANGIOGRAPHY N/A 09/20/2020   Procedure: LEFT HEART CATH AND CORONARY ANGIOGRAPHY;  Surgeon: Leonie Man, MD;  Location: [place] CV LAB;  Service: Cardiovascular;  Laterality: N/A;     Home Meds: Current Meds  Medication Sig   apixaban (ELIQUIS) 5 MG TABS tablet Take 1 tablet (5 mg total) by mouth 2 (two) times daily.   atorvastatin (LIPITOR) 80 MG tablet Take 1 tablet (80 mg total) by mouth daily at 6 PM.   carvedilol (COREG) 12.5 MG tablet Take 1 tablet (12.5 mg total) by mouth 2 (two) times daily with a meal.   furosemide (LASIX) 20 MG tablet Take 1 tablet (20 mg total) by mouth 3 (three) times a week. Every Mon, Wed, and Fri   nitroGLYCERIN (NITROSTAT) 0.4 MG SL tablet Place 1 tablet (0.4 mg total) under the tongue every 5 (five) minutes as needed for chest pain.   sacubitril-valsartan (ENTRESTO) 49-51 MG Take 1 tablet by mouth 2 (two) times daily.   spironolactone (ALDACTONE) 25 MG tablet Take 1 tablet (25 mg total) by mouth daily.    Allergies: No Known Allergies  Social History   Socioeconomic History   Marital status:  Single    Spouse name: Not on file   Number of children: 2   Years of education: Not on file   Highest education level: High school graduate  Occupational History   Occupation: Sports administrator and [employer]  Tobacco Use   Smoking status: Never   Smokeless tobacco: Never  Vaping Use   Vaping Use: Never used  Substance and Sexual Activity   Alcohol use: Yes    Comment: socially   Drug use: No   Sexual activity: Not on file  Other Topics Concern   Not on file  Social History Narrative   Not on file   Social Determinants of Health   Financial Resource Strain: Medium Risk (01/08/2022)   Overall Financial Resource Strain (CARDIA)    Difficulty of Paying Living Expenses: Somewhat hard  Food Insecurity: No Food Insecurity (01/06/2022)   Hunger Vital Sign    Worried About Running Out of Food in the Last Year: Never true    Ran Out  of Food in the Last Year: Never true  Transportation Needs: No Transportation Needs (01/06/2022)   PRAPARE - Hydrologist (Medical): No    Lack of Transportation (Non-Medical): No  Physical Activity: Not on file  Stress: Not on file  Social Connections: Not on file  Intimate Partner Violence: Not At Risk (01/06/2022)   Humiliation, Afraid, Rape, and Kick questionnaire    Fear of Current or Ex-Partner: No    Emotionally Abused: No    Physically Abused: No    Sexually Abused: No     Family History  Problem Relation Age of Onset   Heart disease Mother    Diabetes Mother    Diabetes Father    Heart disease Father      ROS:  Please see the history of present illness.     All other systems reviewed and negative.    Physical Exam: Blood pressure 136/84, pulse (!) 52, height 5' 10"$  (1.778 m), weight 168 lb (76.2 kg), SpO2 98 %. General: Well developed, well nourished male in no acute distress. Head: Normocephalic, atraumatic, sclera non-icteric, no xanthomas, nares are without discharge. EENT: normal  Lymph Nodes:  none Neck: Negative for carotid bruits. JVD not elevated. Back:without scoliosis kyphosis Lungs: Clear bilaterally to auscultation without wheezes, rales, or rhonchi. Breathing is unlabored. Heart: RRR with S1 S2. No  murmur . No rubs, or gallops appreciated. Abdomen: Soft, non-tender, non-distended with normoactive bowel sounds. No hepatomegaly. No rebound/guarding. No obvious abdominal masses. Msk:  Strength and tone appear normal for age. Extremities: No clubbing or cyanosis. No  edema.  Distal pedal pulses are 2+ and equal bilaterally. Skin: Warm and Dry Neuro: Alert and oriented X 3. CN III-XII intact Grossly normal sensory and motor function . Psych:  Responds to questions appropriately with a normal affect.        EKG: Sinus at 52 Interval 16/11/45 Inferior wall infarct ST-T changes V5 and V6 LVH-borderline   Assessment and Plan:   Ischemic and nonischemic cardiomyopathy  Coronary artery disease with prior MI  Left ventricular hypertrophy  Congestive heart failure-chronic-systolic class IIa  COVID  Pulmonary embolism  Anemia-microcytic  Abnormal electrocardiogram  Hypertension  The patient has a cardiomyopathy with progressive left ventricular dysfunction.  Guideline directed medical therapy has been initiated about a month ago.  His symptoms are vastly improved and my hope would be that with ongoing medical therapy he will have interval improvement such that his ejection fraction exceeds  the ICD threshold.  To this end, I have reached out to Dr. Natalia Leatherwood, we will start him on SGLT2.  His MRI is scheduled for 4/24 and this will give [place] important information given left ventricular hypertrophy.  If he has hypertrophic cardiomyopathy then his depressed LV function i.e. less than 50%, would justify ICD implantation for primary prevention.  It would be important also to exclude infiltrative cardiomyopathies.  He has a new microcytic anemia.  This predated his hospitalization for COVID and he was already microcytic at the time.  Iron labs have been ordered today.  I have reached out to his primary care physician.  Certainly would anticipate an abdominal CT prior to proceeding with an ICD so as to exclude malignancy even if guaiac testing is negative.  Lengthy discussion regarding the anxieties of having a cardiomyopathy.  He is currently not working.  His functional status is acceptable.  Hopefully he can be cleared to return to work withstanding his cardiomyopathy    Virl Axe

## 2022-02-15 ENCOUNTER — Ambulatory Visit: Payer: No Typology Code available for payment source | Attending: Internal Medicine

## 2022-02-15 LAB — CBC

## 2022-02-16 LAB — IRON,TIBC AND FERRITIN PANEL
Ferritin: 49 ng/mL (ref 30–400)
Iron Saturation: 17 % (ref 15–55)
Iron: 53 ug/dL (ref 38–169)
Total Iron Binding Capacity: 319 ug/dL (ref 250–450)
UIBC: 266 ug/dL (ref 111–343)

## 2022-02-16 LAB — CBC
Hematocrit: 39.1 % (ref 37.5–51.0)
Hemoglobin: 11.4 g/dL — ABNORMAL LOW (ref 13.0–17.7)
MCH: 21 pg — ABNORMAL LOW (ref 26.6–33.0)
MCHC: 29.2 g/dL — ABNORMAL LOW (ref 31.5–35.7)
MCV: 72 fL — ABNORMAL LOW (ref 79–97)
Platelets: 209 10*3/uL (ref 150–450)
RBC: 5.43 x10E6/uL (ref 4.14–5.80)
RDW: 21.8 % — ABNORMAL HIGH (ref 11.6–15.4)
WBC: 3.3 10*3/uL — ABNORMAL LOW (ref 3.4–10.8)

## 2022-03-01 ENCOUNTER — Inpatient Hospital Stay (HOSPITAL_COMMUNITY)
Admission: RE | Admit: 2022-03-01 | Discharge: 2022-03-01 | Disposition: A | Discharge status: Home / Self Care | Payer: No Typology Code available for payment source | Source: Ambulatory Visit

## 2022-03-01 ENCOUNTER — Telehealth (HOSPITAL_COMMUNITY): Payer: Self-pay | Admitting: Pharmacist

## 2022-03-01 NOTE — Telephone Encounter (Signed)
Patient returned call. Reports he never received Jardiance via mail from patient assistance. He was given the phone number to call Jardiance PAP and order his shipment.

## 2022-03-01 NOTE — Telephone Encounter (Signed)
Patient rescheduled pharmacy appointment to the day before his next CHF MD visit. Given limited utility of a visit this close to MD visit, will cancel pharmacy appointment. Left voicemail.

## 2022-03-15 ENCOUNTER — Other Ambulatory Visit (HOSPITAL_COMMUNITY): Payer: No Typology Code available for payment source

## 2022-03-16 ENCOUNTER — Encounter (HOSPITAL_COMMUNITY): Payer: Self-pay | Admitting: Cardiology

## 2022-03-16 ENCOUNTER — Ambulatory Visit (HOSPITAL_COMMUNITY)
Admission: RE | Admit: 2022-03-16 | Discharge: 2022-03-16 | Disposition: A | Discharge status: Home / Self Care | Payer: No Typology Code available for payment source | Source: Ambulatory Visit | Attending: Cardiology | Admitting: Cardiology

## 2022-03-16 VITALS — BP 120/80 | HR 58 | Wt 176.4 lb

## 2022-03-16 DIAGNOSIS — I110 Hypertensive heart disease with heart failure: Secondary | ICD-10-CM | POA: Diagnosis not present

## 2022-03-16 DIAGNOSIS — Z79899 Other long term (current) drug therapy: Secondary | ICD-10-CM | POA: Insufficient documentation

## 2022-03-16 DIAGNOSIS — Z7984 Long term (current) use of oral hypoglycemic drugs: Secondary | ICD-10-CM | POA: Insufficient documentation

## 2022-03-16 DIAGNOSIS — I252 Old myocardial infarction: Secondary | ICD-10-CM | POA: Diagnosis not present

## 2022-03-16 DIAGNOSIS — I5022 Chronic systolic (congestive) heart failure: Secondary | ICD-10-CM | POA: Insufficient documentation

## 2022-03-16 DIAGNOSIS — I10 Essential (primary) hypertension: Secondary | ICD-10-CM | POA: Diagnosis not present

## 2022-03-16 DIAGNOSIS — Z86711 Personal history of pulmonary embolism: Secondary | ICD-10-CM | POA: Insufficient documentation

## 2022-03-16 DIAGNOSIS — Z8616 Personal history of COVID-19: Secondary | ICD-10-CM | POA: Diagnosis not present

## 2022-03-16 DIAGNOSIS — I5042 Chronic combined systolic (congestive) and diastolic (congestive) heart failure: Secondary | ICD-10-CM | POA: Diagnosis present

## 2022-03-16 DIAGNOSIS — I2510 Atherosclerotic heart disease of native coronary artery without angina pectoris: Secondary | ICD-10-CM

## 2022-03-16 LAB — BASIC METABOLIC PANEL
Anion gap: 8 (ref 5–15)
BUN: <5 mg/dL — ABNORMAL LOW (ref 6–20)
CO2: 24 mmol/L (ref 22–32)
Calcium: 9 mg/dL (ref 8.9–10.3)
Chloride: 106 mmol/L (ref 98–111)
Creatinine, Ser: 0.86 mg/dL (ref 0.61–1.24)
GFR, Estimated: >60 mL/min (ref >60)
Glucose, Bld: 99 mg/dL (ref 70–99)
Potassium: 3.9 mmol/L (ref 3.5–5.1)
Sodium: 138 mmol/L (ref 135–145)

## 2022-03-16 LAB — BRAIN NATRIURETIC PEPTIDE: B Natriuretic Peptide: 217.4 pg/mL — ABNORMAL HIGH (ref 0.0–100.0)

## 2022-03-16 MED ORDER — ENTRESTO 97-103 MG PO TABS: 1.0000 | ORAL_TABLET | Freq: Two times a day (BID) | ORAL | 11 refills | Status: DC

## 2022-03-16 NOTE — Progress Notes (Signed)
ADVANCED HEART FAILURE CLINIC NOTE  Referring Physician: No ref. provider found  Primary Care: Dorna Mai, MD Primary Cardiologist:  HPI: Russell Baldwin is a 52 y.o. male who presents for initial visit for further evaluation and treatment of heart failure/cardiomyopathy. 52 y/o AAM w/ h/o CAD s/p acute inferior wall STEMI in 2012, at the age of 80, c/b transient heart block necessitating TVP. Emergent cath showed severe single vessel disease w/ pRCA occlusion, treated w/ PCI + DES.  EF then was 45-50%.Admitted 9/22 w/ NSTEMI and CHF. Echo showed EF back down, 40-45%, RV normal. LHC showed severe single-vessel disease with CTO of the proximal to mid RCA just after previously placed stent -> distal vessel fills via bridging collaterals and right to right collaterals and large draping left coronary system, but no left-to-right collaterals. After reviewing images with Dr. Fletcher Anon and Dr. Hillery Hunter as well as Dr. 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 Vessel not intervened on. Medical management elected. Admitted 1/24 w/ a/c CHF, COVID 19 infection and acute PE. LE Venous Dopplers were negative for DVT. Echo showed further reduction in EF and biventricular dysfunction.  EF down to 20-25%, RV severely reduced, mod enlarged, RVSP 37 mmHg. Mod-severe LVH also noted. Chest CT no mediastinal adenopathy. LHC deferred given absence of chest pain and only minimal Hs trop elevation, peaked at 109. He was treated w/ Eliquis, diuresed w/ IV Lasix and placed on GDMT. Discharged on 1/10. Referred to [facility] clinic.    Interval hx: Doing very well since his last visit; no complaints today; no dyspnea on exertion, LE edema, chest pressure or lightheadedness. Complaint with all medications.   Activity level/exercise tolerance: NYHA IIB Orthopnea:  Sleeps on 1 pillows Paroxysmal noctural dyspnea:  no Chest pain/pressure:  no Orthostatic lightheadedness:  no Palpitations:  no Lower  extremity edema:  no Presyncope/syncope:  no Cough:  no  Past Medical History:  Diagnosis Date   Coronary artery disease    a. 2012: inferior STEMI s/p DES to RCA,  b. 2016 inferior STEMI for late ISR s/p DES to RCA, c. LHC 09/2020 severe single vessel CAD with 100% stenosis of mid RCA with bridging and collaterals (felt to be CTO - treated medically)   HLD (hyperlipidemia)    HTN (hypertension)    Ischemic cardiomyopathy    a. LV gram EF 40% (03/2014); normal EF by echo   Prediabetes     Current Outpatient Medications  Medication Sig Dispense Refill   apixaban (ELIQUIS) 5 MG TABS tablet Take 1 tablet (5 mg total) by mouth 2 (two) times daily. 60 tablet 3   atorvastatin (LIPITOR) 80 MG tablet Take 1 tablet (80 mg total) by mouth daily at 6 PM. 30 tablet 6   carvedilol (COREG) 12.5 MG tablet Take 1 tablet (12.5 mg total) by mouth 2 (two) times daily with a meal. 60 tablet 3   furosemide (LASIX) 20 MG tablet Take 1 tablet (20 mg total) by mouth 3 (three) times a week. Every Mon, Wed, and Fri 15 tablet 3   nitroGLYCERIN (NITROSTAT) 0.4 MG SL tablet Place 1 tablet (0.4 mg total) under the tongue every 5 (five) minutes as needed for chest pain. 25 tablet 1   sacubitril-valsartan (ENTRESTO) 49-51 MG Take 1 tablet by mouth 2 (two) times daily. 60 tablet 3   spironolactone (ALDACTONE) 25 MG tablet Take 1 tablet (25 mg total) by mouth daily. 30 tablet 3   No current facility-administered medications for  this visit.    No Known Allergies    Social History   Socioeconomic History   Marital status: Single    Spouse name: Not on file   Number of children: 2   Years of education: Not on file   Highest education level: High school graduate  Occupational History   Occupation: Sports administrator and [employer]  Tobacco Use   Smoking status: Never   Smokeless tobacco: Never  Vaping Use   Vaping Use: Never used  Substance and Sexual Activity   Alcohol use: Yes    Comment: socially   Drug use:  No   Sexual activity: Not on file  Other Topics Concern   Not on file  Social History Narrative   Not on file   Social Determinants of Health   Financial Resource Strain: Medium Risk (01/08/2022)   Overall Financial Resource Strain (CARDIA)    Difficulty of Paying Living Expenses: Somewhat hard  Food Insecurity: No Food Insecurity (01/06/2022)   Hunger Vital Sign    Worried About Running Out of Food in the Last Year: Never true    Ran Out of Food in the Last Year: Never true  Transportation Needs: No Transportation Needs (01/06/2022)   PRAPARE - Hydrologist (Medical): No    Lack of Transportation (Non-Medical): No  Physical Activity: Not on file  Stress: Not on file  Social Connections: Not on file  Intimate Partner Violence: Not At Risk (01/06/2022)   Humiliation, Afraid, Rape, and Kick questionnaire    Fear of Current or Ex-Partner: No    Emotionally Abused: No    Physically Abused: No    Sexually Abused: No      Family History  Problem Relation Age of Onset   Heart disease Mother    Diabetes Mother    Diabetes Father    Heart disease Father     PHYSICAL EXAM: Vitals:   03/16/22 0930  BP: 120/80  Pulse: (!) 58  SpO2: 99%   GENERAL: Well nourished, well developed, and in no apparent distress at rest.  HEENT: Negative for arcus senilis or xanthelasma. There is no scleral icterus.  The mucous membranes are pink and moist.   NECK: Supple, No masses. Normal carotid upstrokes without bruits. No masses or thyromegaly.    CHEST: There are no chest wall deformities. There is no chest wall tenderness. Respirations are unlabored.  Lungs- cta b/l CARDIAC:  JVP: 8 cm          Normal rate with regular rhythm. No murmurs, rubs or gallops.  Pulses are 2+ and symmetrical in upper and lower extremities. no edema.  ABDOMEN: Soft, non-tender, non-distended. There are no masses or hepatomegaly. There are normal bowel sounds.  EXTREMITIES: Warm and well  perfused with no cyanosis, clubbing.  LYMPHATIC: No axillary or supraclavicular lymphadenopathy.  NEUROLOGIC: Patient is oriented x3 with no focal or lateralizing neurologic deficits.  PSYCH: Patients affect is appropriate, there is no evidence of anxiety or depression.  SKIN: Warm and dry; no lesions or wounds.    DATA REVIEW  ECG:NSR w/ inferolateral ST-T inversions (01/19/22)  ECHO:LVEF 20-25%, moderate to severe LVH, moderately enlarged RV.   CATH: 09/20/20:  Severe single-vessel disease with CTO of the proximal to mid RCA just after previously placed stent -> distal vessel fills via bridging collaterals and right to right collaterals. After reviewing images with Dr. Sophronia Simas and Dr. Hillery Hunter as well as Dr. Audie Box, we decided that with bridging collaterals,  this is likely a CTO. ->   Large draping left coronary system, but no left-to-right collaterals. Mild-moderately elevated LVEDP of 18 mmHg.   ASSESSMENT & PLAN:  Heart failure with reduced ejection fraction Etiology of HF: I believe his heart failure is multifactorial in etiology.  He had a large posterior STEMI in 2012 however that does not explain the degree of severe global hypokinesis with concurrent severe LVH.  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  Reports long history of hypertension.  Of note he did have reduction in biventricular function after COVID-19 diagnosis and acute PE so this could be exacerbated by stress cardiomyopathy.  Plan for PYP scan and genetic testing today due to age of onset and severity of heart failure.  NYHA class / AHA Stage: NYHA II Volume status & Diuretics: Taking lasix 20mg  every other day; I've asked him to take additional doses as needed for edema or weight gain.  Vasodilators: Increase Entresto to 97/103mg  BID Beta-Blocker: Continue coreg 12.5mg  BID; limited by heart rate.  MRA: Spironolactone 25 mg daily Cardiometabolic: Jardiance 10 mg daily Devices  therapies & Valvulopathies: CMR pending.  Advanced therapies: Not currently indicated.  2 hypertension - Entresto 97/103mg  BID and Coreg 12.5 mg twice daily. - well controlled  3. CAD -CTO of the RCA at previously placed stent.  Distal vessel filled by bridging collaterals. - Lipitor 80 mg, aspirin 81 mg.  Cristina Ceniceros Advanced Heart Failure Mechanical Circulatory Support

## 2022-03-16 NOTE — Patient Instructions (Signed)
INCREASE Entresto 97/103 mg Twice daily  Labs done today, your results will be available in MyChart, we will contact you for abnormal readings.  Genetic test has been done, this has to be sent to [place] to be processed and can take 1-2 weeks to get results back.  We will let you know the results.  Your physician has requested that you have an echocardiogram. Echocardiography is a painless test that uses sound waves to create images of your heart. It provides your doctor with information about the size and shape of your heart and how well your heart's chambers and valves are working. This procedure takes approximately one hour. There are no restrictions for this procedure. Please do NOT wear cologne, perfume, aftershave, or lotions (deodorant is allowed). Please arrive 15 minutes prior to your appointment time.  You have been ordered a PYP Scan.  This is done in the Radiology Department of [facility].  When you come for this test please plan to be there 2-3 hours. ONCE APPROVED BY YOUR INSURANCE COMPANY YOU WILL BE CALLED TO HAVE THE TEST ARRANGED.  Your physician recommends that you schedule a follow-up appointment in: 4 months with echocardiogram ( July ) ** please call the office in May to arrange your follow up appointment. **  If you have any questions or concerns before your next appointment please send [place] a message through [place] or call our office at [phone].    TO LEAVE A MESSAGE FOR THE NURSE SELECT OPTION 2, PLEASE LEAVE A MESSAGE INCLUDING: YOUR NAME DATE OF BIRTH CALL BACK NUMBER REASON FOR CALL**this is important as we prioritize the call backs  YOU WILL RECEIVE A CALL BACK THE SAME DAY AS LONG AS YOU CALL BEFORE 4:00 PM  At the [facility], you and your health needs are our priority. As part of our continuing mission to provide you with exceptional heart care, we have created designated Provider Care Teams. These Care Teams include your  primary Cardiologist (physician) and Advanced Practice Providers (APPs- Physician Assistants and Nurse Practitioners) who all work together to provide you with the care you need, when you need it.   You may see any of the following providers on your designated Care Team at your next follow up: Dr Glori Bickers Dr Loralie Champagne Dr. Roxana Hires, NP Lyda Jester, [place] [facility], [place] Forestine Na, NP Audry Riles, PharmD   Please be sure to bring in all your medications bottles to every appointment.    Thank you for choosing [facility]

## 2022-03-21 LAB — MULTIPLE MYELOMA PANEL, SERUM
Albumin SerPl Elph-Mcnc: 3.5 g/dL (ref 2.9–4.4)
Albumin/Glob SerPl: 1.3 (ref 0.7–1.7)
Alpha 1: 0.2 g/dL (ref 0.0–0.4)
Alpha2 Glob SerPl Elph-Mcnc: 0.6 g/dL (ref 0.4–1.0)
B-Globulin SerPl Elph-Mcnc: 0.8 g/dL (ref 0.7–1.3)
Gamma Glob SerPl Elph-Mcnc: 1.2 g/dL (ref 0.4–1.8)
Globulin, Total: 2.7 g/dL (ref 2.2–3.9)
IgA: 86 mg/dL — ABNORMAL LOW (ref 90–386)
IgG (Immunoglobin G), Serum: 1191 mg/dL (ref 603–1613)
IgM (Immunoglobulin M), Srm: 88 mg/dL (ref 20–172)
Total Protein ELP: 6.2 g/dL (ref 6.0–8.5)

## 2022-03-22 ENCOUNTER — Telehealth (HOSPITAL_COMMUNITY): Payer: Self-pay | Admitting: Vascular Surgery

## 2022-03-22 NOTE — Telephone Encounter (Signed)
Lvm giving pt detailed appt 3/27 @ 1030 for pyp scan

## 2022-03-26 ENCOUNTER — Telehealth (HOSPITAL_COMMUNITY): Payer: Self-pay | Admitting: Cardiology

## 2022-03-26 NOTE — Telephone Encounter (Signed)
Patient called to request letter -reports he was advised at last OV to stay out of work for a unclear amount of time. Will need letter with explanation. -if patient is cleared for work will need letter for this as well

## 2022-03-27 NOTE — Telephone Encounter (Signed)
Patient aware of work release Pt works in [employer] so letter with restrictions provided F/u scheduled after CMri 05/04/22 @ 2

## 2022-03-28 ENCOUNTER — Encounter (HOSPITAL_COMMUNITY): Payer: No Typology Code available for payment source

## 2022-04-27 ENCOUNTER — Telehealth (HOSPITAL_COMMUNITY): Payer: Self-pay | Admitting: Emergency Medicine

## 2022-04-27 NOTE — Telephone Encounter (Signed)
Attempted to call patient regarding upcoming cardiac CT appointment. °Left message on voicemail with name and callback number °Ruwayda Curet RN Navigator Cardiac Imaging °[facility] °[phone] Office °[phone] Cell ° °

## 2022-04-30 ENCOUNTER — Ambulatory Visit (HOSPITAL_COMMUNITY): Admission: RE | Admit: 2022-04-30 | Payer: No Typology Code available for payment source | Source: Ambulatory Visit

## 2022-05-01 ENCOUNTER — Encounter (HOSPITAL_COMMUNITY): Payer: Self-pay | Admitting: Cardiology

## 2022-05-01 ENCOUNTER — Ambulatory Visit (HOSPITAL_COMMUNITY)
Admission: RE | Admit: 2022-05-01 | Discharge: 2022-05-01 | Disposition: A | Discharge status: Home / Self Care | Payer: No Typology Code available for payment source | Source: Ambulatory Visit | Attending: Cardiology | Admitting: Cardiology

## 2022-05-01 VITALS — BP 120/80 | HR 78 | Wt 186.8 lb

## 2022-05-01 DIAGNOSIS — Z79899 Other long term (current) drug therapy: Secondary | ICD-10-CM | POA: Diagnosis not present

## 2022-05-01 DIAGNOSIS — I110 Hypertensive heart disease with heart failure: Secondary | ICD-10-CM | POA: Diagnosis not present

## 2022-05-01 DIAGNOSIS — E785 Hyperlipidemia, unspecified: Secondary | ICD-10-CM | POA: Insufficient documentation

## 2022-05-01 DIAGNOSIS — Z7901 Long term (current) use of anticoagulants: Secondary | ICD-10-CM | POA: Diagnosis not present

## 2022-05-01 DIAGNOSIS — I10 Essential (primary) hypertension: Secondary | ICD-10-CM

## 2022-05-01 DIAGNOSIS — Z7984 Long term (current) use of oral hypoglycemic drugs: Secondary | ICD-10-CM | POA: Insufficient documentation

## 2022-05-01 DIAGNOSIS — I2510 Atherosclerotic heart disease of native coronary artery without angina pectoris: Secondary | ICD-10-CM | POA: Insufficient documentation

## 2022-05-01 DIAGNOSIS — I252 Old myocardial infarction: Secondary | ICD-10-CM | POA: Insufficient documentation

## 2022-05-01 DIAGNOSIS — Z955 Presence of coronary angioplasty implant and graft: Secondary | ICD-10-CM | POA: Insufficient documentation

## 2022-05-01 DIAGNOSIS — I255 Ischemic cardiomyopathy: Secondary | ICD-10-CM | POA: Diagnosis not present

## 2022-05-01 DIAGNOSIS — I2699 Other pulmonary embolism without acute cor pulmonale: Secondary | ICD-10-CM | POA: Diagnosis not present

## 2022-05-01 DIAGNOSIS — I5042 Chronic combined systolic (congestive) and diastolic (congestive) heart failure: Secondary | ICD-10-CM | POA: Diagnosis not present

## 2022-05-01 DIAGNOSIS — I5020 Unspecified systolic (congestive) heart failure: Secondary | ICD-10-CM | POA: Diagnosis present

## 2022-05-01 DIAGNOSIS — Z8616 Personal history of COVID-19: Secondary | ICD-10-CM | POA: Insufficient documentation

## 2022-05-01 LAB — BASIC METABOLIC PANEL
Anion gap: 11 (ref 5–15)
BUN: 12 mg/dL (ref 6–20)
CO2: 25 mmol/L (ref 22–32)
Calcium: 9.5 mg/dL (ref 8.9–10.3)
Chloride: 103 mmol/L (ref 98–111)
Creatinine, Ser: 1.14 mg/dL (ref 0.61–1.24)
GFR, Estimated: >60 mL/min (ref >60)
Glucose, Bld: 85 mg/dL (ref 70–99)
Potassium: 4.2 mmol/L (ref 3.5–5.1)
Sodium: 139 mmol/L (ref 135–145)

## 2022-05-01 LAB — BRAIN NATRIURETIC PEPTIDE: B Natriuretic Peptide: 178.8 pg/mL — ABNORMAL HIGH (ref 0.0–100.0)

## 2022-05-01 NOTE — Patient Instructions (Signed)
Lab Work:  Labs done today, your results will be available in MyChart, we will contact you for abnormal readings.  Your physician has requested that you have an echocardiogram. Echocardiography is a painless test that uses sound waves to create images of your heart. It provides your doctor with information about the size and shape of your heart and how well your heart's chambers and valves are working. This procedure takes approximately one hour. There are no restrictions for this procedure. Please do NOT wear cologne, perfume, aftershave, or lotions (deodorant is allowed). Please arrive 15 minutes prior to your appointment time.  Follow-Up in:   Your physician recommends that you schedule a follow-up appointment in: 3 months  Genetic test has been done, this has to be sent to [place] to be processed and can take 1-2 weeks to get results back.  We will let you know the results.    Do the following things EVERYDAY: Weigh yourself in the morning before breakfast. Write it down and keep it in a log. Take your medicines as prescribed Eat low salt foods--Limit salt (sodium) to 2000 mg per day.  Stay as active as you can everyday Limit all fluids for the day to less than 2 liters    Need to Contact us:  If you have any questions or concerns before your next appointment please send [place] a message through [place] or call our office at [phone].    TO LEAVE A MESSAGE FOR THE NURSE SELECT OPTION 2, PLEASE LEAVE A MESSAGE INCLUDING: YOUR NAME DATE OF BIRTH CALL BACK NUMBER REASON FOR CALL**this is important as we prioritize the call backs  YOU WILL RECEIVE A CALL BACK THE SAME DAY AS LONG AS YOU CALL BEFORE 4:00 PM   At the [facility], you and your health needs are our priority. As part of our continuing mission to provide you with exceptional heart care, we have created designated Provider Care Teams. These Care Teams include your primary Cardiologist (physician) and  Advanced Practice Providers (APPs- Physician Assistants and Nurse Practitioners) who all work together to provide you with the care you need, when you need it.   You may see any of the following providers on your designated Care Team at your next follow up: Dr Arvilla Meres Dr Marca Ancona Dr. Marcos Eke, NP Robbie Lis, [place] [facility] [place], [place] Brynda Peon, NP Karle Plumber, PharmD   Please be sure to bring in all your medications bottles to every appointment.    Thank you for choosing [facility]

## 2022-05-01 NOTE — Progress Notes (Signed)
ADVANCED HEART FAILURE CLINIC NOTE  Referring Physician: Georganna Skeans, MD  Primary Care: Georganna Skeans, MD  HPI: Russell Baldwin is a 52 y.o. male who presents for initial visit for further evaluation and treatment of heart failure/cardiomyopathy. 52 y/o AAM w/ h/o CAD s/p acute inferior wall STEMI in 2012, at the age of 31, c/b transient heart block necessitating TVP. Emergent cath showed severe single vessel disease w/ pRCA occlusion, treated w/ PCI + DES.  EF then was 45-50%.Admitted 9/22 w/ NSTEMI and CHF. Echo showed EF back down, 40-45%, RV normal. LHC showed severe single-vessel disease with CTO of the proximal to mid RCA just after previously placed stent -> distal vessel fills via bridging collaterals and right to right collaterals and large draping left coronary system, but no left-to-right collaterals. After reviewing images with Dr. Kirke Corin and Dr. Geralynn Rile as well as Dr. 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 Vessel not intervened on. Medical management elected. Admitted 1/24 w/ a/c CHF, COVID 19 infection and acute PE. LE Venous Dopplers were negative for DVT. Echo showed further reduction in EF and biventricular dysfunction.  EF down to 20-25%, RV severely reduced, mod enlarged, RVSP 37 mmHg. Mod-severe LVH also noted. Chest CT no mediastinal adenopathy. LHC deferred given absence of chest pain and only minimal Hs trop elevation, peaked at 109. He was treated w/ Eliquis, diuresed w/ IV Lasix and placed on GDMT. Discharged on 1/10. Referred to [facility] clinic.    Interval hx: Doing great functionally. He played 18 holes of golf today with virtually no lightheadedness, SOB. He no longer has LE edema. Very compliant with medications.    Activity level/exercise tolerance: NYHA II Orthopnea:  Sleeps on 1 pillows Paroxysmal noctural dyspnea:  no Chest pain/pressure:  no Orthostatic lightheadedness:  no Palpitations:  no Lower extremity edema:   no Presyncope/syncope:  no Cough:  no  Past Medical History:  Diagnosis Date   Coronary artery disease    a. 2012: inferior STEMI s/p DES to RCA,  b. 2016 inferior STEMI for late ISR s/p DES to RCA, c. LHC 09/2020 severe single vessel CAD with 100% stenosis of mid RCA with bridging and collaterals (felt to be CTO - treated medically)   HLD (hyperlipidemia)    HTN (hypertension)    Ischemic cardiomyopathy    a. LV gram EF 40% (03/2014); normal EF by echo   Prediabetes     Current Outpatient Medications  Medication Sig Dispense Refill   apixaban (ELIQUIS) 5 MG TABS tablet Take 1 tablet (5 mg total) by mouth 2 (two) times daily. 60 tablet 3   atorvastatin (LIPITOR) 80 MG tablet Take 1 tablet (80 mg total) by mouth daily at 6 PM. 30 tablet 6   carvedilol (COREG) 12.5 MG tablet Take 1 tablet (12.5 mg total) by mouth 2 (two) times daily with a meal. 60 tablet 3   furosemide (LASIX) 20 MG tablet Take 1 tablet (20 mg total) by mouth 3 (three) times a week. Every Mon, Wed, and Fri 15 tablet 3   nitroGLYCERIN (NITROSTAT) 0.4 MG SL tablet Place 1 tablet (0.4 mg total) under the tongue every 5 (five) minutes as needed for chest pain. 25 tablet 1   sacubitril-valsartan (ENTRESTO) 97-103 MG Take 1 tablet by mouth 2 (two) times daily. 60 tablet 11   spironolactone (ALDACTONE) 25 MG tablet Take 1 tablet (25 mg total) by mouth daily. 30 tablet 3   No current facility-administered medications for this  visit.    No Known Allergies    Social History   Socioeconomic History   Marital status: Single    Spouse name: Not on file   Number of children: 2   Years of education: Not on file   Highest education level: High school graduate  Occupational History   Occupation: Manufacturing systems engineer and [employer]  Tobacco Use   Smoking status: Never   Smokeless tobacco: Never  Vaping Use   Vaping Use: Never used  Substance and Sexual Activity   Alcohol use: Yes    Comment: socially   Drug use: No   Sexual  activity: Not on file  Other Topics Concern   Not on file  Social History Narrative   Not on file   Social Determinants of Health   Financial Resource Strain: Medium Risk (01/08/2022)   Overall Financial Resource Strain (CARDIA)    Difficulty of Paying Living Expenses: Somewhat hard  Food Insecurity: No Food Insecurity (01/06/2022)   Hunger Vital Sign    Worried About Running Out of Food in the Last Year: Never true    Ran Out of Food in the Last Year: Never true  Transportation Needs: No Transportation Needs (01/06/2022)   PRAPARE - Administrator, Civil Service (Medical): No    Lack of Transportation (Non-Medical): No  Physical Activity: Not on file  Stress: Not on file  Social Connections: Not on file  Intimate Partner Violence: Not At Risk (01/06/2022)   Humiliation, Afraid, Rape, and Kick questionnaire    Fear of Current or Ex-Partner: No    Emotionally Abused: No    Physically Abused: No    Sexually Abused: No      Family History  Problem Relation Age of Onset   Heart disease Mother    Diabetes Mother    Diabetes Father    Heart disease Father     PHYSICAL EXAM: Vitals:   05/01/22 1359  BP: 120/80  Pulse: 78  SpO2: 97%   GENERAL: Well nourished, well developed, and in no apparent distress at rest.  HEENT: Negative for arcus senilis or xanthelasma. There is no scleral icterus.  The mucous membranes are pink and moist.   NECK: Supple, No masses. Normal carotid upstrokes without bruits. No masses or thyromegaly.    CHEST: There are no chest wall deformities. There is no chest wall tenderness. Respirations are unlabored.  Lungs- CTA B/L CARDIAC:  JVP: 7 cm          Normal rate with regular rhythm. No murmurs, rubs or gallops.  Pulses are 2+ and symmetrical in upper and lower extremities. No edema.  ABDOMEN: Soft, non-tender, non-distended. There are no masses or hepatomegaly. There are normal bowel sounds.  EXTREMITIES: Warm and well perfused with no  cyanosis, clubbing.  LYMPHATIC: No axillary or supraclavicular lymphadenopathy.  NEUROLOGIC: Patient is oriented x3 with no focal or lateralizing neurologic deficits.  PSYCH: Patients affect is appropriate, there is no evidence of anxiety or depression.  SKIN: Warm and dry; no lesions or wounds.    DATA REVIEW  ECG:NSR w/ inferolateral ST-T inversions (01/19/22)  ECHO:LVEF 20-25%, moderate to severe LVH, moderately enlarged RV.   CATH: 09/20/20:  Severe single-vessel disease with CTO of the proximal to mid RCA just after previously placed stent -> distal vessel fills via bridging collaterals and right to right collaterals. After reviewing images with Dr. Kary Kos and Dr. Geralynn Rile as well as Dr. 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 ->   Large draping left coronary system, but no left-to-right collaterals. Mild-moderately elevated LVEDP of 18 mmHg.   ASSESSMENT & PLAN:  Heart failure with reduced ejection fraction Etiology of HF: I believe his heart failure is multifactorial in etiology.  He had a large posterior STEMI in 2012 however that does not explain the degree of severe global hypokinesis with concurrent severe LVH.  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  Reports long history of hypertension.  Of note he did have reduction in biventricular function after COVID-19 diagnosis and acute PE so this could be exacerbated by stress cardiomyopathy.  Plan for PYP scan and genetic testing today due to age of onset and severity of heart failure.  NYHA class / AHA Stage: NYHA II Volume status & Diuretics: Taking lasix 20mg  every other day. Euvolemic today. Vasodilators: Increase Entresto to 97/103mg  BID Beta-Blocker: Continue coreg 12.5mg  BID; limited by heart rate.  MRA: Spironolactone 25 mg daily Cardiometabolic: Jardiance 10 mg daily Devices therapies & Valvulopathies: CMR pending. Repeat echo at next follow up.  Advanced  therapies: Not currently indicated.  2 hypertension - Entresto 97/103mg  BID and Coreg 12.5 mg twice daily. - well controlled  3. CAD -CTO of the RCA at previously placed stent.  Distal vessel filled by bridging collaterals. - Lipitor 80 mg, aspirin 81 mg.  Aerielle Stoklosa Advanced Heart Failure Mechanical Circulatory Support

## 2022-05-04 ENCOUNTER — Encounter (HOSPITAL_COMMUNITY): Payer: No Typology Code available for payment source | Admitting: Cardiology

## 2022-06-06 ENCOUNTER — Other Ambulatory Visit (HOSPITAL_COMMUNITY): Payer: Self-pay | Admitting: Cardiology

## 2022-06-15 ENCOUNTER — Other Ambulatory Visit (HOSPITAL_COMMUNITY): Payer: Self-pay | Admitting: Cardiology

## 2022-06-15 MED ORDER — ENTRESTO 97-103 MG PO TABS: 1.0000 | ORAL_TABLET | Freq: Two times a day (BID) | ORAL | 11 refills | Status: DC

## 2022-06-15 MED ORDER — SPIRONOLACTONE 25 MG PO TABS: 25.0000 mg | ORAL_TABLET | Freq: Every day | ORAL | 6 refills | Status: DC

## 2022-06-15 MED ORDER — FUROSEMIDE 20 MG PO TABS: 20.0000 mg | ORAL_TABLET | ORAL | 3 refills | Status: DC

## 2022-06-15 MED ORDER — CARVEDILOL 12.5 MG PO TABS: 12.5000 mg | ORAL_TABLET | Freq: Two times a day (BID) | ORAL | 6 refills | Status: DC

## 2022-06-15 MED ORDER — ATORVASTATIN CALCIUM 80 MG PO TABS: 80.0000 mg | ORAL_TABLET | Freq: Every day | ORAL | 6 refills | Status: DC

## 2022-06-15 MED ORDER — APIXABAN 5 MG PO TABS: 5.0000 mg | ORAL_TABLET | Freq: Two times a day (BID) | ORAL | 6 refills | Status: DC

## 2022-06-22 ENCOUNTER — Telehealth (HOSPITAL_COMMUNITY): Payer: Self-pay | Admitting: Cardiology

## 2022-06-22 MED ORDER — EMPAGLIFLOZIN 10 MG PO TABS: 10.0000 mg | ORAL_TABLET | Freq: Every day | ORAL | 11 refills | Status: AC

## 2022-06-22 NOTE — Telephone Encounter (Signed)
Patient recently changed pharmacies and all meds sent except jardiance Per Dr Gasper Lloyd 05/01/22 Continue jaridance 10 daily Unsure how med was removed from acitve med list  ??? script expired with [facility] pharmacy 02/10/22 Will update script to new pharmacy

## 2022-06-25 ENCOUNTER — Telehealth (HOSPITAL_COMMUNITY): Payer: Self-pay

## 2022-06-25 ENCOUNTER — Other Ambulatory Visit (HOSPITAL_COMMUNITY): Payer: Self-pay

## 2022-06-25 NOTE — Telephone Encounter (Signed)
Advanced Heart Failure Patient Advocate Encounter  Prior authorization for [place] has been submitted and approved. Test billing returns refill too soon rejection, unable to verify copay at this time.  Key: [id number] Effective: 06/25/2022 to 06/24/2023  [name], CPhT Rx Patient Advocate Phone: [phone]

## 2022-07-16 ENCOUNTER — Other Ambulatory Visit (HOSPITAL_COMMUNITY): Payer: Self-pay | Admitting: Cardiology

## 2022-07-16 ENCOUNTER — Other Ambulatory Visit (HOSPITAL_COMMUNITY): Payer: Self-pay | Admitting: Emergency Medicine

## 2022-07-16 ENCOUNTER — Encounter (HOSPITAL_COMMUNITY): Payer: Self-pay | Admitting: Cardiology

## 2022-07-16 ENCOUNTER — Ambulatory Visit (HOSPITAL_BASED_OUTPATIENT_CLINIC_OR_DEPARTMENT_OTHER)
Admission: RE | Admit: 2022-07-16 | Discharge: 2022-07-16 | Disposition: A | Discharge status: Home / Self Care | Payer: 59 | Source: Ambulatory Visit | Attending: Cardiology | Admitting: Cardiology

## 2022-07-16 ENCOUNTER — Inpatient Hospital Stay (HOSPITAL_COMMUNITY)
Admission: RE | Admit: 2022-07-16 | Discharge: 2022-07-16 | Disposition: A | Discharge status: Home / Self Care | Payer: 59 | Source: Ambulatory Visit | Attending: Cardiology | Admitting: Cardiology

## 2022-07-16 ENCOUNTER — Ambulatory Visit (HOSPITAL_COMMUNITY)
Admission: RE | Admit: 2022-07-16 | Discharge: 2022-07-16 | Disposition: A | Discharge status: Home / Self Care | Payer: 59 | Source: Ambulatory Visit | Attending: Cardiology | Admitting: Cardiology

## 2022-07-16 VITALS — BP 122/80 | HR 51 | Wt 186.0 lb

## 2022-07-16 DIAGNOSIS — I5042 Chronic combined systolic (congestive) and diastolic (congestive) heart failure: Secondary | ICD-10-CM | POA: Insufficient documentation

## 2022-07-16 DIAGNOSIS — I429 Cardiomyopathy, unspecified: Secondary | ICD-10-CM | POA: Diagnosis not present

## 2022-07-16 DIAGNOSIS — I110 Hypertensive heart disease with heart failure: Secondary | ICD-10-CM | POA: Diagnosis not present

## 2022-07-16 DIAGNOSIS — I252 Old myocardial infarction: Secondary | ICD-10-CM | POA: Insufficient documentation

## 2022-07-16 DIAGNOSIS — I2510 Atherosclerotic heart disease of native coronary artery without angina pectoris: Secondary | ICD-10-CM | POA: Insufficient documentation

## 2022-07-16 DIAGNOSIS — Z86711 Personal history of pulmonary embolism: Secondary | ICD-10-CM | POA: Insufficient documentation

## 2022-07-16 DIAGNOSIS — Z79899 Other long term (current) drug therapy: Secondary | ICD-10-CM | POA: Insufficient documentation

## 2022-07-16 DIAGNOSIS — Z8616 Personal history of COVID-19: Secondary | ICD-10-CM | POA: Insufficient documentation

## 2022-07-16 DIAGNOSIS — I493 Ventricular premature depolarization: Secondary | ICD-10-CM

## 2022-07-16 DIAGNOSIS — Z7984 Long term (current) use of oral hypoglycemic drugs: Secondary | ICD-10-CM | POA: Insufficient documentation

## 2022-07-16 DIAGNOSIS — I255 Ischemic cardiomyopathy: Secondary | ICD-10-CM | POA: Diagnosis not present

## 2022-07-16 DIAGNOSIS — Z955 Presence of coronary angioplasty implant and graft: Secondary | ICD-10-CM | POA: Insufficient documentation

## 2022-07-16 DIAGNOSIS — R7303 Prediabetes: Secondary | ICD-10-CM | POA: Insufficient documentation

## 2022-07-16 DIAGNOSIS — I4729 Other ventricular tachycardia: Secondary | ICD-10-CM | POA: Insufficient documentation

## 2022-07-16 DIAGNOSIS — E785 Hyperlipidemia, unspecified: Secondary | ICD-10-CM | POA: Insufficient documentation

## 2022-07-16 DIAGNOSIS — Z7901 Long term (current) use of anticoagulants: Secondary | ICD-10-CM | POA: Insufficient documentation

## 2022-07-16 LAB — HEMOGLOBIN A1C
Hgb A1c MFr Bld: 6.5 % — ABNORMAL HIGH (ref 4.8–5.6)
Mean Plasma Glucose: 139.85 mg/dL

## 2022-07-16 LAB — BASIC METABOLIC PANEL
Anion gap: 8 (ref 5–15)
BUN: 12 mg/dL (ref 6–20)
CO2: 25 mmol/L (ref 22–32)
Calcium: 9 mg/dL (ref 8.9–10.3)
Chloride: 104 mmol/L (ref 98–111)
Creatinine, Ser: 1.22 mg/dL (ref 0.61–1.24)
GFR, Estimated: >60 mL/min (ref >60)
Glucose, Bld: 91 mg/dL (ref 70–99)
Potassium: 3.8 mmol/L (ref 3.5–5.1)
Sodium: 137 mmol/L (ref 135–145)

## 2022-07-16 LAB — ECHOCARDIOGRAM COMPLETE
AR max vel: 2.49 cm2
AV Area VTI: 2.56 cm2
AV Area mean vel: 2.32 cm2
AV Mean grad: 4 mmHg
AV Peak grad: 6.8 mmHg
Ao pk vel: 1.3 m/s
Area-P 1/2: 3.61 cm2
S' Lateral: 4.4 cm

## 2022-07-16 LAB — LIPID PANEL
Cholesterol: 165 mg/dL (ref 0–200)
HDL: 58 mg/dL (ref >40)
LDL Cholesterol: 96 mg/dL (ref 0–99)
Total CHOL/HDL Ratio: 2.8 RATIO
Triglycerides: 55 mg/dL (ref <150)
VLDL: 11 mg/dL (ref 0–40)

## 2022-07-16 LAB — BRAIN NATRIURETIC PEPTIDE: B Natriuretic Peptide: 74 pg/mL (ref 0.0–100.0)

## 2022-07-16 MED ORDER — PERFLUTREN LIPID MICROSPHERE
1.0000 mL | INTRAVENOUS | Status: DC | PRN
  Administered 2022-07-16: 3 mL via INTRAVENOUS

## 2022-07-16 NOTE — Progress Notes (Addendum)
ADVANCED HEART FAILURE CLINIC NOTE  Referring Physician: Georganna Skeans, MD  Primary Care: Georganna Skeans, MD  HPI: Russell Baldwin is a 52 y.o. male who presents for follow up. 52 y/o AAM w/ h/o CAD s/p acute inferior wall STEMI in 2012, at the age of 76, c/b transient heart block necessitating TVP. Emergent cath showed severe single vessel disease w/ pRCA occlusion, treated w/ PCI + DES.  EF then was 45-50%.Admitted 9/22 w/ NSTEMI and CHF. Echo showed EF back down, 40-45%, RV normal. LHC showed severe single-vessel disease with CTO of the proximal to mid RCA just after previously placed stent -> distal vessel fills via bridging collaterals and right to right collaterals and large draping left coronary system, but no left-to-right collaterals. After reviewing images with Dr. Kirke Corin and Dr. Geralynn Rile as well as Dr. 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 Vessel not intervened on. Medical management elected. Admitted 1/24 w/ a/c CHF, COVID 19 infection and acute PE. LE Venous Dopplers were negative for DVT. Echo showed further reduction in EF and biventricular dysfunction.  EF down to 20-25%, RV severely reduced, mod enlarged, RVSP 37 mmHg. Mod-severe LVH also noted. Chest CT no mediastinal adenopathy. LHC deferred given absence of chest pain and only minimal Hs trop elevation, peaked at 109. He was treated w/ Eliquis, diuresed w/ IV Lasix and placed on GDMT. Discharged on 1/10. Referred to [facility] clinic.    Interval hx: He walks 3-5 miles / 5 days per week. During this time he does not feel limited due to shortness of breath, chest pain or lightheadedness. He has now been weightlifting lower weights. No LE edema or PND/orthopnea.   Activity level/exercise tolerance: NYHA II Orthopnea:  Sleeps on 1 pillows Paroxysmal noctural dyspnea:  no Chest pain/pressure:  no Orthostatic lightheadedness:  no Palpitations:  no Lower extremity edema:  no Presyncope/syncope:   no Cough:  no  Past Medical History:  Diagnosis Date   Coronary artery disease    a. 2012: inferior STEMI s/p DES to RCA,  b. 2016 inferior STEMI for late ISR s/p DES to RCA, c. LHC 09/2020 severe single vessel CAD with 100% stenosis of mid RCA with bridging and collaterals (felt to be CTO - treated medically)   HLD (hyperlipidemia)    HTN (hypertension)    Ischemic cardiomyopathy    a. LV gram EF 40% (03/2014); normal EF by echo   Prediabetes     Current Outpatient Medications  Medication Sig Dispense Refill   apixaban (ELIQUIS) 5 MG TABS tablet Take 1 tablet (5 mg total) by mouth 2 (two) times daily. 60 tablet 6   atorvastatin (LIPITOR) 80 MG tablet Take 1 tablet (80 mg total) by mouth daily at 6 PM. 30 tablet 6   carvedilol (COREG) 12.5 MG tablet Take 1 tablet (12.5 mg total) by mouth 2 (two) times daily with a meal. 60 tablet 6   empagliflozin (JARDIANCE) 10 MG TABS tablet Take 1 tablet (10 mg total) by mouth daily before breakfast. 30 tablet 11   furosemide (LASIX) 20 MG tablet Take 1 tablet (20 mg total) by mouth 3 (three) times a week. Every Mon, Wed, and Fri 15 tablet 3   nitroGLYCERIN (NITROSTAT) 0.4 MG SL tablet Place 1 tablet (0.4 mg total) under the tongue every 5 (five) minutes as needed for chest pain. 25 tablet 1   sacubitril-valsartan (ENTRESTO) 97-103 MG Take 1 tablet by mouth 2 (two) times daily. 60 tablet 11  spironolactone (ALDACTONE) 25 MG tablet Take 1 tablet (25 mg total) by mouth daily. 30 tablet 6   No current facility-administered medications for this encounter.   Facility-Administered Medications Ordered in Other Encounters  Medication Dose Route Frequency Provider Last Rate Last Admin   perflutren lipid microspheres (DEFINITY) IV suspension  1-10 mL Intravenous PRN Vercie Pokorny, DO   3 mL at 07/16/22 0903    No Known Allergies    Social History   Socioeconomic History   Marital status: Single    Spouse name: Not on file   Number of children: 2    Years of education: Not on file   Highest education level: High school graduate  Occupational History   Occupation: Manufacturing systems engineer and [employer]  Tobacco Use   Smoking status: Never   Smokeless tobacco: Never  Vaping Use   Vaping status: Never Used  Substance and Sexual Activity   Alcohol use: Yes    Comment: socially   Drug use: No   Sexual activity: Not on file  Other Topics Concern   Not on file  Social History Narrative   Not on file   Social Determinants of Health   Financial Resource Strain: Medium Risk (01/08/2022)   Overall Financial Resource Strain (CARDIA)    Difficulty of Paying Living Expenses: Somewhat hard  Food Insecurity: No Food Insecurity (01/06/2022)   Hunger Vital Sign    Worried About Running Out of Food in the Last Year: Never true    Ran Out of Food in the Last Year: Never true  Transportation Needs: No Transportation Needs (01/06/2022)   PRAPARE - Administrator, Civil Service (Medical): No    Lack of Transportation (Non-Medical): No  Physical Activity: Not on file  Stress: Not on file  Social Connections: Not on file  Intimate Partner Violence: Not At Risk (01/06/2022)   Humiliation, Afraid, Rape, and Kick questionnaire    Fear of Current or Ex-Partner: No    Emotionally Abused: No    Physically Abused: No    Sexually Abused: No      Family History  Problem Relation Age of Onset   Heart disease Mother    Diabetes Mother    Diabetes Father    Heart disease Father     PHYSICAL EXAM: Vitals:   07/16/22 0917  BP: 122/80  Pulse: (!) 51  SpO2: 98%   GENERAL: Well nourished, well developed, and in no apparent distress at rest.  HEENT: Negative for arcus senilis or xanthelasma. There is no scleral icterus.  The mucous membranes are pink and moist.   NECK: Supple, No masses. Normal carotid upstrokes without bruits. No masses or thyromegaly.    CHEST: There are no chest wall deformities. There is no chest wall tenderness.  Respirations are unlabored.  Lungs- CTA B/L CARDIAC:  JVP: 7 cm          Normal rate with regular rhythm. No murmurs, rubs or gallops.  Pulses are 2+ and symmetrical in upper and lower extremities. No edema.  ABDOMEN: Soft, non-tender, non-distended. There are no masses or hepatomegaly. There are normal bowel sounds.  EXTREMITIES: Warm and well perfused with no cyanosis, clubbing.  LYMPHATIC: No axillary or supraclavicular lymphadenopathy.  NEUROLOGIC: Patient is oriented x3 with no focal or lateralizing neurologic deficits.  PSYCH: Patients affect is appropriate, there is no evidence of anxiety or depression.  SKIN: Warm and dry; no lesions or wounds.     DATA REVIEW  ECG:NSR w/ inferolateral  ST-T inversions (01/19/22) 07/16/22: NSR  ECHO:LVEF 20-25%, moderate to severe LVH, moderately enlarged RV.   CATH: 09/20/20:  Severe single-vessel disease with CTO of the proximal to mid RCA just after previously placed stent -> distal vessel fills via bridging collaterals and right to right collaterals. After reviewing images with Dr. Kary Kos and Dr. Geralynn Rile as well as Dr. 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 ->   Large draping left coronary system, but no left-to-right collaterals. Mild-moderately elevated LVEDP of 18 mmHg.   ASSESSMENT & PLAN:  Heart failure with reduced ejection fraction Etiology of HF: I believe his heart failure is multifactorial in etiology.  He had a large posterior STEMI in 2012 however that does not explain the degree of severe global hypokinesis with concurrent severe LVH.  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  Reports long history of hypertension.  Of note he did have reduction in biventricular function after COVID-19 diagnosis and acute PE so this could be exacerbated by stress cardiomyopathy.  Plan for CMR.  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   Ziopatch for 2 weeks to assess for ectopy.   NYHA class / AHA Stage: NYHA II Volume status & Diuretics: Taking lasix 20mg  every other day. Euvolemic today. Vasodilators: Continue Entresto to 97/103mg  BID Beta-Blocker: Continue coreg 12.5mg  BID; limited by heart rate.  MRA: Spironolactone 25 mg daily Cardiometabolic: Jardiance 10 mg daily Devices therapies & Valvulopathies: CMR pending.TTE today with mild improvement in LVEF.  Advanced therapies: Not currently indicated.  2 hypertension - Entresto 97/103mg  BID and Coreg 12.5 mg twice daily. - well controlled - repeat labs today.   3. CAD -CTO of the RCA at previously placed stent.  Distal vessel filled by bridging collaterals. - Lipitor 80 mg, aspirin 81 mg.  Virgina Deakins Advanced Heart Failure Mechanical Circulatory Support

## 2022-07-16 NOTE — Patient Instructions (Signed)
Medication Changes:  None, Continue current medications  Lab Work:  Labs done today, your results will be available in MyChart, we will contact you for abnormal readings.  Testing/Procedures:  Your provider has recommended that  you wear a Zio Patch for 14 days.  This monitor will record your heart rhythm for our review.  IF you have any symptoms while wearing the monitor please press the button.  If you have any issues with the patch or you notice a red or orange light on it please call the company at [phone].  Once you remove the patch please mail it back to the company as soon as possible so we can get the results.  Cardiac MRI has been ordered, you will be called to schedule this, please see instructions below.  Special Instructions // Education:  Do the following things EVERYDAY: Weigh yourself in the morning before breakfast. Write it down and keep it in a log. Take your medicines as prescribed Eat low salt foods--Limit salt (sodium) to 2000 mg per day.  Stay as active as you can everyday Limit all fluids for the day to less than 2 liters  Cardiac MRI: [facility] [address] [place], [place] [address] [phone] Please take advantage of the free valet parking available at the [facility] and [employer] (Entrance C).  Proceed to the [facility] Radiology Department (First Floor) for check-in.   Magnetic resonance imaging (MRI) is a painless test that produces images of the inside of the body without using Xrays.  During an MRI, strong magnets and radio waves work together in a Data processing manager to form detailed images.   MRI images may provide more details about a medical condition than X-rays, CT scans, and ultrasounds can provide.  You may be given earphones to listen for instructions.  You may eat a light breakfast and take medications as ordered:  DO NOT TAKE FUROSEMIDE, SPIRONOLACTONE, OR JARDIACE AM OF TEST.  Please avoid stimulants for  12 hr prior to test. (Ie. Caffeine, nicotine, chocolate, or antihistamine medications)  If a contrast material will be used, an IV will be inserted into one of your veins. Contrast material will be injected into your IV. It will leave your body through your urine within a day. You may be told to drink plenty of fluids to help flush the contrast material out of your system.  You will be asked to remove all metal, including: Watch, jewelry, and other metal objects including hearing aids, hair pieces and dentures. Also wearable glucose monitoring systems (ie. Freestyle Libre and Omnipods) (Braces and fillings normally are not a problem.)  TEST WILL TAKE APPROXIMATELY 1 HOUR  PLEASE NOTIFY SCHEDULING AT LEAST 24 HOURS IN ADVANCE IF YOU ARE UNABLE TO KEEP YOUR APPOINTMENT. [phone]  For more information and frequently asked questions, please visit our website : [email or web address]  Please call Rockwell Alexandria, cardiac imaging nurse navigator with any questions/concerns. Rockwell Alexandria RN Navigator Cardiac Imaging Larey Brick RN Navigator Cardiac Imaging [facility] [phone] Office   Follow-Up in: 2-3 MONTHS  At the [facility], you and your health needs are our priority. We have a designated team specialized in the treatment of Heart Failure. This Care Team includes your primary Heart Failure Specialized Cardiologist (physician), Advanced Practice Providers (APPs- Physician Assistants and Nurse Practitioners), and Pharmacist who all work together to provide you with the care you need, when you need it.   You may see any of  the following providers on your designated Care Team at your next follow up:  Dr. Arvilla Meres Dr. Marca Ancona Dr. Dorthula Nettles Tonye Becket, NP Robbie Lis, [place] [facility] [place], [place] Brynda Peon, NP Karle Plumber, PharmD   Please be sure to bring in all your medications bottles  to every appointment.   Need to Contact us:  If you have any questions or concerns before your next appointment please send [place] a message through [place] or call our office at [phone].    TO LEAVE A MESSAGE FOR THE NURSE SELECT OPTION 2, PLEASE LEAVE A MESSAGE INCLUDING: YOUR NAME DATE OF BIRTH CALL BACK NUMBER REASON FOR CALL**this is important as we prioritize the call backs  YOU WILL RECEIVE A CALL BACK THE SAME DAY AS LONG AS YOU CALL BEFORE 4:00 PM

## 2022-08-06 NOTE — Addendum Note (Signed)
Encounter addended by: Crissie Figures, RN on: 08/06/2022 3:40 PM  Actions taken: Imaging Exam ended

## 2022-08-09 ENCOUNTER — Telehealth (HOSPITAL_COMMUNITY): Payer: Self-pay | Admitting: *Deleted

## 2022-08-20 ENCOUNTER — Telehealth (HOSPITAL_COMMUNITY): Payer: Self-pay | Admitting: *Deleted

## 2022-08-20 NOTE — Telephone Encounter (Signed)
 Attempted to call patient regarding upcoming cardiac MRI appointment. Left message on voicemail with name and callback number Johney Frame RN Navigator Cardiac Imaging [facility] [phone] Office  Instructions sent via Mychart.

## 2022-08-21 ENCOUNTER — Ambulatory Visit (HOSPITAL_COMMUNITY)
Admission: RE | Admit: 2022-08-21 | Discharge: 2022-08-21 | Disposition: A | Discharge status: Home / Self Care | Payer: 59 | Source: Ambulatory Visit | Attending: Cardiology | Admitting: Cardiology

## 2022-08-21 ENCOUNTER — Other Ambulatory Visit (HOSPITAL_COMMUNITY): Payer: Self-pay | Admitting: Cardiology

## 2022-08-21 DIAGNOSIS — I5022 Chronic systolic (congestive) heart failure: Secondary | ICD-10-CM

## 2022-08-21 MED ORDER — GADOBUTROL 1 MMOL/ML IV SOLN
10.0000 mL | Freq: Once | INTRAVENOUS | Status: AC | PRN
  Administered 2022-08-21: 10 mL via INTRAVENOUS

## 2022-09-20 ENCOUNTER — Ambulatory Visit (HOSPITAL_COMMUNITY)
Admission: RE | Admit: 2022-09-20 | Discharge: 2022-09-20 | Disposition: A | Discharge status: Home / Self Care | Payer: 59 | Source: Ambulatory Visit | Attending: Cardiology | Admitting: Cardiology

## 2022-09-20 ENCOUNTER — Encounter (HOSPITAL_COMMUNITY): Payer: Self-pay | Admitting: Cardiology

## 2022-09-20 VITALS — BP 140/80 | HR 55 | Wt 204.2 lb

## 2022-09-20 DIAGNOSIS — E7800 Pure hypercholesterolemia, unspecified: Secondary | ICD-10-CM

## 2022-09-20 DIAGNOSIS — Z8249 Family history of ischemic heart disease and other diseases of the circulatory system: Secondary | ICD-10-CM | POA: Insufficient documentation

## 2022-09-20 DIAGNOSIS — I252 Old myocardial infarction: Secondary | ICD-10-CM | POA: Diagnosis not present

## 2022-09-20 DIAGNOSIS — E785 Hyperlipidemia, unspecified: Secondary | ICD-10-CM | POA: Insufficient documentation

## 2022-09-20 DIAGNOSIS — Z5986 Financial insecurity: Secondary | ICD-10-CM | POA: Insufficient documentation

## 2022-09-20 DIAGNOSIS — Z79899 Other long term (current) drug therapy: Secondary | ICD-10-CM | POA: Insufficient documentation

## 2022-09-20 DIAGNOSIS — I5022 Chronic systolic (congestive) heart failure: Secondary | ICD-10-CM | POA: Diagnosis not present

## 2022-09-20 DIAGNOSIS — Z86711 Personal history of pulmonary embolism: Secondary | ICD-10-CM | POA: Diagnosis not present

## 2022-09-20 DIAGNOSIS — Z7984 Long term (current) use of oral hypoglycemic drugs: Secondary | ICD-10-CM | POA: Diagnosis not present

## 2022-09-20 DIAGNOSIS — I110 Hypertensive heart disease with heart failure: Secondary | ICD-10-CM | POA: Diagnosis not present

## 2022-09-20 DIAGNOSIS — I255 Ischemic cardiomyopathy: Secondary | ICD-10-CM | POA: Diagnosis present

## 2022-09-20 DIAGNOSIS — I2510 Atherosclerotic heart disease of native coronary artery without angina pectoris: Secondary | ICD-10-CM | POA: Diagnosis not present

## 2022-09-20 LAB — LIPID PANEL
Cholesterol: 169 mg/dL (ref 0–200)
HDL: 58 mg/dL (ref >40)
LDL Cholesterol: 99 mg/dL (ref 0–99)
Total CHOL/HDL Ratio: 2.9 RATIO
Triglycerides: 59 mg/dL (ref <150)
VLDL: 12 mg/dL (ref 0–40)

## 2022-09-20 LAB — BASIC METABOLIC PANEL
Anion gap: 9 (ref 5–15)
BUN: 12 mg/dL (ref 6–20)
CO2: 25 mmol/L (ref 22–32)
Calcium: 9 mg/dL (ref 8.9–10.3)
Chloride: 106 mmol/L (ref 98–111)
Creatinine, Ser: 1.26 mg/dL — ABNORMAL HIGH (ref 0.61–1.24)
GFR, Estimated: >60 mL/min (ref >60)
Glucose, Bld: 110 mg/dL — ABNORMAL HIGH (ref 70–99)
Potassium: 3.9 mmol/L (ref 3.5–5.1)
Sodium: 140 mmol/L (ref 135–145)

## 2022-09-20 LAB — BRAIN NATRIURETIC PEPTIDE: B Natriuretic Peptide: 96 pg/mL (ref 0.0–100.0)

## 2022-09-20 NOTE — Patient Instructions (Signed)
Medication Changes:  STOP: APIXABAN (ELIQUIS)  Lab Work:  Labs done today, your results will be available in MyChart, we will contact you for abnormal readings.  Testing/Procedures:  Your physician has requested that you have an echocardiogram in 4 months. Echocardiography is a painless test that uses sound waves to create images of your heart. It provides your doctor with information about the size and shape of your heart and how well your heart's chambers and valves are working. This procedure takes approximately one hour. There are no restrictions for this procedure. Please do NOT wear cologne, perfume, aftershave, or lotions (deodorant is allowed). Please arrive 15 minutes prior to your appointment time.  Follow-Up in: in 4 months with Dr. Gasper Baldwin PLEASE CALL OUR OFFICE AROUND NOVEMBER TO GET SCHEDULED FOR YOUR APPOINTMENT. PHONE NUMBER IS [phone] OPTION 2    At the [facility], you and your health needs are our priority. We have a designated team specialized in the treatment of Heart Failure. This Care Team includes your primary Heart Failure Specialized Cardiologist (physician), Advanced Practice Providers (APPs- Physician Assistants and Nurse Practitioners), and Pharmacist who all work together to provide you with the care you need, when you need it.   You may see any of the following providers on your designated Care Team at your next follow up:  Dr. Arvilla Meres Dr. Marca Ancona Dr. Marcos Eke, NP Robbie Lis, [place] [facility] [place], [place] Brynda Peon, NP Karle Plumber, PharmD   Please be sure to bring in all your medications bottles to every appointment.   Need to Contact us:  If you have any questions or concerns before your next appointment please send [place] a message through [place] or call our office at [phone].    TO LEAVE A MESSAGE FOR THE NURSE SELECT OPTION 2, PLEASE LEAVE A MESSAGE INCLUDING: YOUR  NAME DATE OF BIRTH CALL BACK NUMBER REASON FOR CALL**this is important as we prioritize the call backs  YOU WILL RECEIVE A CALL BACK THE SAME DAY AS LONG AS YOU CALL BEFORE 4:00 PM

## 2022-09-20 NOTE — Progress Notes (Signed)
ADVANCED HEART FAILURE CLINIC NOTE  Referring Physician: Georganna Skeans, MD  Primary Care: Georganna Skeans, MD  HPI: Russell Baldwin is a 52 y.o. male who presents for follow up. 53 y/o AAM w/ h/o CAD s/p acute inferior wall STEMI in 2012, at the age of 50, c/b transient heart block necessitating TVP. Emergent cath showed severe single vessel disease w/ pRCA occlusion, treated w/ PCI + DES.  EF then was 45-50%.Admitted 9/22 w/ NSTEMI and CHF. Echo showed EF back down, 40-45%, RV normal. LHC showed severe single-vessel disease with CTO of the proximal to mid RCA just after previously placed stent -> distal vessel fills via bridging collaterals and right to right collaterals and large draping left coronary system, but no left-to-right collaterals. After reviewing images with Dr. Kirke Corin and Dr. Geralynn Rile as well as Dr. 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 Vessel not intervened on. Medical management elected. Admitted 1/24 w/ a/c CHF, COVID 19 infection and acute PE. LE Venous Dopplers were negative for DVT. Echo showed further reduction in EF and biventricular dysfunction.  EF down to 20-25%, RV severely reduced, mod enlarged, RVSP 37 mmHg. Mod-severe LVH also noted. Chest CT no mediastinal adenopathy. LHC deferred given absence of chest pain and only minimal Hs trop elevation, peaked at 109. He was treated w/ Eliquis, diuresed w/ IV Lasix and placed on GDMT. Discharged on 1/10. Referred to [facility] clinic.    Interval hx: He is now going frequently to the gym and lifting weights without difficulty.  From a functional standpoint has virtually no symptoms secondary to heart failure aside from very infrequent palpitations.  Activity level/exercise tolerance: NYHA II Orthopnea:  Sleeps on 1 pillows Paroxysmal noctural dyspnea:  no Chest pain/pressure:  no Orthostatic lightheadedness:  no Palpitations:  no Lower extremity edema:  no Presyncope/syncope:  no Cough:   no  Past Medical History:  Diagnosis Date   Coronary artery disease    a. 2012: inferior STEMI s/p DES to RCA,  b. 2016 inferior STEMI for late ISR s/p DES to RCA, c. LHC 09/2020 severe single vessel CAD with 100% stenosis of mid RCA with bridging and collaterals (felt to be CTO - treated medically)   HLD (hyperlipidemia)    HTN (hypertension)    Ischemic cardiomyopathy    a. LV gram EF 40% (03/2014); normal EF by echo   Prediabetes     Current Outpatient Medications  Medication Sig Dispense Refill   apixaban (ELIQUIS) 5 MG TABS tablet Take 1 tablet (5 mg total) by mouth 2 (two) times daily. 60 tablet 6   atorvastatin (LIPITOR) 80 MG tablet Take 1 tablet (80 mg total) by mouth daily at 6 PM. 30 tablet 6   carvedilol (COREG) 12.5 MG tablet Take 1 tablet (12.5 mg total) by mouth 2 (two) times daily with a meal. 60 tablet 6   empagliflozin (JARDIANCE) 10 MG TABS tablet Take 1 tablet (10 mg total) by mouth daily before breakfast. 30 tablet 11   furosemide (LASIX) 20 MG tablet Take 1 tablet (20 mg total) by mouth 3 (three) times a week. Every Mon, Wed, and Fri 15 tablet 3   nitroGLYCERIN (NITROSTAT) 0.4 MG SL tablet Place 1 tablet (0.4 mg total) under the tongue every 5 (five) minutes as needed for chest pain. 25 tablet 1   sacubitril-valsartan (ENTRESTO) 97-103 MG Take 1 tablet by mouth 2 (two) times daily. 60 tablet 11   spironolactone (ALDACTONE) 25 MG tablet Take 1  tablet (25 mg total) by mouth daily. 30 tablet 6   No current facility-administered medications for this encounter.    No Known Allergies    Social History   Socioeconomic History   Marital status: Single    Spouse name: Not on file   Number of children: 2   Years of education: Not on file   Highest education level: High school graduate  Occupational History   Occupation: Manufacturing systems engineer and [employer]  Tobacco Use   Smoking status: Never   Smokeless tobacco: Never  Vaping Use   Vaping status: Never Used  Substance  and Sexual Activity   Alcohol use: Yes    Comment: socially   Drug use: No   Sexual activity: Not on file  Other Topics Concern   Not on file  Social History Narrative   Not on file   Social Determinants of Health   Financial Resource Strain: Medium Risk (01/08/2022)   Overall Financial Resource Strain (CARDIA)    Difficulty of Paying Living Expenses: Somewhat hard  Food Insecurity: No Food Insecurity (01/06/2022)   Hunger Vital Sign    Worried About Running Out of Food in the Last Year: Never true    Ran Out of Food in the Last Year: Never true  Transportation Needs: No Transportation Needs (01/06/2022)   PRAPARE - Administrator, Civil Service (Medical): No    Lack of Transportation (Non-Medical): No  Physical Activity: Not on file  Stress: Not on file  Social Connections: Not on file  Intimate Partner Violence: Not At Risk (01/06/2022)   Humiliation, Afraid, Rape, and Kick questionnaire    Fear of Current or Ex-Partner: No    Emotionally Abused: No    Physically Abused: No    Sexually Abused: No      Family History  Problem Relation Age of Onset   Heart disease Mother    Diabetes Mother    Diabetes Father    Heart disease Father     PHYSICAL EXAM: Vitals:   09/20/22 0933  BP: (!) 140/80  Pulse: (!) 55  SpO2: 98%   GENERAL: Well nourished, well developed, and in no apparent distress at rest.  HEENT: Negative for arcus senilis or xanthelasma. There is no scleral icterus.  The mucous membranes are pink and moist.   NECK: Supple, No masses. Normal carotid upstrokes without bruits. No masses or thyromegaly.    CHEST: There are no chest wall deformities. There is no chest wall tenderness. Respirations are unlabored.  Lungs- CTA B/L CARDIAC:  JVP: 7 cm          Normal rate with regular rhythm. No murmurs, rubs or gallops.  Pulses are 2+ and symmetrical in upper and lower extremities. No edema.  ABDOMEN: Soft, non-tender, non-distended. There are no masses or  hepatomegaly. There are normal bowel sounds.  EXTREMITIES: Warm and well perfused with no cyanosis, clubbing.  LYMPHATIC: No axillary or supraclavicular lymphadenopathy.  NEUROLOGIC: Patient is oriented x3 with no focal or lateralizing neurologic deficits.  PSYCH: Patients affect is appropriate, there is no evidence of anxiety or depression.  SKIN: Warm and dry; no lesions or wounds.     DATA REVIEW  ECG:NSR w/ inferolateral ST-T inversions (01/19/22) 07/16/22: NSR  ECHO:LVEF 20-25%, moderate to severe LVH, moderately enlarged RV.   CATH: 09/20/20:  Severe single-vessel disease with CTO of the proximal to mid RCA just after previously placed stent -> distal vessel fills via bridging collaterals and right to right  collaterals. After reviewing images with Dr. Kary Kos and Dr. Geralynn Rile as well as Dr. 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 ->   Large draping left coronary system, but no left-to-right collaterals. Mild-moderately elevated LVEDP of 18 mmHg.   ASSESSMENT & PLAN:  Heart failure with reduced ejection fraction Etiology of HF: I believe his heart failure is multifactorial in etiology.  He had a large posterior STEMI in 2012 however that does not explain the degree of severe global hypokinesis with concurrent severe LVH.  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  Reports long history of hypertension.  Of note he did have reduction in biventricular function after COVID-19 diagnosis and acute PE so this could be exacerbated by stress cardiomyopathy.  Plan for CMR.  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   NYHA class / AHA Stage: NYHA II Volume status & Diuretics: Taking lasix 20mg  every other day.  Will decrease to as needed Vasodilators: Continue Entresto to 97/103mg  BID Beta-Blocker: Continue coreg 12.5mg  BID; limited by heart rate.  MRA: Spironolactone 25 mg  daily Cardiometabolic: Jardiance 10 mg daily Devices therapies & Valvulopathies: CMR w/ LVEF 38%.  Advanced therapies: Not currently indicated.  2 hypertension - Entresto 97/103mg  BID and Coreg 12.5 mg twice daily. - Hypertensive today; however, did not take medications today.   3. CAD -CTO of the RCA at previously placed stent.  Distal vessel filled by bridging collaterals. - Lipitor 80 mg, aspirin 81 mg.  4. Pulmonary embolism - In the setting of COVID in 1/24 - Stop eliquis today.   5. Hyperlipidemia  - LDL 96 in July 2024 - Will repeat lipid panel  Annye Forrey Advanced Heart Failure Mechanical Circulatory Support

## 2022-10-03 ENCOUNTER — Telehealth (HOSPITAL_COMMUNITY): Payer: Self-pay

## 2022-10-03 ENCOUNTER — Other Ambulatory Visit (HOSPITAL_COMMUNITY): Payer: Self-pay

## 2022-10-03 NOTE — Telephone Encounter (Signed)
Advanced Heart Failure Patient Advocate Encounter  Patient left message concerned about cost of Entresto. Attempted to contact patient about copay savings card. No answer, left voicemail.  [name], CPhT Rx Patient Advocate Phone: [phone]

## 2022-10-03 NOTE — Telephone Encounter (Signed)
Spoke to patient by phone, sounds as though he has been using the Entresto savings card, but it is now showing a copay of apx 280 after being split billed. Pt may have exceeded annual savings limit.  Will request samples be left at the office, and I will contact [employer] to see if pt may be eligible for assistance.

## 2022-10-04 ENCOUNTER — Other Ambulatory Visit (HOSPITAL_COMMUNITY): Payer: Self-pay

## 2022-10-04 NOTE — Telephone Encounter (Signed)
Medication Samples have been provided to the patient.  Drug name: entresto       Strength: 49/51        Qty: 56  LOT: [id number]  Exp.Date: 06/2023  Dosing instructions: TAKE TWO TABS TWICE A DAY  The patient has been instructed regarding the correct time, dose, and frequency of taking this medication, including desired effects and most common side effects.   Russell Baldwin 8:41 AM 10/04/2022

## 2022-10-04 NOTE — Telephone Encounter (Signed)
Contacted [employer] to inquire about assistance for a patient who has maxxed out the savings card. Representative stated that they are not accepting applications for any patients with commercial coverage at this time, but did confirm that funds for copay savings card will renew on Jan 1.  Left message for patient that samples are available in the clinic, that program funds will renew with the new year, and that he can reach out to me or the office to check for sample availability when needed

## 2022-10-08 ENCOUNTER — Other Ambulatory Visit (HOSPITAL_COMMUNITY): Payer: Self-pay

## 2022-10-08 ENCOUNTER — Other Ambulatory Visit: Payer: Self-pay

## 2022-10-09 ENCOUNTER — Encounter: Payer: Self-pay | Admitting: Pharmacist

## 2022-10-09 ENCOUNTER — Other Ambulatory Visit: Payer: Self-pay

## 2022-10-09 ENCOUNTER — Other Ambulatory Visit (HOSPITAL_COMMUNITY): Payer: Self-pay | Admitting: Cardiology

## 2022-10-09 ENCOUNTER — Other Ambulatory Visit (HOSPITAL_COMMUNITY): Payer: Self-pay

## 2022-10-09 MED ORDER — CARVEDILOL 12.5 MG PO TABS
12.5000 mg | ORAL_TABLET | Freq: Two times a day (BID) | ORAL | 6 refills | Status: DC
  Filled 2022-10-09: qty 60, 30d supply, fill #0

## 2022-10-12 ENCOUNTER — Other Ambulatory Visit: Payer: Self-pay

## 2022-11-06 ENCOUNTER — Other Ambulatory Visit (HOSPITAL_COMMUNITY): Payer: Self-pay

## 2022-11-17 ENCOUNTER — Other Ambulatory Visit (HOSPITAL_COMMUNITY): Payer: Self-pay | Admitting: Cardiology

## 2023-01-23 ENCOUNTER — Telehealth (HOSPITAL_COMMUNITY): Payer: Self-pay

## 2023-01-23 ENCOUNTER — Other Ambulatory Visit (HOSPITAL_COMMUNITY): Payer: Self-pay

## 2023-01-23 NOTE — Telephone Encounter (Signed)
Advanced Heart Failure Patient Advocate Encounter  Returned patient call regarding Entresto. Pt states that they called the pharmacy earlier today and were told that the rx is not active. Unsure of how / why.  I called pharmacy, confirmed that prescription is active and has refills. Pharmacy processed insurance and copay card, confirmed $10 copay.  Informed pt by phone.  [name], CPhT Rx Patient Advocate Phone: [phone]

## 2023-02-04 ENCOUNTER — Other Ambulatory Visit (HOSPITAL_COMMUNITY): Payer: Self-pay | Admitting: Cardiology

## 2023-03-01 ENCOUNTER — Other Ambulatory Visit (HOSPITAL_COMMUNITY): Payer: Self-pay | Admitting: Cardiology

## 2023-05-20 ENCOUNTER — Other Ambulatory Visit (HOSPITAL_COMMUNITY): Payer: Self-pay | Admitting: Cardiology

## 2023-06-25 ENCOUNTER — Other Ambulatory Visit (HOSPITAL_COMMUNITY): Payer: Self-pay | Admitting: Cardiology

## 2023-07-31 ENCOUNTER — Telehealth (HOSPITAL_COMMUNITY): Payer: Self-pay | Admitting: Cardiology
